# Patient Record
Sex: Female | Born: 1995 | Race: Black or African American | Hispanic: No | Marital: Married | Smoking: Former smoker
Health system: Southern US, Community
[De-identification: ages and names within clinical notes are randomized; demographics above are authoritative.]

## PROBLEM LIST (undated history)

## (undated) DIAGNOSIS — F419 Anxiety disorder, unspecified: Secondary | ICD-10-CM

## (undated) DIAGNOSIS — Z789 Other specified health status: Secondary | ICD-10-CM

## (undated) DIAGNOSIS — F4310 Post-traumatic stress disorder, unspecified: Secondary | ICD-10-CM

## (undated) DIAGNOSIS — N390 Urinary tract infection, site not specified: Secondary | ICD-10-CM

## (undated) DIAGNOSIS — F32A Depression, unspecified: Secondary | ICD-10-CM

## (undated) DIAGNOSIS — B009 Herpesviral infection, unspecified: Secondary | ICD-10-CM

## (undated) HISTORY — DX: Depression, unspecified: F32.A

## (undated) HISTORY — DX: Post-traumatic stress disorder, unspecified: F43.10

## (undated) HISTORY — DX: Herpesviral infection, unspecified: B00.9

## (undated) HISTORY — PX: HERNIA REPAIR: SHX51

## (undated) HISTORY — DX: Anxiety disorder, unspecified: F41.9

---
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
## 2017-07-01 ENCOUNTER — Emergency Department (HOSPITAL_COMMUNITY)
Admission: EM | Admit: 2017-07-01 | Discharge: 2017-07-01 | Disposition: A | Discharge status: Home / Self Care | Payer: Self-pay | Attending: Emergency Medicine | Admitting: Emergency Medicine

## 2017-07-01 ENCOUNTER — Other Ambulatory Visit: Payer: Self-pay

## 2017-07-01 ENCOUNTER — Encounter (HOSPITAL_COMMUNITY): Payer: Self-pay | Admitting: Emergency Medicine

## 2017-07-01 DIAGNOSIS — N3091 Cystitis, unspecified with hematuria: Secondary | ICD-10-CM | POA: Insufficient documentation

## 2017-07-01 LAB — URINALYSIS, ROUTINE W REFLEX MICROSCOPIC
BILIRUBIN URINE: NEGATIVE
GLUCOSE, UA: NEGATIVE mg/dL
KETONES UR: 5 mg/dL — AB
Nitrite: POSITIVE — AB
PROTEIN: 100 mg/dL — AB
Specific Gravity, Urine: 1.025 (ref 1.005–1.030)
pH: 6 (ref 5.0–8.0)

## 2017-07-01 LAB — POC URINE PREG, ED: Preg Test, Ur: NEGATIVE

## 2017-07-01 MED ORDER — PHENAZOPYRIDINE HCL 200 MG PO TABS
200.0000 mg | ORAL_TABLET | Freq: Three times a day (TID) | ORAL | Status: DC
  Administered 2017-07-01: 200 mg via ORAL
  Filled 2017-07-01: qty 1

## 2017-07-01 MED ORDER — SULFAMETHOXAZOLE-TRIMETHOPRIM 800-160 MG PO TABS: 1.0000 | ORAL_TABLET | Freq: Two times a day (BID) | ORAL | 0 refills | Status: AC

## 2017-07-01 MED ORDER — SULFAMETHOXAZOLE-TRIMETHOPRIM 800-160 MG PO TABS
1.0000 | ORAL_TABLET | Freq: Once | ORAL | Status: AC
  Administered 2017-07-01: 1 via ORAL
  Filled 2017-07-01: qty 1

## 2017-07-01 MED ORDER — PHENAZOPYRIDINE HCL 200 MG PO TABS: 200.0000 mg | ORAL_TABLET | Freq: Three times a day (TID) | ORAL | 0 refills | Status: DC

## 2017-07-01 NOTE — ED Provider Notes (Signed)
[facility]-EMERGENCY DEPT Provider Note   CSN: [id number] Arrival date & time: 07/01/17  2024     History   Chief Complaint Chief Complaint  Patient presents with  . Dysuria    HPI Brandi [name] is a 21 y.o. female who presents to the ED with dysuria and hematuria. Patient also c/o back pain. LMP 06/16/2017. UTI symptoms started 06/23/17 with frequency and then progressed to dysuria and hematuria. Vaginal d/c. No hx of STI's, never been pregnant. Patient states she is not concerned about STI's as [facility] [place] test [place] and they were negative.   HPI  History reviewed. No pertinent past medical history.  There are no active problems to display for this patient.   History reviewed. No pertinent surgical history.  OB History    No data available       Home Medications    Prior to Admission medications   Medication Sig Start Date End Date Taking? Authorizing Provider  phenazopyridine (PYRIDIUM) 200 MG tablet Take 1 tablet (200 mg total) by mouth 3 (three) times daily. 07/01/17   Janne NapoleonNeese, Cassadie Pankonin M, NP  sulfamethoxazole-trimethoprim (BACTRIM DS,SEPTRA DS) 800-160 MG tablet Take 1 tablet by mouth 2 (two) times daily for 7 days. 07/01/17 07/08/17  Janne NapoleonNeese, Barby Colvard M, NP    Family History No family history on file.  Social History Social History   Tobacco Use  . Smoking status: Not on file  Substance Use Topics  . Alcohol use: Not on file  . Drug use: Not on file     Allergies   Patient has no known allergies.   Review of Systems Review of Systems  Constitutional: Negative for chills and fever.  HENT: Negative.   Eyes: Negative for pain, redness and itching.  Respiratory: Negative for cough and wheezing.   Cardiovascular: Negative for chest pain.  Gastrointestinal: Positive for abdominal pain. Negative for nausea and vomiting.  Genitourinary: Positive for dysuria, frequency, hematuria, urgency and vaginal  discharge. Negative for difficulty urinating and dyspareunia.  Musculoskeletal: Negative for myalgias and neck stiffness.  Skin: Negative for rash.  Neurological: Negative for syncope and headaches.  Psychiatric/Behavioral: Negative for confusion. The patient is not nervous/anxious.      Physical Exam Updated Vital Signs BP (!) 146/67 (BP Location: Right Arm)   Pulse 80   Temp 98.7 F (37.1 C) (Oral)   Resp 18   Ht 5\' 4"  (1.626 m)   Wt 66.7 kg (147 lb)   LMP 06/22/2017   SpO2 100%   BMI 25.23 kg/m   Physical Exam  Constitutional: She is oriented to person, place, and time. She appears well-developed and well-nourished. No distress.  HENT:  Head: Normocephalic and atraumatic.  Eyes: Conjunctivae and EOM are normal. Pupils are equal, round, and reactive to light.  Neck: Normal range of motion. Neck supple.  Cardiovascular: Normal rate and regular rhythm.  Pulmonary/Chest: Effort normal and breath sounds normal.  Abdominal: Soft. There is tenderness in the suprapubic area. There is no rebound, no guarding and no CVA tenderness.  Genitourinary:  Genitourinary Comments: Patient declined pelvic exam stating she recently had exam and all STI testing was negative.   Musculoskeletal: Normal range of motion.  Neurological: She is alert and oriented to person, place, and time. No cranial nerve deficit.  Skin: Skin is warm and dry.  Psychiatric: She has a normal mood and affect. Her behavior is normal.  Nursing note and vitals reviewed.  ED Treatments / Results  Labs (all labs ordered are listed, but only abnormal results are displayed) Labs Reviewed  URINALYSIS, ROUTINE W REFLEX MICROSCOPIC - Abnormal; Notable for the following components:      Result Value   APPearance CLOUDY (*)    Hgb urine dipstick LARGE (*)    Ketones, ur 5 (*)    Protein, ur 100 (*)    Nitrite POSITIVE (*)    Leukocytes, UA LARGE (*)    Bacteria, UA RARE (*)    Squamous Epithelial / LPF 6-30 (*)     All other components within normal limits  URINE CULTURE  POC URINE PREG, ED    Radiology No results found.  Procedures Procedures (including critical care time)  Medications Ordered in ED Medications  sulfamethoxazole-trimethoprim (BACTRIM DS,SEPTRA DS) 800-160 MG per tablet 1 tablet (not administered)  phenazopyridine (PYRIDIUM) tablet 200 mg (not administered)     Initial Impression / Assessment and Plan / ED Course  I have reviewed the triage vital signs and the nursing notes. Pt has been diagnosed with a UTI. Pt is afebrile, no CVA tenderness, normotensive, and denies N/V. Pt to be dc home with antibiotics and instructions to follow up with PCP if symptoms persist. Urine sent for culture.   Final Clinical Impressions(s) / ED Diagnoses   Final diagnoses:  Cystitis with hematuria    ED Discharge Orders        Ordered    sulfamethoxazole-trimethoprim (BACTRIM DS,SEPTRA DS) 800-160 MG tablet  2 times daily     07/01/17 2200    phenazopyridine (PYRIDIUM) 200 MG tablet  3 times daily     07/01/17 2200       Kerrie Buffaloeese, Elex Mainwaring ChalmetteM, TexasNP 07/01/17 2203    Rolan BuccoBelfi, Melanie, MD 07/01/17 2244

## 2017-07-01 NOTE — Discharge Instructions (Signed)
Take the medication as directed. The medication for bladder spasm will cause your urine to be an orange color. We have sent the urine for culture. If we need to change your medication someone will call you. Follow up with your doctor. Return here as needed.

## 2017-07-01 NOTE — ED Notes (Signed)
Pt is alert and oriented x 4 and is verbally responsive, Pt presents with significant other and reports having Abdominal discomfort, and pain and burning with urination. Pt has hx of UTI.

## 2017-07-01 NOTE — ED Triage Notes (Signed)
Patient c/o urinary frequency with blood tinged urine x1 day. Patient also c/o lower back pain and pain with urination. Denies N/V/D. Hx UTI.

## 2017-07-04 LAB — URINE CULTURE: Culture: >=100000 — AB

## 2017-07-05 ENCOUNTER — Telehealth: Payer: Self-pay | Admitting: *Deleted

## 2017-07-05 NOTE — Telephone Encounter (Signed)
Post ED Visit - Positive Culture Follow-up  Culture report reviewed by antimicrobial stewardship pharmacist:  []  Enzo BiNathan Batchelder, Pharm.D. []  Celedonio MiyamotoJeremy Frens, Pharm.D., BCPS AQ-ID []  Garvin FilaMike Maccia, Pharm.D., BCPS []  Georgina PillionElizabeth Martin, [address] BoulevardPharm.D., BCPS []  Homewood CanyonMinh Pham, [place]arm.D., BCPS, AAHIVP []  Estella HuskMichelle Turner, Pharm.D., BCPS, AAHIVP []  Lysle Pearlachel Rumbarger, PharmD, BCPS []  Blake DivineShannon Parkey, PharmD []  Pollyann SamplesAndy Johnston, PharmD, BCPS Dimple NanasShannon Parker, PharmD   Positive urine culture Treated with Sulfamethoxazole-Trimethoprim, organism sensitive to the same and no further patient follow-up is required at this time.  Virl AxeRobertson, Tamarah Bhullar [facility] 07/05/2017, 11:07 AM

## 2018-02-23 ENCOUNTER — Encounter (HOSPITAL_COMMUNITY): Payer: Self-pay | Admitting: Emergency Medicine

## 2018-02-23 ENCOUNTER — Emergency Department (HOSPITAL_COMMUNITY)
Admission: EM | Admit: 2018-02-23 | Discharge: 2018-02-23 | Disposition: A | Discharge status: Home / Self Care | Payer: Medicaid Other | Attending: Emergency Medicine | Admitting: Emergency Medicine

## 2018-02-23 DIAGNOSIS — R21 Rash and other nonspecific skin eruption: Secondary | ICD-10-CM

## 2018-02-23 DIAGNOSIS — Z79899 Other long term (current) drug therapy: Secondary | ICD-10-CM | POA: Insufficient documentation

## 2018-02-23 NOTE — Discharge Instructions (Addendum)
Apply cream as prescribed, apply dressing over area and see if this helps clear 1 area at a time. Follow up with dermatology. Take a Zinc supplement.

## 2018-02-23 NOTE — ED Provider Notes (Signed)
[facility]-EMERGENCY DEPT Provider Note   CSN: [id number] Arrival date & time: 02/23/18  [id number]     History   Chief Complaint Chief Complaint  Patient presents with  . Rash    HPI Brandi [name] is a 22 y.o. female.  22 year old female presents with complaint of rash x1 to 2 months.  Patient first noticed rash to her right medial thigh area, sent pictures to an over the phone physician consult service and was diagnosed with ringworm, prescribed miconazole cream.  Patient has been applying the miconazole cream to her thigh lesions and states they seem to be improving however she now has spots on her neck, back, left thigh.  Patient consulted with service again yesterday and was given prescription for ketoconazole cream to use in place of the miconazole cream.  Patient thought the rash initially started when she was splashed on the leg with bleach, reports redness and itching on her thigh where the bleach had touched her skin.  Redness has resolved however patient still has the rash. No other complaints or concerns.      History reviewed. No pertinent past medical history.  There are no active problems to display for this patient.   History reviewed. No pertinent surgical history.   OB History   None      Home Medications    Prior to Admission medications   Medication Sig Start Date End Date Taking? Authorizing Provider  ketoconazole (NIZORAL) 2 % cream Apply 1 application topically daily.   Yes [provider]  miconazole (MICOTIN) 2 % cream Apply 1 application topically 2 (two) times daily.   Yes [provider]  Prenatal Vit-Fe Fumarate-FA (PRENATAL MULTIVITAMIN) TABS tablet Take 1 tablet by mouth daily at 12 noon.   Yes [provider]    Family History No family history on file.  Social History Social History   Tobacco Use  . Smoking status: Never Smoker  . Smokeless tobacco: Never Used  Substance Use Topics  .  Alcohol use: Never    Frequency: Never  . Drug use: Never     Allergies   Patient has no known allergies.   Review of Systems Review of Systems  Constitutional: Negative for fever.  Musculoskeletal: Negative for arthralgias, joint swelling and myalgias.  Skin: Positive for rash. Negative for wound.  Allergic/Immunologic: Negative for immunocompromised state.  Neurological: Negative for weakness.  Hematological: Negative for adenopathy. Does not bruise/bleed easily.  All other systems reviewed and are negative.    Physical Exam Updated Vital Signs BP 140/75 (BP Location: Right Arm)   Pulse 72   Resp 18   SpO2 99%   Physical Exam  Constitutional: She is oriented to person, place, and time. She appears well-developed and well-nourished. No distress.  HENT:  Head: Normocephalic and atraumatic.  Cardiovascular: Intact distal pulses.  Pulmonary/Chest: Effort normal.  Musculoskeletal: She exhibits no edema, tenderness or deformity.  Neurological: She is alert and oriented to person, place, and time.  Skin: Skin is warm and dry. Rash noted. She is not diaphoretic.     Psychiatric: She has a normal mood and affect. Her behavior is normal.  Nursing note and vitals reviewed.    ED Treatments / Results  Labs (all labs ordered are listed, but only abnormal results are displayed) Labs Reviewed - No data to display  EKG None  Radiology No results found.  Procedures Procedures (including critical care time)  Medications Ordered in ED Medications - No data to  display   Initial Impression / Assessment and Plan / ED Course  I have reviewed the triage vital signs and the nursing notes.  Pertinent labs & imaging results that were available during my care of the patient were reviewed by me and considered in my medical decision making (see chart for details).  Clinical Course as of Feb 24 1107  Sat Feb 23, 2018  1106 21yo female with rash to right leg, initially  diagnosed as ring worm and applying antifungal cream. Question satellite lesions to left thigh, neck/back. Lesions do appear fungal, scaling noted with scalloped border, no erythema. Recommend she continue with the Ketoconazole area, apply an occlusive dressing on 1 lesion to see if this helps clear the area. Referral to dermatology.    [LM]    Clinical Course User Index [LM] Jeannie Fend, PA-C    Final Clinical Impressions(s) / ED Diagnoses   Final diagnoses:  Rash    ED Discharge Orders    None       Alden Hipp 02/23/18 1108    Donnetta Hutching, MD 02/24/18 [phone]

## 2018-02-23 NOTE — ED Triage Notes (Signed)
Patient here from home with complaints of body rash. Ointment with no relief. Reports that is started off as ringworm on right inner thigh.

## 2019-02-24 ENCOUNTER — Ambulatory Visit: Payer: Medicaid Other | Admitting: Internal Medicine

## 2019-08-24 ENCOUNTER — Other Ambulatory Visit: Payer: Self-pay

## 2019-08-24 ENCOUNTER — Encounter (HOSPITAL_COMMUNITY): Payer: Self-pay | Admitting: Emergency Medicine

## 2019-08-24 ENCOUNTER — Emergency Department (HOSPITAL_COMMUNITY): Payer: PRIVATE HEALTH INSURANCE

## 2019-08-24 ENCOUNTER — Emergency Department (HOSPITAL_COMMUNITY)
Admission: AD | Admit: 2019-08-24 | Discharge: 2019-08-24 | Disposition: A | Discharge status: Other Acute Inpatient Hospital | Payer: PRIVATE HEALTH INSURANCE | Attending: Emergency Medicine | Admitting: Emergency Medicine

## 2019-08-24 DIAGNOSIS — O209 Hemorrhage in early pregnancy, unspecified: Secondary | ICD-10-CM | POA: Insufficient documentation

## 2019-08-24 DIAGNOSIS — O99891 Other specified diseases and conditions complicating pregnancy: Secondary | ICD-10-CM | POA: Diagnosis not present

## 2019-08-24 DIAGNOSIS — Z79899 Other long term (current) drug therapy: Secondary | ICD-10-CM | POA: Insufficient documentation

## 2019-08-24 DIAGNOSIS — R102 Pelvic and perineal pain: Secondary | ICD-10-CM | POA: Diagnosis not present

## 2019-08-24 DIAGNOSIS — O3680X0 Pregnancy with inconclusive fetal viability, not applicable or unspecified: Secondary | ICD-10-CM | POA: Diagnosis not present

## 2019-08-24 DIAGNOSIS — Z113 Encounter for screening for infections with a predominantly sexual mode of transmission: Secondary | ICD-10-CM | POA: Insufficient documentation

## 2019-08-24 DIAGNOSIS — R109 Unspecified abdominal pain: Secondary | ICD-10-CM

## 2019-08-24 DIAGNOSIS — N939 Abnormal uterine and vaginal bleeding, unspecified: Secondary | ICD-10-CM

## 2019-08-24 DIAGNOSIS — O26891 Other specified pregnancy related conditions, first trimester: Secondary | ICD-10-CM

## 2019-08-24 DIAGNOSIS — Z3A01 Less than 8 weeks gestation of pregnancy: Secondary | ICD-10-CM | POA: Diagnosis not present

## 2019-08-24 HISTORY — DX: Other specified health status: Z78.9

## 2019-08-24 LAB — CBC
HCT: 41.1 % (ref 36.0–46.0)
Hemoglobin: 14.2 g/dL (ref 12.0–15.0)
MCH: 30.2 pg (ref 26.0–34.0)
MCHC: 34.5 g/dL (ref 30.0–36.0)
MCV: 87.4 fL (ref 80.0–100.0)
Platelets: 272 10*3/uL (ref 150–400)
RBC: 4.7 MIL/uL (ref 3.87–5.11)
RDW: 12.2 % (ref 11.5–15.5)
WBC: 6.5 10*3/uL (ref 4.0–10.5)
nRBC: 0 % (ref 0.0–0.2)

## 2019-08-24 LAB — HCG, QUANTITATIVE, PREGNANCY: hCG, Beta Chain, Quant, S: 65 m[IU]/mL — ABNORMAL HIGH (ref <5)

## 2019-08-24 LAB — URINALYSIS, ROUTINE W REFLEX MICROSCOPIC
Bilirubin Urine: NEGATIVE
Glucose, UA: NEGATIVE mg/dL
Ketones, ur: NEGATIVE mg/dL
Leukocytes,Ua: NEGATIVE
Nitrite: NEGATIVE
Protein, ur: NEGATIVE mg/dL
Specific Gravity, Urine: 1.011 (ref 1.005–1.030)
pH: 7 (ref 5.0–8.0)

## 2019-08-24 LAB — WET PREP, GENITAL
Clue Cells Wet Prep HPF POC: NONE SEEN
Sperm: NONE SEEN
Trich, Wet Prep: NONE SEEN
Yeast Wet Prep HPF POC: NONE SEEN

## 2019-08-24 LAB — ABO/RH: ABO/RH(D): O POS

## 2019-08-24 LAB — POC URINE PREG, ED: Preg Test, Ur: POSITIVE — AB

## 2019-08-24 MED ORDER — PROMETHAZINE HCL 25 MG/ML IJ SOLN
12.5000 mg | Freq: Once | INTRAMUSCULAR | Status: AC
  Administered 2019-08-24: 09:00:00 12.5 mg via INTRAMUSCULAR
  Filled 2019-08-24: qty 1

## 2019-08-24 MED ORDER — HYDROMORPHONE HCL 1 MG/ML IJ SOLN
1.0000 mg | Freq: Once | INTRAMUSCULAR | Status: AC
  Administered 2019-08-24: 1 mg via INTRAMUSCULAR
  Filled 2019-08-24: qty 1

## 2019-08-24 NOTE — MAU Note (Signed)
Pt reports to mau from [facility] with c/o lower abd cramping and vag bleeding since waking up this morning.  Pt denies recent intercourse.

## 2019-08-24 NOTE — ED Triage Notes (Signed)
Pt reports positive pregnancy test on 2/15. Reports light spotting since the 15th but noticed increase in bleeding this morning. Endorses abd cramping.

## 2019-08-24 NOTE — Discharge Instructions (Signed)
  Return to care   If you have heavier bleeding that soaks through more that 2 pads per hour for an hour or more  If you bleed so much that you feel like you might pass out or you do pass out  If you have significant abdominal pain that is not improved with Tylenol   If you develop a fever > 100.5     Vaginal Bleeding During Pregnancy, First Trimester  A small amount of bleeding from the vagina (spotting) is relatively common during early pregnancy. It usually stops on its own. Various things may cause bleeding or spotting during early pregnancy. Some bleeding may be related to the pregnancy, and some may not. In many cases, the bleeding is normal and is not a problem. However, bleeding can also be a sign of something serious. Be sure to tell your health care provider about any vaginal bleeding right away. Some possible causes of vaginal bleeding during the first trimester include:  Infection or inflammation of the cervix.  Growths (polyps) on the cervix.  Miscarriage or threatened miscarriage.  Pregnancy tissue developing outside of the uterus (ectopic pregnancy).  A mass of tissue developing in the uterus due to an egg being fertilized incorrectly (molar pregnancy). Follow these instructions at home: Activity  Follow instructions from your health care provider about limiting your activity. Ask what activities are safe for you.  If needed, make plans for someone to help with your regular activities.  Do not have sex or orgasms until your health care provider says that this is safe. General instructions  Take over-the-counter and prescription medicines only as told by your health care provider.  Pay attention to any changes in your symptoms.  Do not use tampons or douche.  Write down how many pads you use each day, how often you change pads, and how soaked (saturated) they are.  If you pass any tissue from your vagina, save the tissue so you can show it to your health  care provider.  Keep all follow-up visits as told by your health care provider. This is important. Contact a health care provider if:  You have vaginal bleeding during any part of your pregnancy.  You have cramps or labor pains.  You have a fever. Get help right away if:  You have severe cramps in your back or abdomen.  You pass large clots or a large amount of tissue from your vagina.  Your bleeding increases.  You feel light-headed or weak, or you faint.  You have chills.  You are leaking fluid or have a gush of fluid from your vagina. Summary  A small amount of bleeding (spotting) from the vagina is relatively common during early pregnancy.  Various things may cause bleeding or spotting in early pregnancy.  Be sure to tell your health care provider about any vaginal bleeding right away. This information is not intended to replace advice given to you by your health care provider. Make sure you discuss any questions you have with your health care provider. Document Revised: 10/08/2018 Document Reviewed: 09/21/2016 Elsevier Patient Education  [place].

## 2019-08-24 NOTE — ED Provider Notes (Signed)
[facility] EMERGENCY DEPARTMENT Provider Note   CSN: [id number] Arrival date & time: 08/24/19  7846     History Chief Complaint  Patient presents with  . Vaginal Bleeding    Brandi [name] is a 24 y.o. female.  Presents with vaginal bleeding.  G 1P0 at 6 weeks by LMP.  Yesterday had some lower pelvic cramping, similar to.  Cramping intermittently in the evening.  This morning, she had one episode of vaginal bleeding.  Did not notice any clots.  Has not had recurrent bleeding since that episode early this morning.  Still having some intermittent cramping but this more mild.  Denies medical problems, denies any allergies to medications.   HPI     History reviewed. No pertinent past medical history.  There are no problems to display for this patient.   History reviewed. No pertinent surgical history.   OB History   No obstetric history on file.     No family history on file.  Social History   Tobacco Use  . Smoking status: Never Smoker  . Smokeless tobacco: Never Used  Substance Use Topics  . Alcohol use: Never  . Drug use: Never    Home Medications Prior to Admission medications   Medication Sig Start Date End Date Taking? Authorizing Provider  Prenatal Vit-Fe Fumarate-FA (PRENATAL MULTIVITAMIN) TABS tablet Take 1 tablet by mouth daily at 12 noon.   Yes [provider]  ketoconazole (NIZORAL) 2 % cream Apply 1 application topically daily.    [provider]  miconazole (MICOTIN) 2 % cream Apply 1 application topically 2 (two) times daily.    [provider]    Allergies    Patient has no known allergies.  Review of Systems   Review of Systems  Constitutional: Negative for chills and fever.  HENT: Negative for ear pain and sore throat.   Eyes: Negative for pain and visual disturbance.  Respiratory: Negative for cough and shortness of breath.   Cardiovascular: Negative for chest pain and palpitations.   Gastrointestinal: Positive for abdominal pain. Negative for vomiting.  Genitourinary: Positive for vaginal bleeding. Negative for dysuria and hematuria.  Musculoskeletal: Negative for arthralgias and back pain.  Skin: Negative for color change and rash.  Neurological: Negative for seizures and syncope.  All other systems reviewed and are negative.   Physical Exam Updated Vital Signs BP (!) 134/96   Pulse 84   Temp 98.4 F (36.9 C) (Oral)   Resp 16   SpO2 100%   Physical Exam Vitals and nursing note reviewed.  Constitutional:      General: She is not in acute distress.    Appearance: She is well-developed.  HENT:     Head: Normocephalic and atraumatic.  Eyes:     Conjunctiva/sclera: Conjunctivae normal.  Cardiovascular:     Rate and Rhythm: Normal rate and regular rhythm.     Heart sounds: No murmur.  Pulmonary:     Effort: Pulmonary effort is normal. No respiratory distress.     Breath sounds: Normal breath sounds.  Abdominal:     General: Abdomen is flat.     Palpations: Abdomen is soft. There is no mass.     Tenderness: There is no abdominal tenderness.  Musculoskeletal:     Cervical back: Neck supple.  Skin:    General: Skin is warm and dry.     Capillary Refill: Capillary refill takes less than 2 seconds.  Neurological:     General: No focal deficit  present.     Mental Status: She is alert and oriented to person, place, and time.     ED Results / Procedures / Treatments   Labs (all labs ordered are listed, but only abnormal results are displayed) Labs Reviewed  POC URINE PREG, ED    EKG None  Radiology No results found.  Procedures Procedures (including critical care time)  Medications Ordered in ED Medications - No data to display  ED Course  I have reviewed the triage vital signs and the nursing notes.  Pertinent labs & imaging results that were available during my care of the patient were reviewed by me and considered in my medical  decision making (see chart for details).  Clinical Course as of Aug 23 744  Sun Aug 24, 2019  0738 First trimester vaginal bleeding, will discuss with MAU   [RD]    Clinical Course User Index [RD] Lucrezia Starch, MD   MDM Rules/Calculators/A&P                      24 year old female G1, P0 at 6 weeks by LMP presented to ER with intermittent pelvic cramping, episode of vaginal bleeding.  Has not had any prenatal work-up, no prior ultrasound.  Will need additional work-up including ultrasound, Rh testing.  Discussed with MAU provider.  Janett Billow CNM will assume care.  Final Clinical Impression(s) / ED Diagnoses Final diagnoses:  Vaginal bleeding    Rx / DC Orders ED Discharge Orders    None       Lucrezia Starch, MD 08/24/19 [phone]

## 2019-08-24 NOTE — MAU Provider Note (Signed)
Chief Complaint: Vaginal Bleeding and Abdominal Pain   First Provider Initiated Contact with Patient 08/24/19 [phone]     SUBJECTIVE HPI: Brandi [name] is a 24 y.o. G1P0 at [redacted]w[redacted]d who presents to Maternity Admissions reporting abdominal cramping & vaginal bleeding. Lower abdominal cramping started late last night. This morning noticed brown/pink spotting on toilet paper. Not saturating pads or passing blood clots. Denies n/v/d, constipation, dysuria, vaginal discharge, or recent intercourse. Has appointment with [facility] tomorrow but has not been seen anywhere yet with this pregnancy.   Location: abdomen Quality: cramping Severity: 5/10 on pain scale Duration: 1 day Timing: intermittent Modifying factors: none Associated signs and symptoms: vaginal bleeding  Past Medical History:  Diagnosis Date  . Medical history non-contributory    OB History  Gravida Para Term Preterm AB Living  1            SAB TAB Ectopic Multiple Live Births               # Outcome Date GA Lbr Len/2nd Weight Sex Delivery Anes PTL Lv  1 Current            Past Surgical History:  Procedure Laterality Date  . HERNIA REPAIR     Social History   Socioeconomic History  . Marital status: Married    Spouse name: Not on file  . Number of children: Not on file  . Years of education: Not on file  . Highest education level: Not on file  Occupational History  . Not on file  Tobacco Use  . Smoking status: Never Smoker  . Smokeless tobacco: Never Used  Substance and Sexual Activity  . Alcohol use: Never  . Drug use: Never  . Sexual activity: Yes    Birth control/protection: None  Other Topics Concern  . Not on file  Social History Narrative  . Not on file   Social Determinants of Health   Financial Resource Strain:   . Difficulty of Paying Living Expenses: Not on file  Food Insecurity:   . Worried About Charity fundraiser in the Last Year: Not on file  . Ran Out of Food in the Last Year: Not on file   Transportation Needs:   . Lack of Transportation (Medical): Not on file  . Lack of Transportation (Non-Medical): Not on file  Physical Activity:   . Days of Exercise per Week: Not on file  . Minutes of Exercise per Session: Not on file  Stress:   . Feeling of Stress : Not on file  Social Connections:   . Frequency of Communication with Friends and Family: Not on file  . Frequency of Social Gatherings with Friends and Family: Not on file  . Attends Religious Services: Not on file  . Active Member of Clubs or Organizations: Not on file  . Attends Archivist Meetings: Not on file  . Marital Status: Not on file  Intimate Partner Violence:   . Fear of Current or Ex-Partner: Not on file  . Emotionally Abused: Not on file  . Physically Abused: Not on file  . Sexually Abused: Not on file   History reviewed. No pertinent family history. No current facility-administered medications on file prior to encounter.   Current Outpatient Medications on File Prior to Encounter  Medication Sig Dispense Refill  . Prenatal Vit-Fe Fumarate-FA (PRENATAL MULTIVITAMIN) TABS tablet Take 1 tablet by mouth daily at 12 noon.    Marland Kitchen ketoconazole (NIZORAL) 2 % cream Apply 1 application topically daily.    Marland Kitchen  miconazole (MICOTIN) 2 % cream Apply 1 application topically 2 (two) times daily.     No Known Allergies  I have reviewed patient's Past Medical Hx, Surgical Hx, Family Hx, Social Hx, medications and allergies.   Review of Systems  Constitutional: Negative.   Gastrointestinal: Positive for abdominal pain.  Genitourinary: Positive for vaginal bleeding.    OBJECTIVE Patient Vitals for the past 24 hrs:  BP Temp Temp src Pulse Resp SpO2  08/24/19 0819 (!) 142/92 98.4 F (36.9 C) Oral 66 -- --  08/24/19 0816 -- -- -- -- -- 100 %  08/24/19 0721 (!) 134/96 98.4 F (36.9 C) Oral 84 16 100 %   Constitutional: Well-developed, well-nourished female in no acute distress.  Cardiovascular: normal  rate & rhythm, no murmur Respiratory: normal rate and effort. Lung sounds clear throughout GI: Abd soft, non-tender, Pos BS x 4. No guarding or rebound tenderness MS: Extremities nontender, no edema, normal ROM Neurologic: Alert and oriented x 4.  GU:     SPECULUM EXAM: NEFG, small to moderate amount of dark red blood. Cervix pink/smooth/not friable  BIMANUAL: No CMT. cervix closed; uterus normal size, no adnexal tenderness or masses.    LAB RESULTS Results for orders placed or performed during the hospital encounter of 08/24/19 (from the past 24 hour(s))  POC Urine Pregnancy, ED (not at [facility])     Status: Abnormal   Collection Time: 08/24/19  7:46 AM  Result Value Ref Range   Preg Test, Ur POSITIVE (A) NEGATIVE  Wet prep, genital     Status: Abnormal   Collection Time: 08/24/19  8:40 AM   Specimen: Cervix  Result Value Ref Range   Yeast Wet Prep HPF POC NONE SEEN NONE SEEN   Trich, Wet Prep NONE SEEN NONE SEEN   Clue Cells Wet Prep HPF POC NONE SEEN NONE SEEN   WBC, Wet Prep HPF POC MODERATE (A) NONE SEEN   Sperm NONE SEEN   CBC     Status: None   Collection Time: 08/24/19  8:48 AM  Result Value Ref Range   WBC 6.5 4.0 - 10.5 K/uL   RBC 4.70 3.87 - 5.11 MIL/uL   Hemoglobin 14.2 12.0 - 15.0 g/dL   HCT 84.6 96.2 - 95.2 %   MCV 87.4 80.0 - 100.0 fL   MCH 30.2 26.0 - 34.0 pg   MCHC 34.5 30.0 - 36.0 g/dL   RDW 84.1 32.4 - 40.1 %   Platelets 272 150 - 400 K/uL   nRBC 0.0 0.0 - 0.2 %  ABO/Rh     Status: None (Preliminary result)   Collection Time: 08/24/19  8:48 AM  Result Value Ref Range   ABO/RH(D) O POS    No rh immune globuloin      NOT A RH IMMUNE GLOBULIN CANDIDATE, PT RH POSITIVE Performed at [facility], [address], [place], [place] [address]   hCG, quantitative, pregnancy     Status: Abnormal   Collection Time: 08/24/19  8:48 AM  Result Value Ref Range   hCG, Beta Chain, Quant, S 65 (H) <5 mIU/mL  Urinalysis, Routine w reflex microscopic     Status:  Abnormal   Collection Time: 08/24/19 10:18 AM  Result Value Ref Range   Color, Urine YELLOW YELLOW   APPearance HAZY (A) CLEAR   Specific Gravity, Urine 1.011 1.005 - 1.030   pH 7.0 5.0 - 8.0   Glucose, UA NEGATIVE NEGATIVE mg/dL   Hgb urine dipstick LARGE (A)  NEGATIVE   Bilirubin Urine NEGATIVE NEGATIVE   Ketones, ur NEGATIVE NEGATIVE mg/dL   Protein, ur NEGATIVE NEGATIVE mg/dL   Nitrite NEGATIVE NEGATIVE   Leukocytes,Ua NEGATIVE NEGATIVE   RBC / HPF 11-20 0 - 5 RBC/hpf   WBC, UA 6-10 0 - 5 WBC/hpf   Bacteria, UA FEW (A) NONE SEEN   Squamous Epithelial / LPF 0-5 0 - 5   Mucus PRESENT     IMAGING US OB LESS THAN 14 WEEKS WITH OB TRANSVAGINAL  Result Date: 08/24/2019 CLINICAL DATA:  Pregnant, vaginal bleeding, beta HCG 65 EXAM: OBSTETRIC <14 WK [place] AND TRANSVAGINAL OB US TECHNIQUE: Both transabdominal and transvaginal ultrasound examinations were performed for complete evaluation of the gestation as well as the maternal uterus, adnexal regions, and pelvic cul-de-sac. Transvaginal technique was performed to assess early pregnancy. COMPARISON:  None. FINDINGS: Intrauterine gestational sac: None Maternal uterus/adnexae: 13 x 8 x 14 mm subserosal fibroid in the right posterior uterine body. 13 x 6 x 9 mm subserosal fibroid in the left posterior uterine body. Endometrial complex measures 11 mm. Left ovary is within normal limits. On or adjacent to the right ovary is a 14 x 11 x 11 mm echogenic ring like lesion with peripheral vascularity (image 48). This is unable to be separated from the ovary on the provided images. If on the ovary, this would likely reflect a corpus luteum, which is statistically favored. If adjacent/distinct from the ovary, this would be worrisome for an ectopic pregnancy. Small volume pelvic ascites. IMPRESSION: No IUP is visualized. This is not unexpected given the low beta HCG. By definition, in the setting of a positive pregnancy test, this reflects a pregnancy of unknown  location. Differential considerations include early normal IUP, abnormal IUP/missed abortion, or nonvisualized ectopic pregnancy. Serial beta HCG is suggested. Consider routine follow-up pelvic ultrasound in 14 days, or earlier as clinically warranted. Specifically, in this patient, there is a right adnexal lesion which statistically favors a corpus luteum. However, if adjacent/distinct from the right ovary (which could not be proven on this study), this would be worrisome for ectopic pregnancy. As such, short-term follow-up pelvic ultrasound in 3-5 days could be considered if there is inappropriate elevation of beta HCG or continued/worsening pain. Electronically Signed   By: Charline Bills M.D.   On: 08/24/2019 10:42    MAU COURSE Orders Placed This Encounter  Procedures  . Wet prep, genital  . US OB LESS THAN 14 WEEKS WITH OB TRANSVAGINAL  . CBC  . hCG, quantitative, pregnancy  . Urinalysis, Routine w reflex microscopic  . POC Urine Pregnancy, ED (not at [facility])  . ABO/Rh  . Discharge patient   Meds ordered this encounter  Medications  . HYDROmorphone (DILAUDID) injection 1 mg  . promethazine (PHENERGAN) injection 12.5 mg    MDM +UPT UA, wet prep, GC/chlamydia, CBC, ABO/Rh, quant hCG, and [place] today to rule out ectopic pregnancy which can be life threatening.   RH positive  Patient given dilaudid prior to ultrasound - states pain feels like bad period cramps. Given option to wait for tylenol after ultrasound but prefers being medicated prior to. Pain down to 2/10 after medication.   Ultrasound shows no IUP. Lesion on or near right ovary - corpus luteus cyst versus ectopic pregnancy. HCG today is 65.   Discussed with Dr. Shawnie Pons. Patient is stable & this is a desired pregnancy. Will repeat HCG on Tuesday.   VSS. Patient reports minimal pain at time of discharge. Abdomen soft &  non tender. Reviewed results with patient. Concerned this is not a normal pregnancy due to low HCG 1 week  after a positive UPT. Can't exclude ectopic pregnancy at this time. Reviewed strict return precautions.   ASSESSMENT 1. Pregnancy of unknown anatomic location   2. Vaginal bleeding   3. Vaginal bleeding in pregnancy, first trimester   4. Abdominal pain during pregnancy in first trimester     PLAN Discharge home in stable condition. SAB vs ectopic precautions Scheduled for stat HCG at [facility] on Tuesday (patient has new ob at [facility], but has never been seen there and doesn't want to go there for follow up) GC/CT pending OTC tylenol prn pain  Follow-up Information    [facility] Follow up.   Specialty: Obstetrics and Gynecology Why: return for worsening symptoms Contact information: [address] [id number] [place] [place] [address] [phone]         Allergies as of 08/24/2019   No Known Allergies     Medication List    STOP taking these medications   ketoconazole 2 % cream Commonly known as: NIZORAL   miconazole 2 % cream Commonly known as: MICOTIN     TAKE these medications   prenatal multivitamin Tabs tablet Take 1 tablet by mouth daily at 12 noon.        Judeth Horn, NP 08/24/2019  11:18 AM

## 2019-08-25 LAB — GC/CHLAMYDIA PROBE AMP (~~LOC~~) NOT AT ARMC
Chlamydia: NEGATIVE
Comment: NEGATIVE
Comment: NORMAL
Neisseria Gonorrhea: NEGATIVE

## 2019-08-26 ENCOUNTER — Other Ambulatory Visit: Payer: PRIVATE HEALTH INSURANCE

## 2019-08-26 ENCOUNTER — Other Ambulatory Visit (INDEPENDENT_AMBULATORY_CARE_PROVIDER_SITE_OTHER): Payer: PRIVATE HEALTH INSURANCE | Admitting: *Deleted

## 2019-08-26 ENCOUNTER — Other Ambulatory Visit: Payer: Self-pay

## 2019-08-26 DIAGNOSIS — O039 Complete or unspecified spontaneous abortion without complication: Secondary | ICD-10-CM

## 2019-08-26 LAB — BETA HCG QUANT (REF LAB): hCG Quant: 26 m[IU]/mL

## 2019-08-26 NOTE — Progress Notes (Addendum)
   Brandi [name] presents to [facility] for follow-up quant hCG blood draw today. She was seen in MAU for abdominal pain and vaginal bleeding on 08/24/2019. Patient denies/endorses pain or bleeding today. Discussed with patient, we are following hCG levels today.  Valid contact number for patient confirmed. I will call the patient with results.   Telephone call to patient regarding beta Hcg level. Informed patient that level has dropped to 26 from 65 on 08/24/2019. Patient made aware that she will need to have a 2 week follow up for miscarriage in 2 weeks. Patient is still having some bleeding and cramping. Advised patient to go back to MAU if she is bleeding heavy (soaking more than 1 pad/tampoon a hour) and/or having severe pelvic/abdominal pain.   Clovis Pu 08/26/2019 9:34 AM

## 2019-08-27 ENCOUNTER — Encounter: Payer: Self-pay | Admitting: Certified Nurse Midwife

## 2019-09-10 ENCOUNTER — Encounter: Payer: Self-pay | Admitting: Certified Nurse Midwife

## 2019-09-10 ENCOUNTER — Other Ambulatory Visit: Payer: Self-pay

## 2019-09-10 ENCOUNTER — Ambulatory Visit (INDEPENDENT_AMBULATORY_CARE_PROVIDER_SITE_OTHER): Payer: PRIVATE HEALTH INSURANCE | Admitting: Certified Nurse Midwife

## 2019-09-10 VITALS — BP 121/78 | HR 67 | Temp 98.5°F | Wt 155.4 lb

## 2019-09-10 DIAGNOSIS — R030 Elevated blood-pressure reading, without diagnosis of hypertension: Secondary | ICD-10-CM

## 2019-09-10 DIAGNOSIS — O039 Complete or unspecified spontaneous abortion without complication: Secondary | ICD-10-CM

## 2019-09-10 NOTE — Patient Instructions (Signed)
Preventing Hypertension Hypertension, commonly called high blood pressure, is when the force of blood pumping through the arteries is too strong. Arteries are blood vessels that carry blood from the heart throughout the body. Over time, hypertension can damage the arteries and decrease blood flow to important parts of the body, including the brain, heart, and kidneys. Often, hypertension does not cause symptoms until blood pressure is very high. For this reason, it is important to have your blood pressure checked on a regular basis. Hypertension can often be prevented with diet and lifestyle changes. If you already have hypertension, you can control it with diet and lifestyle changes, as well as medicine. What nutrition changes can be made? Maintain a healthy diet. This includes:  Eating less salt (sodium). Ask your health care provider how much sodium is safe for you to have. The general recommendation is to consume less than 1 tsp (2,300 mg) of sodium a day. ? Do not add salt to your food. ? Choose low-sodium options when grocery shopping and eating out.  Limiting fats in your diet. You can do this by eating low-fat or fat-free dairy products and by eating less red meat.  Eating more fruits, vegetables, and whole grains. Make a goal to eat: ? 1-2 cups of fresh fruits and vegetables each day. ? 3-4 servings of whole grains each day.  Avoiding foods and beverages that have added sugars.  Eating fish that contain healthy fats (omega-3 fatty acids), such as mackerel or salmon. If you need help putting together a healthy eating plan, try the DASH diet. This diet is high in fruits, vegetables, and whole grains. It is low in sodium, red meat, and added sugars. DASH stands for Dietary Approaches to Stop Hypertension. What lifestyle changes can be made?   Lose weight if you are overweight. Losing just 3?5% of your body weight can help prevent or control hypertension. ? For example, if your present  weight is 200 lb (91 kg), a loss of 3-5% of your weight means losing 6-10 lb (2.7-4.5 kg). ? Ask your health care provider to help you with a diet and exercise plan to safely lose weight.  Get enough exercise. Do at least 150 minutes of moderate-intensity exercise each week. ? You could do this in short exercise sessions several times a day, or you could do longer exercise sessions a few times a week. For example, you could take a brisk 10-minute walk or bike ride, 3 times a day, for 5 days a week.  Find ways to reduce stress, such as exercising, meditating, listening to music, or taking a yoga class. If you need help reducing stress, ask your health care provider.  Do not smoke. This includes e-cigarettes. Chemicals in tobacco and nicotine products raise your blood pressure each time you smoke. If you need help quitting, ask your health care provider.  Avoid alcohol. If you drink alcohol, limit alcohol intake to no more than 1 drink a day for nonpregnant women and 2 drinks a day for men. One drink equals 12 oz of beer, 5 oz of wine, or 1 oz of hard liquor. Why are these changes important? Diet and lifestyle changes can help you prevent hypertension, and they may make you feel better overall and improve your quality of life. If you have hypertension, making these changes will help you control it and help prevent major complications, such as:  Hardening and narrowing of arteries that supply blood to: ? Your heart. This can cause a heart   attack. ? Your brain. This can cause a stroke. ? Your kidneys. This can cause kidney failure.  Stress on your heart muscle, which can cause heart failure. What can I do to lower my risk?  Work with your health care provider to make a hypertension prevention plan that works for you. Follow your plan and keep all follow-up visits as told by your health care provider.  Learn how to check your blood pressure at home. Make sure that you know your personal target  blood pressure, as told by your health care provider. How is this treated? In addition to diet and lifestyle changes, your health care provider may recommend medicines to help lower your blood pressure. You may need to try a few different medicines to find what works best for you. You also may need to take more than one medicine. Take over-the-counter and prescription medicines only as told by your health care provider. Where to find support Your health care provider can help you prevent hypertension and help you keep your blood pressure at a healthy level. Your local hospital or your community may also provide support services and prevention programs. The American Heart Association offers an online support network at: http://supportnetwork.heart.org/high-blood-pressure Where to find more information Learn more about hypertension from:  National Heart, Lung, and Blood Institute: www.nhlbi.nih.gov/health/health-topics/topics/hbp  Centers for Disease Control and Prevention: www.cdc.gov/bloodpressure  American Academy of Family Physicians: http://familydoctor.org/familydoctor/en/diseases-conditions/high-blood-pressure.printerview.all.html Learn more about the DASH diet from:  National Heart, Lung, and Blood Institute: www.nhlbi.nih.gov/health/health-topics/topics/dash Contact a health care provider if:  You think you are having a reaction to medicines you have taken.  You have recurrent headaches or feel dizzy.  You have swelling in your ankles.  You have trouble with your vision. Summary  Hypertension often does not cause any symptoms until blood pressure is very high. It is important to get your blood pressure checked regularly.  Diet and lifestyle changes are the most important steps in preventing hypertension.  By keeping your blood pressure in a healthy range, you can prevent complications like heart attack, heart failure, stroke, and kidney failure.  Work with your health care  provider to make a hypertension prevention plan that works for you. This information is not intended to replace advice given to you by your health care provider. Make sure you discuss any questions you have with your health care provider. Document Revised: 10/11/2018 Document Reviewed: 02/28/2016 Elsevier Patient Education  2020 Elsevier Inc.  

## 2019-09-10 NOTE — Progress Notes (Signed)
History:  [name] is a 24 y.o. G1P0 who presents to clinic today for follow up from SAB.    The following portions of the patient's history were reviewed and updated as appropriate: allergies, current medications, family history, past medical history, social history, past surgical history and problem list.  Review of Systems:  Review of Systems  Constitutional: Negative.   Respiratory: Negative.   Cardiovascular: Negative.   Gastrointestinal: Negative.   Genitourinary: Negative.   Musculoskeletal: Negative.   Neurological: Negative.   Psychiatric/Behavioral: Negative.       Objective:  Physical Exam BP 121/78 (BP Location: Right Arm, Patient Position: Sitting, Cuff Size: Normal)   Pulse 67   Temp 98.5 F (36.9 C) (Oral)   Wt 155 lb 6.4 oz (70.5 kg)   LMP 07/11/2019   Breastfeeding Unknown   BMI 26.67 kg/m  Physical Exam HENT:     Head: Normocephalic.  Cardiovascular:     Rate and Rhythm: Normal rate and regular rhythm.  Pulmonary:     Effort: Pulmonary effort is normal. No respiratory distress.     Breath sounds: Normal breath sounds. No wheezing.  Abdominal:     General: There is no distension.     Palpations: Abdomen is soft.     Tenderness: There is no abdominal tenderness. There is no guarding.  Skin:    General: Skin is warm and dry.  Neurological:     Mental Status: She is alert and oriented to person, place, and time.    Assessment & Plan:  1. SAB (spontaneous abortion) - Patient denies vaginal bleeding, patient reports abdominal pain d/t currently ovulating  - Educated and discussed with patient to abstain from trying to conceive for 2-3 months to let body fully heal, patient verbalizes understanding  - Patient does not want to be on birth control d/t wanting to conceive this year  - Beta hCG quant (ref lab)  2. Transient hypertension - Elevated BP today in office, repeat 121/78  - elevated BP on previous visits in MAU  - Educated and  discussed with patient diet changes and exercise to manage hypertension prior to patient trying to conceive again.    Brandi [name], CNM 09/10/2019 8:24 PM

## 2019-09-11 LAB — BETA HCG QUANT (REF LAB): hCG Quant: <1 m[IU]/mL

## 2019-09-23 ENCOUNTER — Encounter: Payer: Medicaid Other | Admitting: Obstetrics & Gynecology

## 2020-02-13 ENCOUNTER — Ambulatory Visit (HOSPITAL_COMMUNITY)
Admission: EM | Admit: 2020-02-13 | Discharge: 2020-02-13 | Disposition: A | Discharge status: Home / Self Care | Payer: PRIVATE HEALTH INSURANCE | Attending: Family Medicine | Admitting: Family Medicine

## 2020-02-13 ENCOUNTER — Encounter (HOSPITAL_COMMUNITY): Payer: Self-pay

## 2020-02-13 ENCOUNTER — Other Ambulatory Visit: Payer: Self-pay

## 2020-02-13 DIAGNOSIS — M545 Low back pain, unspecified: Secondary | ICD-10-CM

## 2020-02-13 LAB — POCT URINALYSIS DIPSTICK, ED / UC
Bilirubin Urine: NEGATIVE
Glucose, UA: NEGATIVE mg/dL
Hgb urine dipstick: NEGATIVE
Ketones, ur: NEGATIVE mg/dL
Leukocytes,Ua: NEGATIVE
Nitrite: NEGATIVE
Protein, ur: NEGATIVE mg/dL
Specific Gravity, Urine: 1.02 (ref 1.005–1.030)
Urobilinogen, UA: 0.2 mg/dL (ref 0.0–1.0)
pH: 7.5 (ref 5.0–8.0)

## 2020-02-13 LAB — POC URINE PREG, ED: Preg Test, Ur: NEGATIVE

## 2020-02-13 MED ORDER — DICLOFENAC SODIUM 75 MG PO TBEC: 75.0000 mg | DELAYED_RELEASE_TABLET | Freq: Two times a day (BID) | ORAL | 0 refills | Status: DC

## 2020-02-13 NOTE — ED Triage Notes (Signed)
Pt presents with complaints of pain in her lower back that started last week. Reports she is concerned for a uti. Started last night the pain started getting worse.

## 2020-02-13 NOTE — Discharge Instructions (Addendum)

## 2020-02-14 NOTE — ED Provider Notes (Signed)
[facility]   [id number] 02/13/20 Arrival Time: 1541  ASSESSMENT & PLAN:  1. Acute right-sided low back pain without sciatica     No indication for back imaging. Suspect muscular in nature. U/A and UPT negative. Encouraged ROM as tolerated.  Begin: Meds ordered this encounter  Medications  . diclofenac (VOLTAREN) 75 MG EC tablet    Sig: Take 1 tablet (75 mg total) by mouth 2 (two) times daily.    Dispense:  14 tablet    Refill:  0    Recommend:  Follow-up Information    [facility].   Why: If worsening or failing to improve as anticipated. Contact information: [address] [place] [address] [phone]              Reviewed expectations re: course of current medical issues. Questions answered. Outlined signs and symptoms indicating need for more acute intervention. Patient verbalized understanding. After Visit Summary given.  SUBJECTIVE: History from: patient. Brandi [name] is a 24 y.o. female who reports lower right back pain. No injury/trauma. First noted over this past week. Would like to r/o UTI. No specific urinary symptoms or vaginal discharge. Pain worse last night, esp with certain movements. No abd pain. Afebrile. Normal PO intake without n/v. No OTC tx reported.  Past Surgical History:  Procedure Laterality Date  . HERNIA REPAIR        OBJECTIVE:  Vitals:   02/13/20 1658  BP: 129/90  Pulse: 74  Resp: 18  Temp: 98.3 F (36.8 C)  SpO2: 100%    General appearance: alert; no distress HEENT: [place]; AT Neck: supple with FROM Resp: unlabored respirations Back: TTP over lower R paraspinal musculature; FROM at hips Skin: warm and dry; no visible rashes Neurologic: gait normal Psychological: alert and cooperative; normal mood and affect   No Known Allergies  Past Medical History:  Diagnosis Date  . Medical history non-contributory    Social History   Socioeconomic  History  . Marital status: Married    Spouse name: Not on file  . Number of children: Not on file  . Years of education: Not on file  . Highest education level: Not on file  Occupational History  . Not on file  Tobacco Use  . Smoking status: Never Smoker  . Smokeless tobacco: Never Used  Substance and Sexual Activity  . Alcohol use: Never  . Drug use: Never  . Sexual activity: Yes    Birth control/protection: None  Other Topics Concern  . Not on file  Social History Narrative  . Not on file   Social Determinants of Health   Financial Resource Strain:   . Difficulty of Paying Living Expenses:   Food Insecurity:   . Worried About Programme researcher, broadcasting/film/video in the Last Year:   . Barista in the Last Year:   Transportation Needs:   . Freight forwarder (Medical):   Marland Kitchen Lack of Transportation (Non-Medical):   Physical Activity:   . Days of Exercise per Week:   . Minutes of Exercise per Session:   Stress:   . Feeling of Stress :   Social Connections:   . Frequency of Communication with Friends and Family:   . Frequency of Social Gatherings with Friends and Family:   . Attends Religious Services:   . Active Member of Clubs or Organizations:   . Attends Banker Meetings:   Marland Kitchen Marital Status:    Family  History  Problem Relation Age of Onset  . Hypertension Mother   . Healthy Father    Past Surgical History:  Procedure Laterality Date  . HERNIA REPAIR        Mardella Layman, MD 02/14/20 0930

## 2020-04-05 ENCOUNTER — Emergency Department (HOSPITAL_COMMUNITY)
Admission: EM | Admit: 2020-04-05 | Discharge: 2020-04-05 | Disposition: A | Discharge status: Home / Self Care | Payer: Self-pay | Attending: Emergency Medicine | Admitting: Emergency Medicine

## 2020-04-05 ENCOUNTER — Emergency Department (HOSPITAL_COMMUNITY): Payer: Self-pay

## 2020-04-05 ENCOUNTER — Other Ambulatory Visit: Payer: Self-pay

## 2020-04-05 ENCOUNTER — Encounter (HOSPITAL_COMMUNITY): Payer: Self-pay

## 2020-04-05 DIAGNOSIS — R1032 Left lower quadrant pain: Secondary | ICD-10-CM | POA: Insufficient documentation

## 2020-04-05 DIAGNOSIS — M545 Low back pain, unspecified: Secondary | ICD-10-CM | POA: Insufficient documentation

## 2020-04-05 DIAGNOSIS — R110 Nausea: Secondary | ICD-10-CM | POA: Insufficient documentation

## 2020-04-05 LAB — COMPREHENSIVE METABOLIC PANEL
ALT: 12 U/L (ref 0–44)
AST: 19 U/L (ref 15–41)
Albumin: 4 g/dL (ref 3.5–5.0)
Alkaline Phosphatase: 24 U/L — ABNORMAL LOW (ref 38–126)
Anion gap: 12 (ref 5–15)
BUN: 8 mg/dL (ref 6–20)
CO2: 20 mmol/L — ABNORMAL LOW (ref 22–32)
Calcium: 9.2 mg/dL (ref 8.9–10.3)
Chloride: 105 mmol/L (ref 98–111)
Creatinine, Ser: 0.95 mg/dL (ref 0.44–1.00)
GFR calc Af Amer: >60 mL/min (ref >60)
GFR calc non Af Amer: >60 mL/min (ref >60)
Glucose, Bld: 117 mg/dL — ABNORMAL HIGH (ref 70–99)
Potassium: 3.8 mmol/L (ref 3.5–5.1)
Sodium: 137 mmol/L (ref 135–145)
Total Bilirubin: 1.1 mg/dL (ref 0.3–1.2)
Total Protein: 7.1 g/dL (ref 6.5–8.1)

## 2020-04-05 LAB — URINALYSIS, ROUTINE W REFLEX MICROSCOPIC
RBC / HPF: >50 RBC/hpf — ABNORMAL HIGH (ref 0–5)
Specific Gravity, Urine: 1.014 (ref 1.005–1.030)
pH: 5 (ref 5.0–8.0)

## 2020-04-05 LAB — LIPASE, BLOOD: Lipase: 23 U/L (ref 11–51)

## 2020-04-05 LAB — CBC
HCT: 41.1 % (ref 36.0–46.0)
Hemoglobin: 14 g/dL (ref 12.0–15.0)
MCH: 29.7 pg (ref 26.0–34.0)
MCHC: 34.1 g/dL (ref 30.0–36.0)
MCV: 87.3 fL (ref 80.0–100.0)
Platelets: 296 10*3/uL (ref 150–400)
RBC: 4.71 MIL/uL (ref 3.87–5.11)
RDW: 12.2 % (ref 11.5–15.5)
WBC: 5.8 10*3/uL (ref 4.0–10.5)
nRBC: 0 % (ref 0.0–0.2)

## 2020-04-05 LAB — I-STAT BETA HCG BLOOD, ED (MC, WL, AP ONLY): I-stat hCG, quantitative: <5 m[IU]/mL (ref <5)

## 2020-04-05 MED ORDER — HYDROCODONE-ACETAMINOPHEN 5-325 MG PO TABS
1.0000 | ORAL_TABLET | Freq: Once | ORAL | Status: AC
  Administered 2020-04-05: 1 via ORAL
  Filled 2020-04-05: qty 1

## 2020-04-05 MED ORDER — ONDANSETRON 4 MG PO TBDP: 4.0000 mg | ORAL_TABLET | Freq: Three times a day (TID) | ORAL | 0 refills | Status: DC | PRN

## 2020-04-05 MED ORDER — ONDANSETRON 4 MG PO TBDP
4.0000 mg | ORAL_TABLET | Freq: Once | ORAL | Status: AC
  Administered 2020-04-05: 4 mg via ORAL
  Filled 2020-04-05: qty 1

## 2020-04-05 NOTE — ED Provider Notes (Signed)
I saw and evaluated the patient, reviewed the resident's note and I agree with the findings and plan.  Pertinent History: 2 days of l sided lumbar p[ain - radiates anteriorly - had norco in lobby - 5/10 pain at this time - no hx of KS, had nausea but no vomiting  Pertinent Exam findings: not reproducible pain - mild LLQ pai - soft, looks well otherwise.  Neg CT and labs, UA pending.  I was personally present and directly supervised the following procedures:  Home with nsaid / zofran  I personally interpreted the EKG as well as the resident and agree with the interpretation on the resident's chart.  Final diagnoses:  Acute left-sided low back pain without sciatica  Left lower quadrant abdominal pain      Eber Hong, MD 04/06/20 1702

## 2020-04-05 NOTE — ED Provider Notes (Signed)
[facility] EMERGENCY DEPARTMENT Provider Note   CSN: [id number] Arrival date & time: 04/05/20  0900     History Chief Complaint  Patient presents with  . Back Pain  . Abdominal Pain    Brandi [name] is a 24 y.o. female with history of painful menstrual cycles who presents to the emergency department with left lower back pain for the last 2 days.  This was initially intermittent, but last night became constant.  It does radiate into her left flank and lower left abdomen.  She is nauseous, but not vomiting.  Started her period 3 days ago, so she is unsure if she has seen blood in her urine.  In triage, she received norco and Zofran, which resolved her nausea and took her pain down from a 10 out of 10 to a 5 out of 10.  Patient states she was having some cloudy discharge, but saw her OB/GYN the other day and had negative testing at that time. Denies vaginal or pelvic pain.  No history of trauma to the area.  She is here with her husband.    Back Pain Location:  Lumbar spine Quality:  Aching Pain severity:  Severe Pain is:  Same all the time Duration:  2 days Timing:  Constant Progression:  Improving Chronicity:  New Relieved by:  Narcotics Worsened by:  Nothing Associated symptoms: abdominal pain   Associated symptoms: no bladder incontinence, no bowel incontinence, no chest pain, no dysuria, no fever, no headaches and no pelvic pain        Past Medical History:  Diagnosis Date  . Medical history non-contributory     There are no problems to display for this patient.   Past Surgical History:  Procedure Laterality Date  . HERNIA REPAIR       OB History    Gravida  1   Para      Term      Preterm      AB      Living        SAB      TAB      Ectopic      Multiple      Live Births              Family History  Problem Relation Age of Onset  . Hypertension Mother   . Healthy Father     Social History   Tobacco Use   . Smoking status: Never Smoker  . Smokeless tobacco: Never Used  Substance Use Topics  . Alcohol use: Never  . Drug use: Never    Home Medications Prior to Admission medications   Medication Sig Start Date End Date Taking? Authorizing Provider  diclofenac (VOLTAREN) 75 MG EC tablet Take 1 tablet (75 mg total) by mouth 2 (two) times daily. 02/13/20   Mardella Layman, MD  ondansetron (ZOFRAN ODT) 4 MG disintegrating tablet Take 1 tablet (4 mg total) by mouth every 8 (eight) hours as needed for up to 4 doses for nausea or vomiting. 04/05/20   Allayne Butcher, MD  Prenatal Vit-Fe Fumarate-FA (PRENATAL MULTIVITAMIN) TABS tablet Take 1 tablet by mouth daily at 12 noon.    [provider]    Allergies    Patient has no known allergies.  Review of Systems   Review of Systems  Constitutional: Negative for chills and fever.  HENT: Negative for ear pain and sore throat.   Eyes: Negative for pain and visual disturbance.  Respiratory: Negative  for cough and shortness of breath.   Cardiovascular: Negative for chest pain and palpitations.  Gastrointestinal: Positive for abdominal pain and nausea. Negative for bowel incontinence and vomiting.  Genitourinary: Negative for bladder incontinence, dysuria, hematuria and pelvic pain.  Musculoskeletal: Positive for back pain. Negative for arthralgias.  Skin: Negative for color change and rash.  Neurological: Negative for seizures, syncope and headaches.  All other systems reviewed and are negative.   Physical Exam Updated Vital Signs BP 128/85 (BP Location: Left Arm)   Pulse (!) 56   Temp 98.4 F (36.9 C) (Oral)   Resp 15   Ht 5\' 3"  (1.6 m)   Wt 70.8 kg   LMP 07/09/2019 (Exact Date)   SpO2 100%   BMI 27.63 kg/m   Physical Exam Vitals and nursing note reviewed.  Constitutional:      General: She is not in acute distress.    Appearance: She is well-developed.  HENT:     Head: Normocephalic and atraumatic.  Eyes:      Conjunctiva/sclera: Conjunctivae normal.  Cardiovascular:     Rate and Rhythm: Normal rate and regular rhythm.     Heart sounds: No murmur heard.   Pulmonary:     Effort: Pulmonary effort is normal. No respiratory distress.     Breath sounds: Normal breath sounds.  Abdominal:     Palpations: Abdomen is soft.     Tenderness: There is abdominal tenderness in the left lower quadrant. There is no right CVA tenderness, left CVA tenderness, guarding or rebound.  Musculoskeletal:     Cervical back: Neck supple.     Comments: Tenderness to the left lumbar back, not worse with palpation.  No overlying skin rash.  Skin:    General: Skin is warm and dry.  Neurological:     General: No focal deficit present.     Mental Status: She is alert and oriented to person, place, and time.     ED Results / Procedures / Treatments   Labs (all labs ordered are listed, but only abnormal results are displayed) Labs Reviewed  COMPREHENSIVE METABOLIC PANEL - Abnormal; Notable for the following components:      Result Value   CO2 20 (*)    Glucose, Bld 117 (*)    Alkaline Phosphatase 24 (*)    All other components within normal limits  URINALYSIS, ROUTINE W REFLEX MICROSCOPIC - Abnormal; Notable for the following components:   Color, Urine RED (*)    APPearance HAZY (*)    Glucose, UA   (*)    Value: TEST NOT REPORTED DUE TO COLOR INTERFERENCE OF URINE PIGMENT   Hgb urine dipstick   (*)    Value: TEST NOT REPORTED DUE TO COLOR INTERFERENCE OF URINE PIGMENT   Bilirubin Urine   (*)    Value: TEST NOT REPORTED DUE TO COLOR INTERFERENCE OF URINE PIGMENT   Ketones, ur   (*)    Value: TEST NOT REPORTED DUE TO COLOR INTERFERENCE OF URINE PIGMENT   Protein, ur   (*)    Value: TEST NOT REPORTED DUE TO COLOR INTERFERENCE OF URINE PIGMENT   Nitrite   (*)    Value: TEST NOT REPORTED DUE TO COLOR INTERFERENCE OF URINE PIGMENT   Leukocytes,Ua   (*)    Value: TEST NOT REPORTED DUE TO COLOR INTERFERENCE OF URINE  PIGMENT   RBC / HPF >50 (*)    Bacteria, UA RARE (*)    All other components within normal limits  URINE CULTURE  LIPASE, BLOOD  CBC  I-STAT BETA HCG BLOOD, ED ([facility], [facility], [facility] ONLY)    EKG None  Radiology CT Renal Stone Study  Result Date: 04/05/2020 CLINICAL DATA:  Left flank/low back pain radiating to the left lower quadrant for 1 day. EXAM: CT ABDOMEN AND PELVIS WITHOUT CONTRAST TECHNIQUE: Multidetector CT imaging of the abdomen and pelvis was performed following the standard protocol without IV contrast. COMPARISON:  None. FINDINGS: Lower chest: No significant pulmonary nodules or acute consolidative airspace disease. Hepatobiliary: Normal liver size. No liver mass. Normal gallbladder with no radiopaque cholelithiasis. No biliary ductal dilatation. Pancreas: Normal, with no mass or duct dilation. Spleen: Normal size. No mass. Adrenals/Urinary Tract: Normal adrenals. No renal stones. No hydronephrosis. No contour deforming renal lesions. Normal caliber ureters. No ureteral stones. Normal nondistended bladder. Stomach/Bowel: Normal non-distended stomach. Normal caliber small bowel with no small bowel wall thickening. Normal appendix. Normal large bowel with no diverticulosis, large bowel wall thickening or pericolonic fat stranding. Vascular/Lymphatic: Normal caliber abdominal aorta. No pathologically enlarged lymph nodes in the abdomen or pelvis. Reproductive: Grossly normal uterus.  No adnexal mass. Other: No pneumoperitoneum, ascites or focal fluid collection. Musculoskeletal: No aggressive appearing focal osseous lesions. Bilateral L5 pars defects. IMPRESSION: 1. No acute abnormality. No urolithiasis. No hydronephrosis. 2. Bilateral L5 pars defects. Electronically Signed   By: Delbert Phenix M.D.   On: 04/05/2020 13:02    Procedures Procedures (including critical care time)  Medications Ordered in ED Medications  HYDROcodone-acetaminophen (NORCO/VICODIN) 5-325 MG per tablet 1 tablet (1 tablet  Oral Given 04/05/20 1045)  ondansetron (ZOFRAN-ODT) disintegrating tablet 4 mg (4 mg Oral Given 04/05/20 1037)    ED Course  I have reviewed the triage vital signs and the nursing notes.  Pertinent labs & imaging results that were available during my care of the patient were reviewed by me and considered in my medical decision making (see chart for details).    MDM Rules/Calculators/A&P                         CBC, CMP, lipase unremarkable.  Negative pregnancy test.  No nephrolithiasis or urolithiasis on CT scan.  This is a noncontrasted study, but the appendix appeared normal.  UA with marked blood, presumably from her menstrual cycle, which is interfering with interpretation, but only rare bacteria seen and patient is not having any urinary symptoms.  Will send for culture but not treat at this time.  Pain could be from a passed kidney stone, musculoskeletal pain, or her menstrual cycle.  Regardless, no emergent cause found.  Patient encouraged to take Tylenol and ibuprofen for pain at home and was given several doses of Zofran if nausea were to continue.  Return precautions given.  Patient verbalized understanding and agreement and is appropriate for discharge at this time.  This patient was seen with Dr. Hyacinth Meeker. Final Clinical Impression(s) / ED Diagnoses Final diagnoses:  Acute left-sided low back pain without sciatica  Left lower quadrant abdominal pain    Rx / DC Orders ED Discharge Orders         Ordered    ondansetron (ZOFRAN ODT) 4 MG disintegrating tablet  Every 8 hours PRN        04/05/20 1748           Allayne Butcher, MD 04/05/20 2110    Eber Hong, MD 04/06/20 1702

## 2020-04-05 NOTE — ED Triage Notes (Signed)
Pt presents with Left lower back pain radiating to her LLQ region x1 days. symptoms started day 2 of her menstrual cycle. Pt using essential oils, heating pad, Pamprin and self massaging with no relief. Pt denies abnormal vaginal bleeding. This is day 3 of her menstrual cycle

## 2020-04-05 NOTE — Discharge Instructions (Signed)
We did not find any emergency cause for your abdominal pain today.  It could be be from a passing kidney stone or muscular pain. Please take two extra strength Tylenol and ibuprofen every 6-8 hours for pain at home.  You may use Zofran up to every 8 hours for nausea.  Please come back to the emergency department if you have worsening pain, fever, vomiting, or other concerning symptoms.

## 2020-04-05 NOTE — ED Triage Notes (Signed)
Emergency Medicine Provider Triage Evaluation Note  Brandi [name] , a 24 y.o. female  was evaluated in triage.  Pt complains of left lower back pain intermittent for last 3 days, radiating to the left flank. Last night became constant, but waxing and waning.   Nausea, but no vomiting. Menstrual cycle started 3 days ago, on time.   Review of Systems  Positive: Left lower back pain, left flank pain, nausea Negative: Vomiting, diarrhea, abnormal discharge, urinary sx  Physical Exam  BP 129/86 (BP Location: Left Arm)   Pulse (!) 56   Temp 98.2 F (36.8 C) (Oral)   Resp 14   Ht 5\' 3"  (1.6 m)   Wt 70.8 kg   LMP 07/09/2019 (Exact Date)   SpO2 100%   BMI 27.63 kg/m  Gen:   Awake, no distress   HEENT:  Atraumatic  Resp:  Normal effort  Cardiac:  Normal rate  Abd:   Nondistended, seemingly mild left flank and left lower quadrant tenderness, No CVA tenderness. MSK:   Moves extremities without difficulty  Neuro:  Speech clear   Medical Decision Making  Medically screening exam initiated at 9:35 AM.  Appropriate orders placed.  Brandi [name] was informed that the remainder of the evaluation will be completed by another provider, this initial triage assessment does not replace that evaluation, and the importance of remaining in the ED until their evaluation is complete.  Clinical Impression   Pending pregnancy test to decide on MAU vs stay here in our ED.    Pregnancy test negative. Imaging order placed.   Celine Mans, PA-C 04/05/20 [phone]

## 2020-04-08 LAB — URINE CULTURE: Culture: <10000 — AB

## 2020-04-10 LAB — HEMOGLOBIN A1C: Hemoglobin A1C: 5.4

## 2020-04-10 LAB — TSH RFX ON ABNORMAL TO FREE T4: TSH W/REFLEX TO FT4: 2.16

## 2020-04-10 LAB — FSH/LH
Estradiol: 44.7
FSH: 6.3
LH: 6.5

## 2020-04-10 LAB — INSULIN, RANDOM: INSULIN: 14.4

## 2020-04-10 LAB — ANTI-MULLERIAN HORMONE (AMH), FEMALE: ANTI-MULLERIAN HORMONE (AMH): 15.9

## 2020-04-10 LAB — TESTOSTERONE: Testosterone: 39

## 2020-12-16 DIAGNOSIS — B351 Tinea unguium: Secondary | ICD-10-CM

## 2020-12-16 HISTORY — DX: Tinea unguium: B35.1

## 2020-12-16 LAB — OB RESULTS CONSOLE GC/CHLAMYDIA
Chlamydia: NEGATIVE
Gonorrhea: NEGATIVE

## 2020-12-17 LAB — HM PAP SMEAR: HM Pap smear: NORMAL

## 2020-12-23 LAB — OB RESULTS CONSOLE RPR: RPR: NONREACTIVE

## 2020-12-23 LAB — OB RESULTS CONSOLE HGB/HCT, BLOOD
HCT: 42 — AB (ref 29–41)
Hemoglobin: 14.2

## 2020-12-23 LAB — HEPATITIS C ANTIBODY: HCV Ab: NEGATIVE

## 2020-12-23 LAB — OB RESULTS CONSOLE ANTIBODY SCREEN: Antibody Screen: NEGATIVE

## 2020-12-23 LAB — OB RESULTS CONSOLE VARICELLA ZOSTER ANTIBODY, IGG: Varicella: IMMUNE

## 2020-12-23 LAB — OB RESULTS CONSOLE HEPATITIS B SURFACE ANTIGEN: Hepatitis B Surface Ag: NEGATIVE

## 2020-12-23 LAB — OB RESULTS CONSOLE PLATELET COUNT: Platelets: 319

## 2020-12-23 LAB — OB RESULTS CONSOLE RUBELLA ANTIBODY, IGM: Rubella: IMMUNE

## 2020-12-23 LAB — OB RESULTS CONSOLE ABO/RH: RH Type: POSITIVE

## 2020-12-23 LAB — SICKLE CELL SCREEN: Sickle Cell Screen: NEGATIVE

## 2020-12-23 LAB — OB RESULTS CONSOLE HIV ANTIBODY (ROUTINE TESTING): HIV: NONREACTIVE

## 2020-12-25 LAB — CYSTIC FIBROSIS DIAGNOSTIC STUDY: Interpretation-CFDNA:: NEGATIVE

## 2021-01-10 ENCOUNTER — Inpatient Hospital Stay (HOSPITAL_COMMUNITY)
Admission: AD | Admit: 2021-01-10 | Discharge: 2021-01-11 | Disposition: A | Discharge status: Home / Self Care | Payer: Medicaid Other | Attending: Obstetrics and Gynecology | Admitting: Obstetrics and Gynecology

## 2021-01-10 ENCOUNTER — Encounter (HOSPITAL_COMMUNITY): Payer: Self-pay | Admitting: *Deleted

## 2021-01-10 DIAGNOSIS — R519 Headache, unspecified: Secondary | ICD-10-CM | POA: Insufficient documentation

## 2021-01-10 DIAGNOSIS — Z3A13 13 weeks gestation of pregnancy: Secondary | ICD-10-CM | POA: Insufficient documentation

## 2021-01-10 DIAGNOSIS — R1030 Lower abdominal pain, unspecified: Secondary | ICD-10-CM | POA: Insufficient documentation

## 2021-01-10 DIAGNOSIS — O219 Vomiting of pregnancy, unspecified: Secondary | ICD-10-CM | POA: Diagnosis not present

## 2021-01-10 DIAGNOSIS — O26891 Other specified pregnancy related conditions, first trimester: Secondary | ICD-10-CM | POA: Insufficient documentation

## 2021-01-10 DIAGNOSIS — O210 Mild hyperemesis gravidarum: Secondary | ICD-10-CM

## 2021-01-10 LAB — URINALYSIS, ROUTINE W REFLEX MICROSCOPIC
Bilirubin Urine: NEGATIVE
Glucose, UA: NEGATIVE mg/dL
Hgb urine dipstick: NEGATIVE
Ketones, ur: NEGATIVE mg/dL
Leukocytes,Ua: NEGATIVE
Nitrite: NEGATIVE
Protein, ur: NEGATIVE mg/dL
Specific Gravity, Urine: 1.026 (ref 1.005–1.030)
pH: 5 (ref 5.0–8.0)

## 2021-01-10 LAB — WET PREP, GENITAL
Clue Cells Wet Prep HPF POC: NONE SEEN
Sperm: NONE SEEN
Trich, Wet Prep: NONE SEEN
Yeast Wet Prep HPF POC: NONE SEEN

## 2021-01-10 MED ORDER — PROMETHAZINE HCL 25 MG PO TABS
12.5000 mg | ORAL_TABLET | Freq: Once | ORAL | Status: AC
  Administered 2021-01-10: 12.5 mg via ORAL
  Filled 2021-01-10: qty 1

## 2021-01-10 NOTE — MAU Provider Note (Addendum)
Chief Complaint:  Nausea and Abdominal Pain   Event Date/Time   First Provider Initiated Contact with Patient 01/10/21 2207     HPI: Brandi [name] is a 25 y.o. G2P0010 at [redacted]w[redacted]d who presents to maternity admissions reporting nausea, vomiting, headache, and lower abdominal cramping. Patient reports that she has had ongoing nausea today. She reports that she vomited 2x tonight after eating a plum and a dill pickle. She reports she usually only vomits about 1x/week, but was concerned that she vomited more tonight. She is not on any nausea medications. She also reports having a headache all day but has not taken anything for pain. Has had some lower abdominal cramping that is not painful, more "annoying" along with a "jelly-like" discharge. She denies itching,odor, vaginal bleeding, urinary s/s, fever, or chills. No constipation or diarrhea.   Pregnancy Course:   Past Medical History:  Diagnosis Date   Medical history non-contributory    OB History  Gravida Para Term Preterm AB Living  2       1    SAB IAB Ectopic Multiple Live Births  1            # Outcome Date GA Lbr Len/2nd Weight Sex Delivery Anes PTL Lv  2 Current           1 SAB            Past Surgical History:  Procedure Laterality Date   HERNIA REPAIR     Family History  Problem Relation Age of Onset   Hypertension Mother    Healthy Father    Social History   Tobacco Use   Smoking status: Never   Smokeless tobacco: Never  Substance Use Topics   Alcohol use: Never   Drug use: Never   No Known Allergies No medications prior to admission.    I have reviewed patient's Past Medical Hx, Surgical Hx, Family Hx, Social Hx, medications and allergies.   ROS:  Review of Systems  Constitutional: Negative.   Respiratory: Negative.    Cardiovascular: Negative.   Gastrointestinal:  Positive for abdominal pain, nausea and vomiting. Negative for constipation and diarrhea.  Genitourinary:  Positive for vaginal  discharge. Negative for dysuria, frequency and vaginal bleeding.       Clear, jelly-like  Musculoskeletal: Negative.   Neurological:  Positive for headaches.  Psychiatric/Behavioral: Negative.     Physical Exam  Patient Vitals for the past 24 hrs:  BP Temp Temp src Pulse Resp SpO2 Height Weight  01/10/21 2201 124/75 98.4 F (36.9 C) Oral 69 18 99 % -- --  01/10/21 2129 119/76 98.2 F (36.8 C) Oral 75 20 -- 5\' 3"  (1.6 m) 72.4 kg   Constitutional: well-developed, well-nourished female in no acute distress.  Cardiovascular: normal rate Respiratory: normal effort GI: abd soft, non-tender, no guarding MS: extremities nontender, no edema, normal ROM Neurologic: alert and oriented x 4.  GU: neg CVAT. Pelvic: deferred, blind swabs obtained Dilation: Closed Effacement (%): Thick Exam by:: Brandi [name]  FHT: 160 bpm via doppler   Labs: Results for orders placed or performed during the hospital encounter of 01/10/21 (from the past 24 hour(s))  Urinalysis, Routine w reflex microscopic Urine, Clean Catch     Status: Abnormal   Collection Time: 01/10/21  9:45 PM  Result Value Ref Range   Color, Urine YELLOW YELLOW   APPearance HAZY (A) CLEAR   Specific Gravity, Urine 1.026 1.005 - 1.030   pH 5.0 5.0 - 8.0   Glucose,  UA NEGATIVE NEGATIVE mg/dL   Hgb urine dipstick NEGATIVE NEGATIVE   Bilirubin Urine NEGATIVE NEGATIVE   Ketones, ur NEGATIVE NEGATIVE mg/dL   Protein, ur NEGATIVE NEGATIVE mg/dL   Nitrite NEGATIVE NEGATIVE   Leukocytes,Ua NEGATIVE NEGATIVE  Wet prep, genital     Status: Abnormal   Collection Time: 01/10/21 10:32 PM  Result Value Ref Range   Yeast Wet Prep HPF POC NONE SEEN NONE SEEN   Trich, Wet Prep NONE SEEN NONE SEEN   Clue Cells Wet Prep HPF POC NONE SEEN NONE SEEN   WBC, Wet Prep HPF POC MANY (A) NONE SEEN   Sperm NONE SEEN     Imaging:  No results found.  MAU Course: Orders Placed This Encounter  Procedures   Wet prep, genital   Urinalysis, Routine w  reflex microscopic Urine, Clean Catch   Discharge patient Discharge disposition: 01-Home or Self Care; Discharge patient date: 01/11/2021   Meds ordered this encounter  Medications   promethazine (PHENERGAN) tablet 12.5 mg   ondansetron (ZOFRAN-ODT) disintegrating tablet 4 mg   ondansetron (ZOFRAN ODT) 4 MG disintegrating tablet    Sig: Take 1 tablet (4 mg total) by mouth every 8 (eight) hours as needed for up to 4 doses for nausea or vomiting.    Dispense:  20 tablet    Refill:  0    MDM: UA unremarkable, specific gravity wnl, negative ketones Wet prep, GC/CT collected. Unremarkable wet prep and UA. Phenergan PO ordered FHTs 160bpm  Care handed over to Brandi [name] at 2235.  Brandi Eng, MSN, [name] 01/10/21 2235  Pt reports complete resolution of symptoms s/p administration of po phenergan, ODT zofran.   Assessment/Plan: Brandi [name] is a 25 y.o. G2P0010 at [redacted]w[redacted]d who presents to maternity admissions reporting nausea, vomiting, headache, and lower abdominal cramping. Reassuringly, improvement in symptoms s/p phenergan and zofran with successful po challenge. Headache resolved with po intake. +FHTs on Doppler. - discharged home with script for zofran 4mg  every 8 hours prn - instructed pt to call clinic to schedule first prenatal visit - strict return precautions to return if concern of inability to tolerate po intake, severe abdominal pain, vaginal bleeding or other concerns  Brandi [name], , MD OB Fellow, [facility] 01/11/2021 5:40 AM

## 2021-01-10 NOTE — MAU Note (Signed)
HAS VOMITED AT  7 P AND  9PM- ATE LAST AT 2 PM.  HAS NOT TOLD OFFICE OF VOMITING

## 2021-01-10 NOTE — Discharge Instructions (Signed)
  Offices with midwives:  [facility]             [address] [place], [place] [address] [phone]  [facility] for [employer] at [facility]                                                             [address], [address], [place], [place], [address] [phone]  Center for [facility] [address], [place], [place], [address] [phone]  Center for [facility] [address], [place], [place], [address] [phone]  Center for [facility]                                 [address] [place], [place], [place], [address] [phone]  Center for [facility] at [facility]                                    [address] [place], [place], [address] [phone]

## 2021-01-10 NOTE — MAU Note (Signed)
PT SAYS SHE HAS LOWER ABD CRAMPS - STARTED TODAY - AT 4 PM. HAS HAD H/A ALL DAY -  TOOK NO MEDS  PNC WITH - DR Mindi Slicker

## 2021-01-11 DIAGNOSIS — B354 Tinea corporis: Secondary | ICD-10-CM

## 2021-01-11 HISTORY — DX: Tinea corporis: B35.4

## 2021-01-11 LAB — GC/CHLAMYDIA PROBE AMP (~~LOC~~) NOT AT ARMC
Chlamydia: NEGATIVE
Comment: NEGATIVE
Comment: NORMAL
Neisseria Gonorrhea: NEGATIVE

## 2021-01-11 MED ORDER — ONDANSETRON 4 MG PO TBDP
4.0000 mg | ORAL_TABLET | Freq: Once | ORAL | Status: AC
  Administered 2021-01-11: 4 mg via ORAL
  Filled 2021-01-11: qty 1

## 2021-01-11 MED ORDER — ONDANSETRON 4 MG PO TBDP: 4.0000 mg | ORAL_TABLET | Freq: Three times a day (TID) | ORAL | 0 refills | Status: DC | PRN

## 2021-02-07 ENCOUNTER — Other Ambulatory Visit: Payer: Self-pay

## 2021-02-07 ENCOUNTER — Inpatient Hospital Stay (HOSPITAL_COMMUNITY)
Admission: AD | Admit: 2021-02-07 | Discharge: 2021-02-07 | Disposition: A | Discharge status: Home / Self Care | Payer: Medicaid Other | Attending: Obstetrics and Gynecology | Admitting: Obstetrics and Gynecology

## 2021-02-07 ENCOUNTER — Encounter (HOSPITAL_COMMUNITY): Payer: Self-pay | Admitting: Obstetrics and Gynecology

## 2021-02-07 DIAGNOSIS — O219 Vomiting of pregnancy, unspecified: Secondary | ICD-10-CM | POA: Diagnosis not present

## 2021-02-07 DIAGNOSIS — R519 Headache, unspecified: Secondary | ICD-10-CM

## 2021-02-07 DIAGNOSIS — O26892 Other specified pregnancy related conditions, second trimester: Secondary | ICD-10-CM | POA: Diagnosis present

## 2021-02-07 DIAGNOSIS — Z3A17 17 weeks gestation of pregnancy: Secondary | ICD-10-CM | POA: Insufficient documentation

## 2021-02-07 LAB — URINALYSIS, ROUTINE W REFLEX MICROSCOPIC
Bilirubin Urine: NEGATIVE
Glucose, UA: NEGATIVE mg/dL
Hgb urine dipstick: NEGATIVE
Ketones, ur: NEGATIVE mg/dL
Leukocytes,Ua: NEGATIVE
Nitrite: NEGATIVE
Protein, ur: NEGATIVE mg/dL
Specific Gravity, Urine: 1.01 (ref 1.005–1.030)
pH: 6 (ref 5.0–8.0)

## 2021-02-07 MED ORDER — ONDANSETRON 4 MG PO TBDP
8.0000 mg | ORAL_TABLET | Freq: Once | ORAL | Status: AC
  Administered 2021-02-07: 8 mg via ORAL
  Filled 2021-02-07: qty 2

## 2021-02-07 MED ORDER — ACETAMINOPHEN 500 MG PO TABS
1000.0000 mg | ORAL_TABLET | Freq: Once | ORAL | Status: AC
  Administered 2021-02-07: 1000 mg via ORAL
  Filled 2021-02-07: qty 2

## 2021-02-07 NOTE — Discharge Instructions (Signed)
Safe Medications in Pregnancy - PLEASE take any of these medications for symptom treatment  Acne: Benzoyl Peroxide Salicylic Acid  Backache/Headache: Tylenol: 2 regular strength every 4 hours OR              2 Extra strength every 6 hours  Colds/Coughs/Allergies: Benadryl (alcohol free) 25 mg every 6 hours as needed Breath right strips Claritin Cepacol throat lozenges Chloraseptic throat spray Cold-Eeze- up to three times per day Cough drops, alcohol free Flonase (by prescription only) Guaifenesin Mucinex Robitussin DM (plain only, alcohol free) Saline nasal spray/drops Sudafed (pseudoephedrine) & Actifed ** use only after [redacted] weeks gestation and if you do not have high blood pressure Tylenol Vicks Vaporub Zinc lozenges Zyrtec   Constipation: Colace Ducolax suppositories Fleet enema Glycerin suppositories Metamucil Milk of magnesia Miralax Senokot Smooth move tea  Diarrhea: Kaopectate Imodium A-D  *NO pepto Bismol  Hemorrhoids: Anusol Anusol HC Preparation H Tucks  Indigestion: Tums Maalox Mylanta Zantac  Pepcid  Insomnia: Benadryl (alcohol free) 25mg  every 6 hours as needed Tylenol PM Unisom, no Gelcaps  Leg Cramps: Tums MagGel  Nausea/Vomiting:  Bonine Dramamine Emetrol Ginger extract Sea bands Meclizine  Nausea medication to take during pregnancy:  Unisom (doxylamine succinate 25 mg tablets) Take one tablet daily at bedtime. If symptoms are not adequately controlled, the dose can be increased to a maximum recommended dose of two tablets daily (1/2 tablet in the morning, 1/2 tablet mid-afternoon and one at bedtime). Vitamin B6 100mg  tablets. Take one tablet twice a day (up to 200 mg per day).  Skin Rashes: Aveeno products Benadryl cream or 25mg  every 6 hours as needed Calamine Lotion 1% cortisone cream  Yeast infection: Gyne-lotrimin 7 Monistat 7   **If taking multiple medications, please check labels to avoid duplicating the  same active ingredients **take medication as directed on the label ** Do not exceed 4000 mg of tylenol in 24 hours **Do not take medications that contain aspirin or ibuprofen

## 2021-02-07 NOTE — MAU Note (Signed)
PT SAYS H/A - STARTED ON Thursday- TOOK REG TYLENOL 2 TABS - ON YESTERDAY. NONE TODAY -  LAST H/A WAS LAST MTH . PNC WITH DR Mindi Slicker -TOLD YO TAKE TYLENOL. VOMITED AT 730PM- , LAST TIME VOMITED WAS 7-27. HAS MEDS FOR VOMITING  DID NOT TAKE ANY MEDS - DOES NOT LIKE TO TAKE ANY MEDS.

## 2021-02-07 NOTE — MAU Provider Note (Signed)
History     CSN: [id number]  Arrival date and time: 02/07/21 1925   Event Date/Time   First Provider Initiated Contact with Patient 02/07/21 2120      Chief Complaint  Patient presents with   Headache   [name] is a 25 y.o. year old G71P0010 female at [redacted]w[redacted]d weeks gestation who presents to MAU reporting she has had a H/A since 02/03/21. She took Tylenol 650 mg yesterday (02/06/21), but none today. She last had a H/A last month. She also reports that she vomited once at 1930 this evening. She states the last time she vomited was 01/26/21. She has meds for vomiting, but did not take any. She "just came right over." She reports she doesn't like taking medications. She receives [employer] with Dr. Mindi Slicker at [facility]; next appt is 02/25/21.   OB History     Gravida  2   Para      Term      Preterm      AB  1   Living         SAB  1   IAB      Ectopic      Multiple      Live Births              Past Medical History:  Diagnosis Date   Medical history non-contributory     Past Surgical History:  Procedure Laterality Date   HERNIA REPAIR      Family History  Problem Relation Age of Onset   Hypertension Mother    Healthy Father     Social History   Tobacco Use   Smoking status: Never   Smokeless tobacco: Never  Substance Use Topics   Alcohol use: Never   Drug use: Never    Allergies: No Known Allergies  Medications Prior to Admission  Medication Sig Dispense Refill Last Dose   ondansetron (ZOFRAN ODT) 4 MG disintegrating tablet Take 1 tablet (4 mg total) by mouth every 8 (eight) hours as needed for up to 4 doses for nausea or vomiting. 20 tablet 0    Prenatal Vit-Fe Fumarate-FA (PRENATAL MULTIVITAMIN) TABS tablet Take 1 tablet by mouth daily at 12 noon.       Review of Systems  Constitutional: Negative.   HENT: Negative.    Eyes: Negative.  Negative for visual disturbance.  Respiratory: Negative.    Cardiovascular: Negative.    Gastrointestinal:  Positive for nausea and vomiting (x 1 @ 1930).  Endocrine: Negative.   Genitourinary: Negative.   Musculoskeletal: Negative.   Skin: Negative.   Allergic/Immunologic: Negative.   Neurological:  Positive for headaches (since 02/03/21).  Hematological: Negative.   Psychiatric/Behavioral: Negative.    Physical Exam   Blood pressure 119/72, pulse 78, temperature 97.9 F (36.6 C), temperature source Oral, resp. rate 18, height 5\' 3"  (1.6 m), weight 73.6 kg, last menstrual period 10/10/2020.  Physical Exam Vitals and nursing note reviewed.  Constitutional:      Appearance: Normal appearance. She is normal weight.  Cardiovascular:     Rate and Rhythm: Normal rate.  Pulmonary:     Effort: Pulmonary effort is normal.  Genitourinary:    Comments: Not indicated Musculoskeletal:        General: Normal range of motion.     Cervical back: Normal range of motion.  Neurological:     Mental Status: She is alert and oriented to person, place, and time.  Psychiatric:  Mood and Affect: Mood normal.        Behavior: Behavior normal.        Thought Content: Thought content normal.        Judgment: Judgment normal.   FHTs by doppler: 142 bpm  Reassessment @ 2230: H/A is improving, "but I feel like I might need to eat." MAU Course  Procedures  MDM CCUA Zofran 8 mg ODT Tylenol 1000 mg --- H/A improving  Results for orders placed or performed during the hospital encounter of 02/07/21 (from the past 24 hour(s))  Urinalysis, Routine w reflex microscopic Urine, Clean Catch     Status: None   Collection Time: 02/07/21  8:03 PM  Result Value Ref Range   Color, Urine YELLOW YELLOW   APPearance CLEAR CLEAR   Specific Gravity, Urine 1.010 1.005 - 1.030   pH 6.0 5.0 - 8.0   Glucose, UA NEGATIVE NEGATIVE mg/dL   Hgb urine dipstick NEGATIVE NEGATIVE   Bilirubin Urine NEGATIVE NEGATIVE   Ketones, ur NEGATIVE NEGATIVE mg/dL   Protein, ur NEGATIVE NEGATIVE mg/dL   Nitrite  NEGATIVE NEGATIVE   Leukocytes,Ua NEGATIVE NEGATIVE    Assessment and Plan  Pregnancy headache in second trimester  - Information provided on general H/A without cause and H/A form - Advised to take medications on safe medication list provided for symptoms   Nausea and vomiting during pregnancy prior to [redacted] weeks gestation - Safe Meds in Pg List given - Advised to take medications on safe medication list provided for symptoms  [redacted] weeks gestation of pregnancy   - Discharge patient - Keep scheduled appt with [facility] on 02/25/21 - Patient verbalized an understanding of the plan of care and agrees.   Raelyn Mora, CNM 02/07/2021, 9:25 PM

## 2021-02-14 ENCOUNTER — Other Ambulatory Visit: Payer: Self-pay

## 2021-02-14 ENCOUNTER — Encounter (HOSPITAL_COMMUNITY): Payer: Self-pay | Admitting: Obstetrics and Gynecology

## 2021-02-14 ENCOUNTER — Inpatient Hospital Stay (HOSPITAL_COMMUNITY)
Admission: AD | Admit: 2021-02-14 | Discharge: 2021-02-14 | Disposition: A | Discharge status: Home / Self Care | Payer: Medicaid Other | Attending: Obstetrics and Gynecology | Admitting: Obstetrics and Gynecology

## 2021-02-14 ENCOUNTER — Encounter: Payer: Self-pay | Admitting: Certified Nurse Midwife

## 2021-02-14 DIAGNOSIS — R109 Unspecified abdominal pain: Secondary | ICD-10-CM | POA: Insufficient documentation

## 2021-02-14 DIAGNOSIS — O26892 Other specified pregnancy related conditions, second trimester: Secondary | ICD-10-CM | POA: Diagnosis not present

## 2021-02-14 DIAGNOSIS — Z3480 Encounter for supervision of other normal pregnancy, unspecified trimester: Secondary | ICD-10-CM

## 2021-02-14 DIAGNOSIS — Z3A18 18 weeks gestation of pregnancy: Secondary | ICD-10-CM | POA: Diagnosis not present

## 2021-02-14 DIAGNOSIS — Z87891 Personal history of nicotine dependence: Secondary | ICD-10-CM | POA: Insufficient documentation

## 2021-02-14 DIAGNOSIS — R102 Pelvic and perineal pain: Secondary | ICD-10-CM | POA: Diagnosis not present

## 2021-02-14 DIAGNOSIS — O99612 Diseases of the digestive system complicating pregnancy, second trimester: Secondary | ICD-10-CM

## 2021-02-14 DIAGNOSIS — K5900 Constipation, unspecified: Secondary | ICD-10-CM

## 2021-02-14 DIAGNOSIS — N949 Unspecified condition associated with female genital organs and menstrual cycle: Secondary | ICD-10-CM

## 2021-02-14 LAB — URINALYSIS, ROUTINE W REFLEX MICROSCOPIC
Bilirubin Urine: NEGATIVE
Glucose, UA: NEGATIVE mg/dL
Hgb urine dipstick: NEGATIVE
Ketones, ur: NEGATIVE mg/dL
Leukocytes,Ua: NEGATIVE
Nitrite: NEGATIVE
Protein, ur: NEGATIVE mg/dL
Specific Gravity, Urine: 1.019 (ref 1.005–1.030)
pH: 5 (ref 5.0–8.0)

## 2021-02-14 MED ORDER — POLYETHYLENE GLYCOL 3350 17 G PO PACK: 17.0000 g | PACK | Freq: Every day | ORAL | 0 refills | Status: DC

## 2021-02-14 MED ORDER — DOCUSATE SODIUM 100 MG PO CAPS: 100.0000 mg | ORAL_CAPSULE | Freq: Two times a day (BID) | ORAL | 2 refills | Status: DC

## 2021-02-14 MED ORDER — ACETAMINOPHEN 500 MG PO TABS
1000.0000 mg | ORAL_TABLET | Freq: Once | ORAL | Status: AC
  Administered 2021-02-14: 1000 mg via ORAL
  Filled 2021-02-14: qty 2

## 2021-02-14 NOTE — Discharge Instructions (Signed)
You have constipation which is hard stools that are difficult to pass. It is important to have regular bowel movements every 1-3 days that are soft and easy to pass. Hard stools increase your risk of hemorrhoids and are very uncomfortable.   To prevent constipation you can increase the amount of fiber in your diet. Examples of foods with fiber are leafy greens, whole grain breads, oatmeal and other grains.  It is also important to drink at least eight 8oz glass of water everyday.   If you have not has a bowel movement in 4-5 days you made need to clean out your bowel.  This will have establish normal movement through your bowel.    Miralax Clean out Take 8 capfuls of miralax in 64 oz of gatorade. You can use any fluid that appeals to you (gatorade, water, juice) Continue to drink at least eight 8 oz glasses of water throughout the day You can repeat with another 8 capfuls of miralax in 64 oz of gatorade if you are not having a large amount of stools You will need to be at home and close to a bathroom for about 8 hours when you do the above as you may need to go to the bathroom frequently.   After you are cleaned out: - Start Colace100mg  twice daily - Start Miralax once daily - You can safely use enemas in pregnancy  - if you are having diarrhea you can reduce to Colace once a day or miralax every other day or a 1/2 capful daily.

## 2021-02-14 NOTE — MAU Note (Signed)
Brandi [name] is a 25 y.o. at [redacted]w[redacted]d here in MAU reporting: left sided abdominal pain since Saturday. Pain is constant. No abnormal discharge or bleeding.   Onset of complaint: ongoing  Pain score: 10/10  Vitals:   02/14/21 1853  BP: 110/65  Pulse: 79  Resp: 16  Temp: 97.9 F (36.6 C)  SpO2: 100%     FHT:146  Lab orders placed from triage: UA

## 2021-02-14 NOTE — MAU Provider Note (Signed)
History     CSN: [id number]  Arrival date and time: 02/14/21 1829   Event Date/Time   First Provider Initiated Contact with Patient 02/14/21 1939      Chief Complaint  Patient presents with   Abdominal Pain   Brandi [name] is a 25 y.o. G2P0010 at [redacted]w[redacted]d who receives care at [facility]  She presents today for Abdominal Pain.  She states she started having lower left side abdominal pain Saturday night around 7pm.  She states the pain is constant and she describes it as a "pulling."  She states it feels like a "spring."  She reports the pain is worsened with forceful movements like sneezing or moving, but also sitting.  She reports it is improved with usage of a belly support band.  She denies vaginal bleeding or discharge that is abnormal. She rates the pain is 10/10 and reports she has not tried any OTC medications because she didn't know what to take. She endorses perception of flutters.    OB History     Gravida  2   Para      Term      Preterm      AB  1   Living         SAB  1   IAB      Ectopic      Multiple      Live Births              Past Medical History:  Diagnosis Date   Anxiety    Depression    HSV-1 infection    Onychomycosis 12/16/2020   PTSD (post-traumatic stress disorder)    Tinea corporis 01/11/2021    Past Surgical History:  Procedure Laterality Date   HERNIA REPAIR     ovarian cyst Left    simple 4 cm    Family History  Problem Relation Age of Onset   Heart disease Mother    Stroke Mother    Hypertension Mother    Bipolar disorder Mother    Healthy Father    Anxiety disorder Sister    Depression Sister    Hypertension Sister    Cancer Maternal Grandmother    Hypertension Maternal Grandmother    Cancer Maternal Grandfather    Stroke Maternal Grandfather    Depression Paternal Grandmother    Hypertension Paternal Grandmother     Social History   Tobacco Use   Smoking status: Former    Packs/day: 0.50     Types: Cigarettes    Passive exposure: Never   Smokeless tobacco: Never  Vaping Use   Vaping Use: Never used  Substance Use Topics   Alcohol use: Not Currently    Comment: occasional   Drug use: Never    Allergies: No Known Allergies  Medications Prior to Admission  Medication Sig Dispense Refill Last Dose   ondansetron (ZOFRAN ODT) 4 MG disintegrating tablet Take 1 tablet (4 mg total) by mouth every 8 (eight) hours as needed for up to 4 doses for nausea or vomiting. 20 tablet 0 Past Month   Prenatal Vit-Fe Fumarate-FA (PRENATAL MULTIVITAMIN) TABS tablet Take 1 tablet by mouth daily at 12 noon.   02/14/2021    Review of Systems  Constitutional:  Negative for chills and fever.  Gastrointestinal:  Positive for abdominal pain (Left Lower Side) and constipation (2 days ago, hard to pass). Negative for diarrhea, nausea and vomiting.  Genitourinary:  Negative for difficulty urinating, dysuria, vaginal bleeding and vaginal discharge.  Musculoskeletal:  Negative for back pain.  Neurological:  Negative for dizziness, light-headedness and headaches.  Physical Exam   Blood pressure 110/65, pulse 79, temperature 97.9 F (36.6 C), temperature source Oral, resp. rate 16, height 5\' 3"  (1.6 m), weight 73.9 kg, last menstrual period 10/10/2020, SpO2 100 %.  Physical Exam Vitals reviewed.  Constitutional:      Appearance: She is well-developed.  HENT:     Head: Normocephalic and atraumatic.  Eyes:     Conjunctiva/sclera: Conjunctivae normal.  Pulmonary:     Effort: Pulmonary effort is normal. No respiratory distress.     Breath sounds: Normal breath sounds.  Abdominal:     General: Bowel sounds are normal.     Palpations: Abdomen is soft.     Tenderness: There is abdominal tenderness.  Skin:    General: Skin is warm and dry.  Neurological:     Mental Status: She is alert and oriented to person, place, and time.  Psychiatric:        Mood and Affect: Mood normal.        Behavior:  Behavior normal.        Thought Content: Thought content normal.    MAU Course  Procedures No results found for this or any previous visit (from the past 24 hour(s)).  MDM Labs:UA Analgesic  Assessment and Plan  25 year old G2P0010 SIUP at 18 weeks Round Ligament Pain  -POC Reviewed. -Exam performed and findings discussed. -Extensive discussion regarding round ligament pain and how it occurs. -Informed that constant nature is likely from early sciatica and constipation causing irritation to area. -Patient agreeable to tylenol and heating pad. -Will monitor and reassess.   22 02/14/2021, 7:39 PM   Reassessment (9:03 PM)  -Patient reports pain has improved, but she has not gotten out of bed.  -Patient instructed to get out of bed and ambulate to bathroom. -Upon returning, patient reports pain 5/10. -Patient states pain is manageable. -Discussed starting bowel regimen for constipation. -Rx for Miralax and Colace sent to pharmacy on file.  -Patient and SO without questions or concerns. -Encouraged to call or return to MAU if symptoms worsen or with the onset of new symptoms. -Discharged to home in improved condition.  7/10 MSN, CNM Advanced Practice Provider, [facility]

## 2021-02-21 IMAGING — US US OB < 14 WEEKS - US OB TV
1 series · 15 of 28 positions shown · non-contrast
Comparison: None.

CLINICAL DATA: Pregnant, vaginal bleeding, beta HCG 65

EXAM:
OBSTETRIC <14 WK US AND TRANSVAGINAL OB US
TECHNIQUE: Both transabdominal and transvaginal ultrasound examinations were
performed for complete evaluation of the gestation as well as the
maternal uterus, adnexal regions, and pelvic cul-de-sac.
Transvaginal technique was performed to assess early pregnancy.

[Series 1: us ob < 14 weeks - us ob tv · 61 acquisitions, 15 frames shown]
[im 1/61]
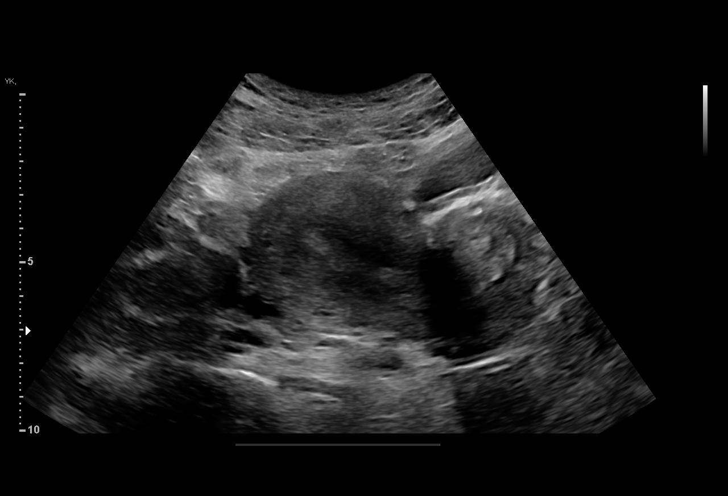
[im 5/61]
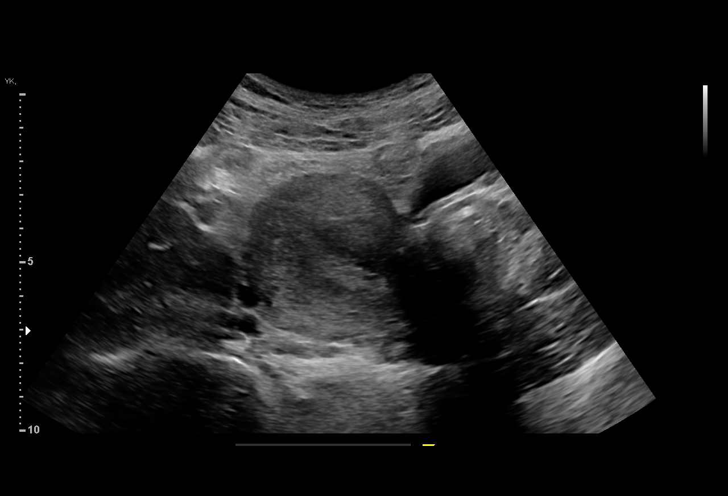
[im 9/61]
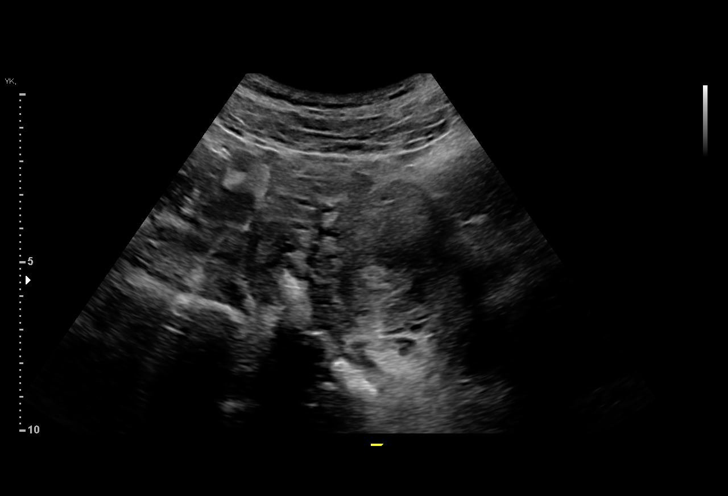
[im 14/61]
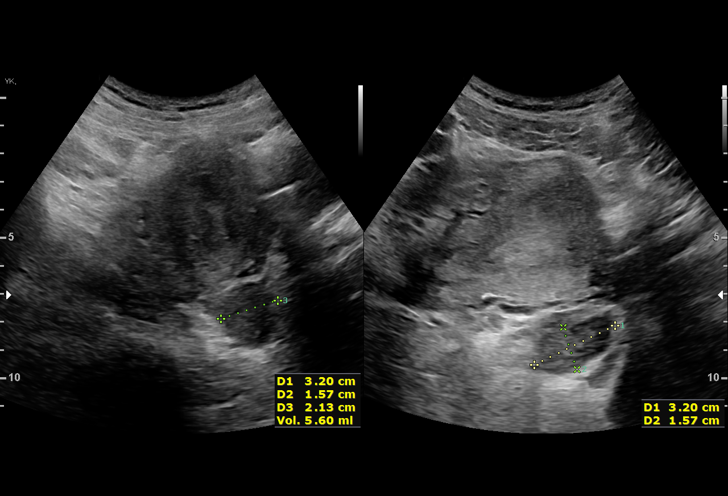
[im 18/61]
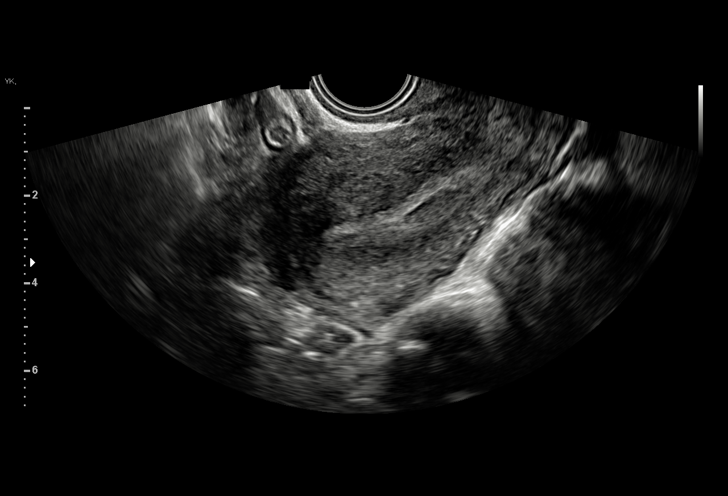
[im 23/61]
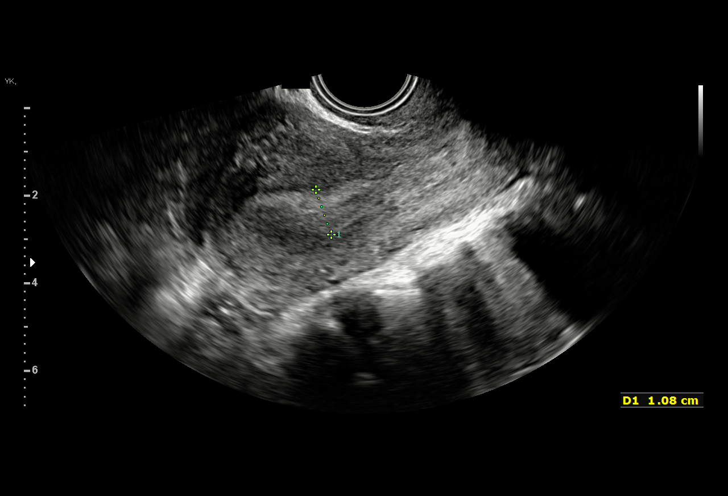
[im 27/61]
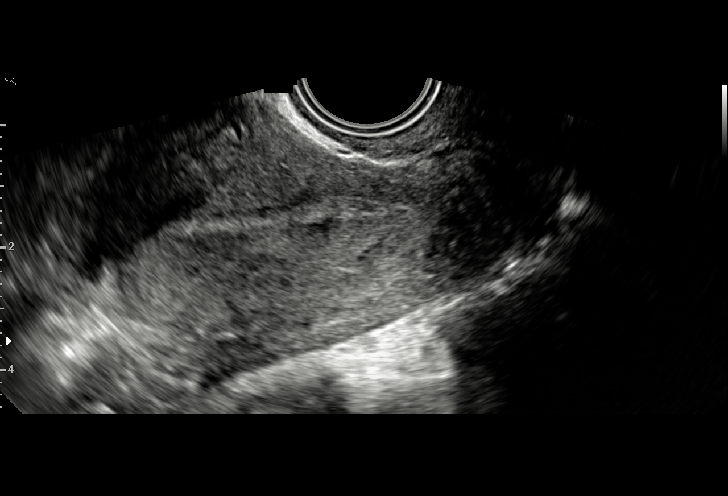
[im 32/61]
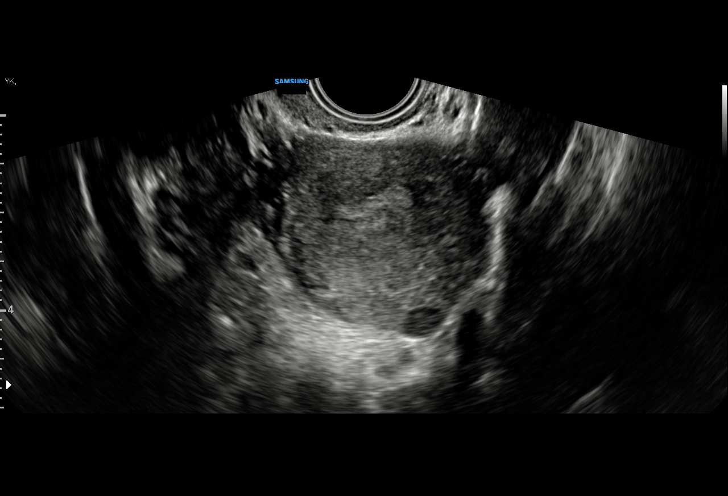
[im 34/61]
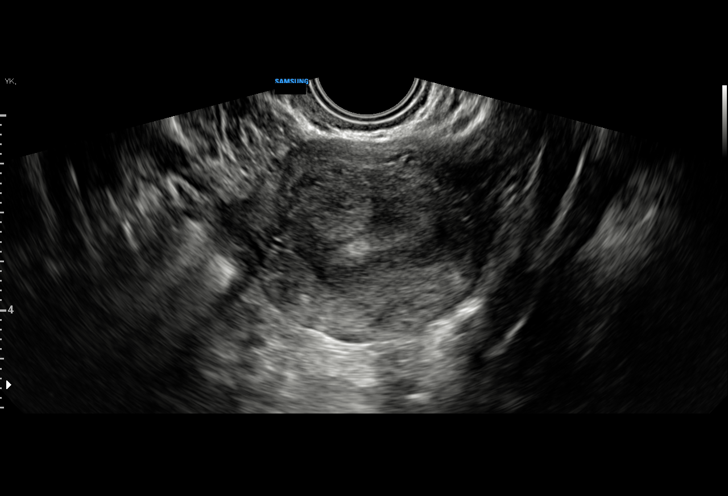
[im 38/61]
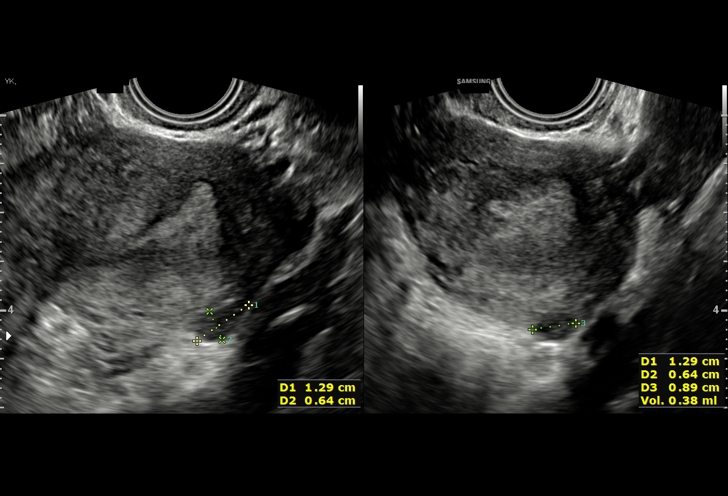
[im 43/61]
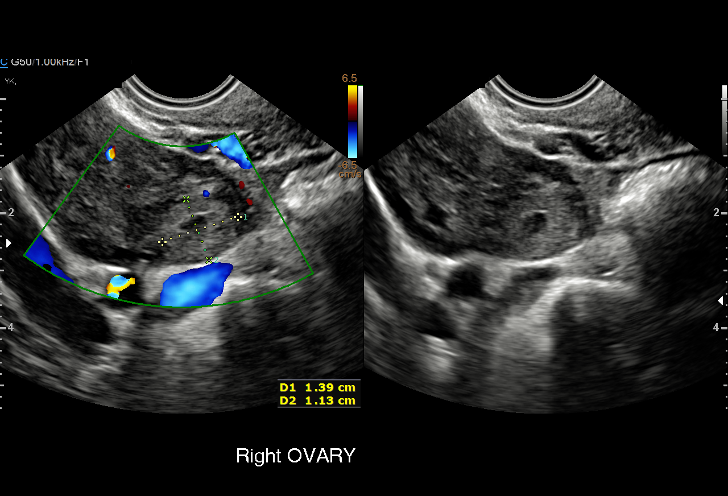
[im 47/61]
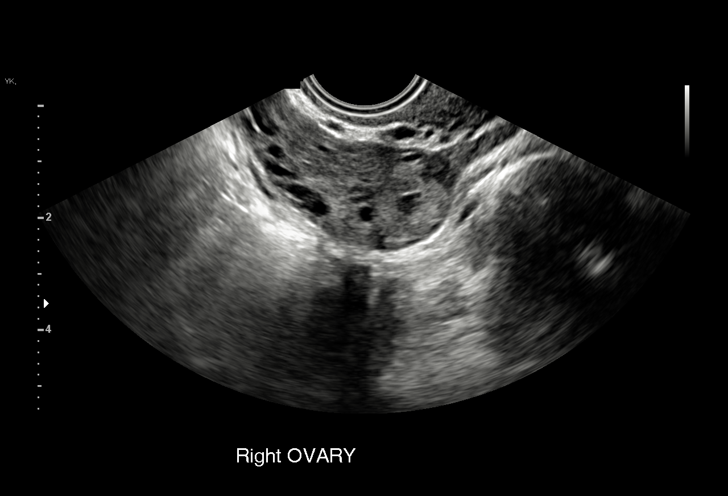
[im 52/61]
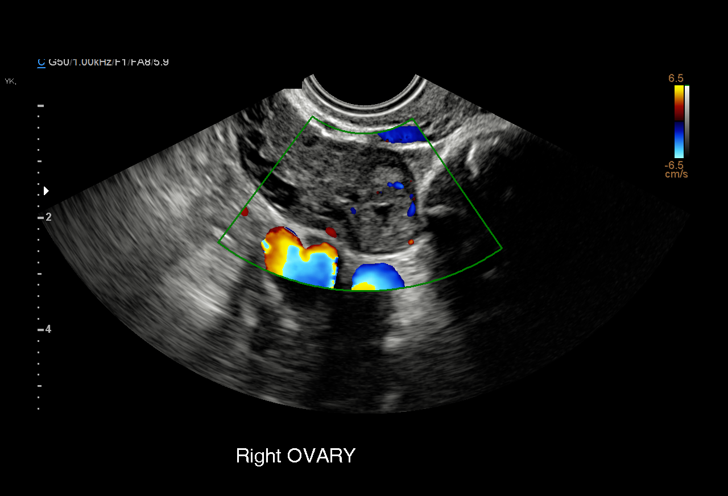
[im 56/61]
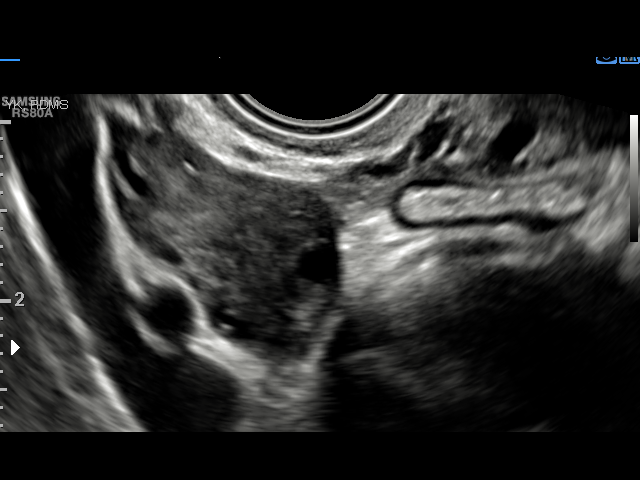
[im 61/61]
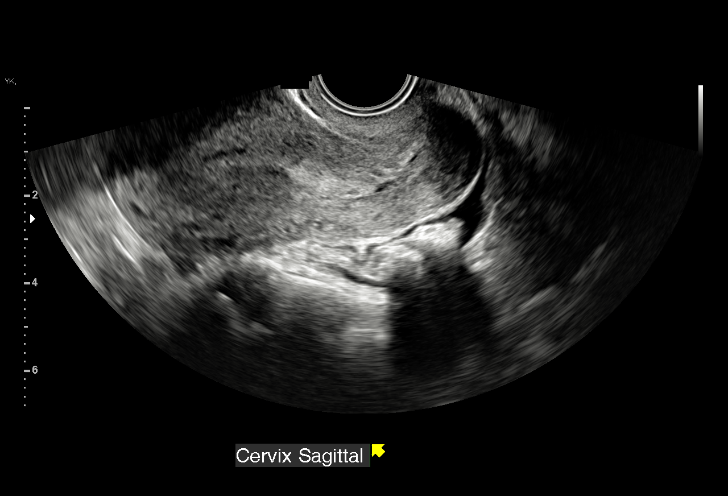

[15 of 28 positions shown; findings below may reference images not displayed]

FINDINGS: Intrauterine gestational sac: None

Maternal uterus/adnexae: 13 x 8 x 14 mm subserosal fibroid in the
right posterior uterine body. 13 x 6 x 9 mm subserosal fibroid in
the left posterior uterine body.

Endometrial complex measures 11 mm.

Left ovary is within normal limits.

On or adjacent to the right ovary is a 14 x 11 x 11 mm echogenic
ring like lesion with peripheral vascularity (image 48). This is
unable to be separated from the ovary on the provided images. If on
the ovary, this would likely reflect a corpus luteum, which is
statistically favored. If adjacent/distinct from the ovary, this
would be worrisome for an ectopic pregnancy.

Small volume pelvic ascites.
IMPRESSION: No IUP is visualized. This is not unexpected given the low beta HCG.

By definition, in the setting of a positive pregnancy test, this
reflects a pregnancy of unknown location. Differential
considerations include early normal IUP, abnormal IUP/missed
abortion, or nonvisualized ectopic pregnancy.

Serial beta HCG is suggested. Consider routine follow-up pelvic
ultrasound in 14 days, or earlier as clinically warranted.

Specifically, in this patient, there is a right adnexal lesion which
statistically favors a corpus luteum. However, if adjacent/distinct
from the right ovary (which could not be proven on this study), this
would be worrisome for ectopic pregnancy. As such, short-term
follow-up pelvic ultrasound in 3-5 days could be considered if there
is inappropriate elevation of beta HCG or continued/worsening pain.

## 2021-02-23 ENCOUNTER — Encounter: Payer: Self-pay | Admitting: Certified Nurse Midwife

## 2021-02-23 ENCOUNTER — Other Ambulatory Visit: Payer: Self-pay

## 2021-02-23 ENCOUNTER — Ambulatory Visit (INDEPENDENT_AMBULATORY_CARE_PROVIDER_SITE_OTHER): Payer: Medicaid Other | Admitting: Certified Nurse Midwife

## 2021-02-23 VITALS — BP 127/74 | HR 91 | Temp 98.9°F | Wt 166.2 lb

## 2021-02-23 DIAGNOSIS — N949 Unspecified condition associated with female genital organs and menstrual cycle: Secondary | ICD-10-CM

## 2021-02-23 DIAGNOSIS — Z3A19 19 weeks gestation of pregnancy: Secondary | ICD-10-CM

## 2021-02-23 DIAGNOSIS — Z3492 Encounter for supervision of normal pregnancy, unspecified, second trimester: Secondary | ICD-10-CM

## 2021-02-23 DIAGNOSIS — Z3480 Encounter for supervision of other normal pregnancy, unspecified trimester: Secondary | ICD-10-CM | POA: Diagnosis not present

## 2021-02-23 DIAGNOSIS — O26899 Other specified pregnancy related conditions, unspecified trimester: Secondary | ICD-10-CM | POA: Diagnosis not present

## 2021-02-23 DIAGNOSIS — O0932 Supervision of pregnancy with insufficient antenatal care, second trimester: Secondary | ICD-10-CM

## 2021-02-23 DIAGNOSIS — R110 Nausea: Secondary | ICD-10-CM

## 2021-02-23 MED ORDER — GOJJI WEIGHT SCALE MISC: 1.0000 | Freq: Every day | 0 refills | Status: DC | PRN

## 2021-02-23 MED ORDER — BLOOD PRESSURE MONITOR AUTOMAT DEVI: 1.0000 | Freq: Every day | 0 refills | Status: DC

## 2021-02-23 NOTE — Progress Notes (Signed)
History:   Brandi [name] is a 25 y.o. G2P0010 at [redacted]w[redacted]d by LMP/early ultrasound being seen today for her first obstetrical visit.  Her obstetrical history is not significant  except for  one miscarriage at 6wks . Patient does intend to breast feed. She desires a waterbirth if possible. Pregnancy history fully reviewed. She started care at [facility] but switched to this office specifically because she likes the midwifery model of care. Would like to be cared for by midwives during labor, is ok with residents being in the room but does not want a student doing her delivery. Understands that if a midwife is unavailable, she will be attended by an OB Fellow. Would always like to try holistic methods before using medications for common complaints of pregnancy.  Patient reports  nausea with occasional vomiting and continued round ligament pain . Has medication prescribed for her nausea but does not take it. Has SeaBands but is not sure she has been using them correctly.   HISTORY: OB History  Gravida Para Term Preterm AB Living  2 0 0 0 1 0  SAB IAB Ectopic Multiple Live Births  1 0 0 0 0    # Outcome Date GA Lbr Len/2nd Weight Sex Delivery Anes PTL Lv  2 Current           1 SAB 08/04/19            Last pap smear was done 12/17/20 and was normal  Past Medical History:  Diagnosis Date   Anxiety    Depression    HSV-1 infection    Onychomycosis 12/16/2020   PTSD (post-traumatic stress disorder)    Tinea corporis 01/11/2021   Past Surgical History:  Procedure Laterality Date   HERNIA REPAIR     ovarian cyst Left    simple 4 cm   Family History  Problem Relation Age of Onset   Heart disease Mother    Stroke Mother    Hypertension Mother    Bipolar disorder Mother    Healthy Father    Anxiety disorder Sister    Depression Sister    Hypertension Sister    Cancer Maternal Grandmother    Hypertension Maternal Grandmother    Cancer Maternal Grandfather    Stroke  Maternal Grandfather    Depression Paternal Grandmother    Hypertension Paternal Grandmother    Social History   Tobacco Use   Smoking status: Former    Packs/day: 0.50    Types: Cigarettes    Passive exposure: Never   Smokeless tobacco: Never  Vaping Use   Vaping Use: Never used  Substance Use Topics   Alcohol use: Not Currently    Comment: occasional   Drug use: Never   No Known Allergies Current Outpatient Medications on File Prior to Visit  Medication Sig Dispense Refill   docusate sodium (COLACE) 100 MG capsule Take 1 capsule (100 mg total) by mouth every 12 (twelve) hours. 60 capsule 2   ondansetron (ZOFRAN ODT) 4 MG disintegrating tablet Take 1 tablet (4 mg total) by mouth every 8 (eight) hours as needed for up to 4 doses for nausea or vomiting. 20 tablet 0   polyethylene glycol (MIRALAX) 17 g packet Take 17 g by mouth daily. 30 each 0   Prenatal Vit-Fe Fumarate-FA (PRENATAL MULTIVITAMIN) TABS tablet Take 1 tablet by mouth daily at 12 noon.     No current facility-administered medications on file prior to visit.   Review of Systems Pertinent items noted  in HPI and remainder of comprehensive ROS otherwise negative. Physical Exam:   Vitals:   02/23/21 1414  BP: 127/74  Pulse: 91  Temp: 98.9 F (37.2 C)  Weight: 166 lb 3.2 oz (75.4 kg)   Fetal Heart Rate (bpm): 145  Constitutional: Well-developed, well-nourished pregnant female in no acute distress.  HEENT: PERRLA Skin: normal color and turgor, no rash Cardiovascular: normal rate & rhythm, no murmur Respiratory: normal effort, lung sounds clear throughout GI: Abd soft, non-tender, pos BS x 4, gravid appropriate for gestational age MS: Extremities nontender, no edema, normal ROM Neurologic: Alert and oriented x 4.  GU: no CVA tenderness Pelvic: Exam deferred  Assessment & Plan:  1. Supervision of low-risk pregnancy, second trimester - Doing well overall, starting to fetal movement - Blood Pressure  Monitoring (BLOOD PRESSURE MONITOR AUTOMAT) DEVI; 1 Device by Does not apply route daily. Automatic blood pressure cuff regular size. To monitor blood pressure regularly at home. ICD-10 code:Z34.90  Dispense: 1 each; Refill: 0 - Misc. Devices (GOJJI WEIGHT SCALE) MISC; 1 Device by Does not apply route daily as needed. To weight self daily as needed at home. ICD-10 code: Z34.90  Dispense: 1 each; Refill: 0  2. [redacted] weeks gestation of pregnancy - Routine OB care  3. Pregnancy related nausea, antepartum - Has zofran, suggested ginger pills/candy, peppermint, and SeaBands. Showed proper way to wear SeaBands as well as how to activate the acupressure point without the bracelet.  4. Round ligament pain - round ligament massage demonstrated, instructions for stretches given in AVS  5. Initial OB visit - Initial labs reviewed - Continue prenatal vitamins. - Problem list reviewed and updated. - Genetic Screening discussed, First trimester screen, Quad screen, and NIPS: results reviewed. - Ultrasound discussed; fetal anatomic survey:  is scheduled at [place] on 8/29, planning to keep appt and have results faxed over. Will follow up with our MFM if needed . - Anticipatory guidance about prenatal visits given including labs, ultrasounds, and testing. - Discussed usage of Babyscripts and virtual visits as additional source of managing and completing prenatal visits in midst of coronavirus and pandemic.   - Encouraged to complete MyChart Registration for her ability to review results, send requests, and have questions addressed.  - The nature of [place] [facility] Practice with multiple MDs and Advanced Practice Providers was explained to patient; also emphasized that residents, students are part of our team. - Routine obstetric precautions reviewed. Encouraged to seek out care at office or emergency room [facility] MAU preferred) for urgent and/or emergent concerns.  Return  in about 4 weeks (around 03/23/2021) for IN-PERSON, LOB.     Edd Arbour, MSN, CNM, IBCLC Certified Nurse Midwife, [facility]

## 2021-02-23 NOTE — Patient Instructions (Addendum)
Round Ligament Massage & Stretches  Massage: Starting at the middle of your pubic bone, trace little circles in a wide U from your pubic bone to your hip bones on both sides.  Then starting just above your pubic bone, press in and down, alternating sides to create a gentle rocking of your uterus back and forth.  Move your hands up the sides of your belly and back down. Do this 3-5 times upon waking and before bed.  Stretches: Get on hands and knees and alternate arching your back deeply while inhaling, and then rounding your back while exhaling. Modified runners lunge:  - Sit on a chair with half of your bottom on the chair and half off.  - Sit up tall, plant your front foot, and stretch your other foot out behind you.  - Breathe deeply for 5 breaths and then do the other side.  AREA PEDIATRIC/FAMILY PRACTICE PHYSICIANS  [facility] [address] [place], [place] [address] Phone - [phone]   Fax - [phone]  JACK AMOS [address] [place], [place]  [address] Phone - [phone]   Fax - [phone]  [facility] [address] [place], [place]  [address] Phone - [phone]   Fax - [phone]  [facility] [address] [place], [place]  [address] Phone - [phone]   Fax - [phone]  [facility] [address] [place], [place]  [address] Phone - [phone]   Fax - [phone]  [facility] [address] [place], [place]  [address] Phone - [phone]   Fax - [phone]  [facility] [place] [address] [place], [place]  [address] Phone - [phone]   Fax - [phone]  [facility] AT [facility] [address] [place], [address] [place], [place]  [address] Phone - [phone]   Fax - [phone]  [facility] AT [facility] [address] [place], [place]  [address] Phone  - [phone]   Fax - [phone] [facility] [place] [address] [place], [place]  [address] Phone - [phone]   Fax - [phone]  [facility] [address] [place], [place]  [address] Phone - [phone]   Fax - [phone]  [facility] [address] [place], [place]  [address] Phone - [phone]   Fax - [phone]  [facility] [place] [address] [place], [place]  [address] Phone - [phone]   Fax - [phone]  [facility] [address] [place], [place]  [address] Phone - [phone]  [facility] [address] [place], [place]  [address] Phone - [phone]   Fax - [phone]  [facility] [address] [place], [place]  [address] Phone - [phone]   Fax - [phone]  [facility] [address] [place], [place]  [address] Phone - [phone]   Fax - [phone]   [facility] [address] [place], [place]  [address] Phone - [phone]   Fax - [phone]   [facility] [address] [place] [address] [place], [place]  [address] Phone - [phone]   Fax - [phone]  [facility] [address] [place], [place]  [address] Phone - [phone]   Fax - [phone]  DAVID RUBIN [address] [place], [place]  [address] Phone - [phone]   Fax - [phone]  [facility] [address] [place], [place]  [address] Phone - [phone]   Fax - [phone]  [place] Alita Chyle [address] [place], [place]  [address] Phone - [phone]   Fax - [phone] Gerarda Fraction [address] [place], [place]  [address] Phone - [phone]   Fax - [phone]  [facility] [address] [place], [place]  [address] Phone - [phone]   Fax -  [phone]  [facility] - [place] [address] [place], [place]  [address] Phone - [phone]   Fax - [phone]

## 2021-03-23 ENCOUNTER — Other Ambulatory Visit: Payer: Self-pay

## 2021-03-23 ENCOUNTER — Ambulatory Visit (INDEPENDENT_AMBULATORY_CARE_PROVIDER_SITE_OTHER): Payer: Medicaid Other | Admitting: Certified Nurse Midwife

## 2021-03-23 VITALS — BP 119/76 | HR 84 | Temp 97.9°F | Wt 171.8 lb

## 2021-03-23 DIAGNOSIS — Z3A23 23 weeks gestation of pregnancy: Secondary | ICD-10-CM

## 2021-03-23 DIAGNOSIS — M5432 Sciatica, left side: Secondary | ICD-10-CM

## 2021-03-23 DIAGNOSIS — Z3480 Encounter for supervision of other normal pregnancy, unspecified trimester: Secondary | ICD-10-CM

## 2021-03-23 MED ORDER — CYCLOBENZAPRINE HCL 10 MG PO TABS
10.0000 mg | ORAL_TABLET | Freq: Three times a day (TID) | ORAL | 1 refills | Status: DC | PRN
Start: 2021-03-23 — End: 2021-07-15

## 2021-03-23 MED ORDER — MAGNESIUM OXIDE -MG SUPPLEMENT 200 MG PO TABS
400.0000 mg | ORAL_TABLET | Freq: Every day | ORAL | 3 refills | Status: DC
Start: 2021-03-23 — End: 2021-07-15

## 2021-03-23 NOTE — Patient Instructions (Signed)
Brandi [name] w/ [name] [address] [place], [place] [address] [phone] [email or web address]  

## 2021-03-25 NOTE — Progress Notes (Signed)
   PRENATAL VISIT NOTE  Subjective:  Brandi [name] is a 25 y.o. G2P0010 at [redacted]w[redacted]d being seen today for ongoing prenatal care.  She is currently monitored for the following issues for this low-risk pregnancy and has Supervision of other normal pregnancy, antepartum on their problem list.  Patient reports  left sided lower abdominal pain that corresponds to lifting her left leg and sciatic pain. It is now causing some decreased mobility, having to shuffle the left foot when walking because of the pain she has lifting her leg .  Contractions: Irregular. Vag. Bleeding: None.  Movement: Present. Denies leaking of fluid.   The following portions of the patient's history were reviewed and updated as appropriate: allergies, current medications, past family history, past medical history, past social history, past surgical history and problem list.   Objective:   Vitals:   03/23/21 1343  BP: 119/76  Pulse: 84  Temp: 97.9 F (36.6 C)  Weight: 171 lb 12.8 oz (77.9 kg)   Fetal Status: Fetal Heart Rate (bpm): 148 Fundal Height: 23 cm Movement: Present     General:  Alert, oriented and cooperative. Patient is in no acute distress.  Skin: Skin is warm and dry. No rash noted.   Cardiovascular: Normal heart rate noted  Respiratory: Normal respiratory effort, no problems with respiration noted  Abdomen: Soft, gravid, appropriate for gestational age.  Pain/Pressure: Present     Pelvic: Cervical exam deferred        Extremities: Normal range of motion.  Edema: None  Mental Status: Normal mood and affect. Normal behavior. Normal judgment and thought content.   Assessment and Plan:  Pregnancy: G2P0010 at [redacted]w[redacted]d 1. Supervision of other normal pregnancy, antepartum - Feeling regular and vigorous fetal movement, very annoyed by the sciatic pain. Noted that when she does the rocking part of the round ligament massage, it stops the abdominal pain. By Leopold's the left side is where the baby's head  sits.  2. [redacted] weeks gestation of pregnancy - Routine OB care including anticipatory guidance re GTT at next visit  3. Sciatic pain, left - Magnesium Oxide (MAG-OXIDE) 200 MG TABS; Take 2 tablets (400 mg total) by mouth at bedtime. If that amount causes loose stools in the am, switch to 200mg  daily at bedtime.  Dispense: 60 tablet; Refill: 3 - cyclobenzaprine (FLEXERIL) 10 MG tablet; Take 1 tablet (10 mg total) by mouth every 8 (eight) hours as needed for muscle spasms.  Dispense: 30 tablet; Refill: 1 - Ambulatory referral to Physical Therapy  Preterm labor symptoms and general obstetric precautions including but not limited to vaginal bleeding, contractions, leaking of fluid and fetal movement were reviewed in detail with the patient. Please refer to After Visit Summary for other counseling recommendations.   Return in about 4 weeks (around 04/20/2021).  Future Appointments  Date Time Provider Department Center  04/22/2021 10:15 AM 04/24/2021, CNM [facility] None  05/11/2021  1:35 PM 13/03/2021, CNM [facility] None  05/25/2021 10:35 AM [facility], 05/27/2021, CNM [facility] None    Wilmer Floor, CNM

## 2021-04-18 ENCOUNTER — Other Ambulatory Visit: Payer: Self-pay

## 2021-04-18 ENCOUNTER — Ambulatory Visit: Payer: Medicaid Other | Attending: Certified Nurse Midwife | Admitting: Physical Therapy

## 2021-04-18 DIAGNOSIS — R269 Unspecified abnormalities of gait and mobility: Secondary | ICD-10-CM | POA: Insufficient documentation

## 2021-04-18 DIAGNOSIS — R293 Abnormal posture: Secondary | ICD-10-CM | POA: Insufficient documentation

## 2021-04-18 DIAGNOSIS — R252 Cramp and spasm: Secondary | ICD-10-CM | POA: Diagnosis not present

## 2021-04-18 NOTE — Therapy (Signed)
[facility] [address] [place], [place], [address] Phone: [phone]   Fax:  [phone]  Physical Therapy Evaluation  Patient Details  Name: Brandi [name] MRN: [id number] Date of Birth: December 22, 1995 Referring Provider (PT): Bernerd Limbo, [place]   Encounter Date: 04/18/2021   PT End of Session - 04/18/21 1457     Visit Number 1    Date for PT Re-Evaluation 07/19/21    Authorization Type [employer]    PT Start Time 1400    PT Stop Time 1450    PT Time Calculation (min) 50 min    Activity Tolerance Patient tolerated treatment well;Patient limited by pain    Behavior During Therapy [facility] for tasks assessed/performed             Past Medical History:  Diagnosis Date   Anxiety    Depression    HSV-1 infection    Onychomycosis 12/16/2020   PTSD (post-traumatic stress disorder)    Tinea corporis 01/11/2021    Past Surgical History:  Procedure Laterality Date   HERNIA REPAIR     ovarian cyst Left    simple 4 cm    There were no vitals filed for this visit.    Subjective Assessment - 04/18/21 1409     Subjective Pt reports Lt hip pain with mobility worse with prolonged sitting, bed mobility and prolonged walking. Pt will have limping with these activities or with initial steps from coming to standing. Pt reports pain is posterior proximial thigh and gluteal. Also worse with single leg activities.    Pertinent History pregnant with first child and h/o miscarriage    How long can you sit comfortably? 15-30 mins    How long can you stand comfortably? 30 mins    How long can you walk comfortably? 30-45 mins    Diagnostic tests no    Patient Stated Goals to have less pain    Currently in Pain? No/denies   at worse pain is a 10/10 if in one position too long               [facility] PT Assessment - 04/18/21 0001       Assessment   Medical Diagnosis M54.32 (ICD-10-CM) - Sciatic pain, left     Referring Provider (PT) Bernerd Limbo, CNM    Onset Date/Surgical Date --   the past few months   Prior Therapy no      Precautions   Precautions Other (comment)    Precaution Comments Pregnant      Restrictions   Weight Bearing Restrictions No      Balance Screen   Has the patient fallen in the past 6 months No    Has the patient had a decrease in activity level because of a fear of falling?  No    Is the patient reluctant to leave their home because of a fear of falling?  No      Home Tourist information centre manager residence    Living Arrangements Spouse/significant other      Prior Function   Level of Independence Independent    Vocation Student    Vocation Requirements early childhood education      Cognition   Overall Cognitive Status Within Functional Limits for tasks assessed      Sensation   Light Touch Appears Intact      Coordination   Gross Motor Movements are Fluid and Coordinated Yes  Fine Motor Movements are Fluid and Coordinated Yes      Posture/Postural Control   Posture/Postural Control Postural limitations    Postural Limitations Rounded Shoulders;Decreased lumbar lordosis;Increased thoracic kyphosis;Posterior pelvic tilt      ROM / Strength   AROM / PROM / Strength AROM;Strength      AROM   Overall AROM Comments thoracic and lumbar spine limited by 25% in side bending, flexion, and rotation      Strength   Overall Strength Comments Lt hip grossly 3/5 due to pain at hip' Rt hip grossly 4/5      Flexibility   Soft Tissue Assessment /Muscle Length yes   bil adductors limited by 25%     Palpation   Spinal mobility rib flare bilaterally with pregancy    Palpation comment TTP at Lt groin and pubic bone. Pt reports pain felt with palpation at site but also "it seems like it's deep, like going into me". Pt shown picture of round ligament pain regions and agreed this was where she felt pain.                         Objective measurements completed on examination: See above findings.     Pelvic Floor Special Questions - 04/18/21 0001     Prior Pelvic/Prostate Exam Yes   no abnormal findings   Are you Pregnant or attempting pregnancy? Yes    Prior Pregnancies Yes   miscarriage   Currently Sexually Active Yes    Is this Painful Yes   pain from Lt hip   History of sexually transmitted disease Yes   HSV1   Marinoff Scale pain interrupts completion    Urinary Leakage Yes    How often infrequent    Activities that cause leaking Sneezing    Urinary urgency Yes    Urinary frequency around 2 hours    Fecal incontinence No    Fluid intake at least a gallon per day    Caffeine beverages no    Falling out feeling (prolapse) No    Skin Integrity Other   pale skin areas at labia noted   Prolapse None    Pelvic Floor Internal Exam patient identified and patient confirms consent for PT to perform internal soft tissue work and muscle strength and integrity assessment    Exam Type Vaginal    Sensation WFL    Palpation no TTP internal exam per pt. very mild trigger points noted at obturator internus and iliococcygeus but pt denied pain    Strength strong squeeze, against strong resistance    Strength # of reps 9    Strength # of seconds 10    [facility]                       PT Education - 04/18/21 1451     Education Details Access Code: [email or web address]. Pt educated on exam findings, female pelvic floor with model used, HEP and POC    Person(s) Educated Patient    Methods Explanation;Tactile cues;Demonstration;Verbal cues;Handout    Comprehension Verbalized understanding;Returned demonstration              PT Short Term Goals - 04/18/21 1504       PT SHORT TERM GOAL #1   Title Pt to be I with HEP    Time 5    Period Weeks    Status New    Target Date 05/23/21  PT SHORT TERM GOAL #2   Title pt to report no more than 5/10 pain at Lt hip/pelvis with  mobility to improve ability to stand longer than 30 mins    Time 5    Period Weeks    Target Date 05/23/21               PT Long Term Goals - 04/18/21 1505       PT LONG TERM GOAL #1   Title Pt to be I with advanced HEP    Time 3    Period Months    Status New    Target Date 07/19/21      PT LONG TERM GOAL #2   Title pt to report no more than 2/10 pain at Lt hip/pelvis for improved tolerance to walking longer than 30 mins    Time 3    Period Months    Status New    Target Date 07/19/21      PT LONG TERM GOAL #3   Title pt to demostrate good lifiting mechanics of 15# to decrease risk of injury with carrying for new baby upon delivery.    Time 3    Period Months    Status New    Target Date 07/19/21      PT LONG TERM GOAL #4   Title pt to demonstate bil hip strength to 5/5 for birth prep.    Time 3    Period Months    Status New    Target Date 07/19/21                    Plan - 04/18/21 1459     Clinical Impression Statement Pt is 24yo female currently [redacted]weeks pregnant with first child presenting with Lt hip pain felt at groin, gluteal and pt reports "deep into me" starting a few months ago per pt. Pt reports pain limits her bed mobility, ambulation, transfers and how long she can sit or walk. Pt found to have weakness in bil hips Lt>Rt and pain with Lt and Rt hip resistance to MMT, Lt leg pain with Rt SLR more so than Lt SLR. Pt also have TTP at Lt groin and pubic bone, decreased spinal, hip and rib mobility. Pt consented to internal exam vaginally and found to have 5/5 pelvic strength, good coordination and endurance as well and mild trigger points in Lt side of pelvis however pt denied all pain with this. PT taped pt at belly with Ktape for improved pelvic support and decrease strain of belly on pelvis to decrease pain, pt reported greatly improved ability to complete bed mobility from mat table and come to standing with less pain. Pt educated on tape removal  and to remove with any skin irritation or reddness/itching. pt agreed. Pt would benefit from continued PT to address deficits found at eval.    Personal Factors and Comorbidities Comorbidity 1;Fitness    Comorbidities pregnant    Examination-Activity Limitations Locomotion Level;Transfers;Bed Mobility;Lift;Stand;Stairs;Squat    Examination-Participation Restrictions Community Activity;Shop;Yard Work    Stability/Clinical Decision Making Stable/Uncomplicated    Clinical Decision Making Low    Rehab Potential Good    PT Frequency 1x / week    PT Duration Other (comment)   10 visits   PT Treatment/Interventions ADLs/Self Care Home Management;Aquatic Therapy;Neuromuscular re-education;Therapeutic exercise;Therapeutic activities;Functional mobility training;Patient/family education;Taping;Scar mobilization;Passive range of motion;Energy conservation    PT Next Visit Plan go over HEP, hip stretching, manual at hip    PT  Home Exercise Plan [facility]    Consulted and Agree with Plan of Care Patient             Patient will benefit from skilled therapeutic intervention in order to improve the following deficits and impairments:  Difficulty walking, Pain, Improper body mechanics, Impaired flexibility, Decreased mobility, Decreased strength, Postural dysfunction  Visit Diagnosis: Cramp and spasm - Plan: PT plan of care cert/re-cert  Abnormality of gait and mobility - Plan: PT plan of care cert/re-cert  Abnormal posture - Plan: PT plan of care cert/re-cert     Problem List Patient Active Problem List   Diagnosis Date Noted   Supervision of other normal pregnancy, antepartum 02/14/2021   No emotional/communication barriers or cognitive limitation. Patient is motivated to learn. Patient understands and agrees with treatment goals and plan. PT explains patient will be examined in standing, sitting, and lying down to see how their muscles and joints work. When they are ready, they will be asked  to remove their underwear so PT can examine their perineum. The patient is also given the option of providing their own chaperone as one is not provided in our facility. The patient also has the right and is explained the right to defer or refuse any part of the evaluation or treatment including the internal exam. With the patient's consent, PT will use one gloved finger to gently assess the muscles of the pelvic floor, seeing how well it contracts and relaxes and if there is muscle symmetry. After, the patient will get dressed and PT and patient will discuss exam findings and plan of care. PT and patient discuss plan of care, schedule, attendance policy and HEP activities.   Otelia Sergeant, PT, DPT 10/17/223:11 PM   [facility] [address] [place], [place], [address] Phone: [phone]   Fax:  [phone]  Name: Brandi [name] MRN: [id number] Date of Birth: 1996/03/20

## 2021-04-20 ENCOUNTER — Other Ambulatory Visit: Payer: Self-pay

## 2021-04-20 ENCOUNTER — Ambulatory Visit (INDEPENDENT_AMBULATORY_CARE_PROVIDER_SITE_OTHER): Payer: Medicaid Other

## 2021-04-20 VITALS — BP 128/84 | HR 80 | Temp 97.8°F | Wt 178.0 lb

## 2021-04-20 DIAGNOSIS — Z3480 Encounter for supervision of other normal pregnancy, unspecified trimester: Secondary | ICD-10-CM

## 2021-04-20 DIAGNOSIS — Z3A27 27 weeks gestation of pregnancy: Secondary | ICD-10-CM

## 2021-04-20 NOTE — Patient Instructions (Signed)

## 2021-04-20 NOTE — Progress Notes (Signed)
   PRENATAL VISIT NOTE  Subjective:  Brandi [name] is a 25 y.o. G2P0010 at [redacted]w[redacted]d being seen today for ongoing prenatal care.  She is currently monitored for the following issues for this low-risk pregnancy and has Supervision of other normal pregnancy, antepartum on their problem list.  Patient reports no complaints.  Contractions: Irritability. Vag. Bleeding: None.  Movement: Present. Denies leaking of fluid.   The following portions of the patient's history were reviewed and updated as appropriate: allergies, current medications, past family history, past medical history, past social history, past surgical history and problem list.   Objective:   Vitals:   04/20/21 0812  BP: 128/84  Pulse: 80  Temp: 97.8 F (36.6 C)  Weight: 178 lb (80.7 kg)    Fetal Status: Fetal Heart Rate (bpm): 136 Fundal Height: 28 cm Movement: Present     General:  Alert, oriented and cooperative. Patient is in no acute distress.  Skin: Skin is warm and dry. No rash noted.   Cardiovascular: Normal heart rate noted  Respiratory: Normal respiratory effort, no problems with respiration noted  Abdomen: Soft, gravid, appropriate for gestational age.  Pain/Pressure: Present     Pelvic: Cervical exam deferred        Extremities: Normal range of motion.  Edema: None  Mental Status: Normal mood and affect. Normal behavior. Normal judgment and thought content.   Assessment and Plan:  Pregnancy: G2P0010 at [redacted]w[redacted]d 1. Supervision of other normal pregnancy, antepartum  -Patient reports PT has helped and taping has almost eliminated pain. Encouraged patient to have PT teach her to put on tape herself -Completed waterbirth class yesterday and will send completion through MyChart. Will consent closer to 36 weeks. Reviewed contraindications at length -Discussed CNMs in practice and possibly not knowing who will deliver her. Discussed that CNMs practice similarly and can respect her desires for natural labor. Patient  working with doula to establish birth plan and preferences while in labor. -Desires Tdap today, considering Flu -Patient has anterior placenta and sometimes is concerned about movement. Discussed strategies to encourage movement and when to come to MAU  - Glucose Tolerance, 2 Hours w/1 Hour - CBC - RPR - HIV antibody (with reflex)  2. [redacted] weeks gestation of pregnancy  - Glucose Tolerance, 2 Hours w/1 Hour - CBC - RPR - HIV antibody (with reflex)  Preterm labor symptoms and general obstetric precautions including but not limited to vaginal bleeding, contractions, leaking of fluid and fetal movement were reviewed in detail with the patient. Please refer to After Visit Summary for other counseling recommendations.   Return in about 2 weeks (around 05/04/2021) for Return OB visit.  Future Appointments  Date Time Provider Department Center  04/27/2021 10:15 AM Barbaraann Faster, PT [facility] None  05/06/2021 11:00 AM Barbaraann Faster, PT [facility] None  05/11/2021  1:35 PM Bernerd Limbo, CNM [facility] None  05/13/2021 11:00 AM Barbaraann Faster, PT [facility] None  05/20/2021 10:15 AM Barbaraann Faster, PT [facility] None  05/23/2021 11:00 AM Barbaraann Faster, PT [facility] None  05/25/2021 10:35 AM Leftwich-Kirby, Wilmer Floor, CNM [facility] None  05/30/2021 12:30 PM Barbaraann Faster, PT [facility] None  06/06/2021 12:30 PM Barbaraann Faster, PT [facility] None  06/13/2021 11:45 AM Barbaraann Faster, PT [facility] None  06/20/2021 11:45 AM Barbaraann Faster, PT [facility] None  07/08/2021 11:00 AM Barbaraann Faster, PT [facility] None    Rolm Bookbinder, [place] 04/20/21 8:54 AM

## 2021-04-21 LAB — RPR: RPR Ser Ql: NONREACTIVE

## 2021-04-21 LAB — CBC
Hematocrit: 37.2 % (ref 34.0–46.6)
Hemoglobin: 13.1 g/dL (ref 11.1–15.9)
MCH: 30.8 pg (ref 26.6–33.0)
MCHC: 35.2 g/dL (ref 31.5–35.7)
MCV: 88 fL (ref 79–97)
Platelets: 237 10*3/uL (ref 150–450)
RBC: 4.25 x10E6/uL (ref 3.77–5.28)
RDW: 13 % (ref 11.7–15.4)
WBC: 10.6 10*3/uL (ref 3.4–10.8)

## 2021-04-21 LAB — HIV ANTIBODY (ROUTINE TESTING W REFLEX): HIV Screen 4th Generation wRfx: NONREACTIVE

## 2021-04-21 LAB — GLUCOSE TOLERANCE, 2 HOURS W/ 1HR
Glucose, 1 hour: 110 mg/dL (ref 70–179)
Glucose, 2 hour: 97 mg/dL (ref 70–152)
Glucose, Fasting: 64 mg/dL — ABNORMAL LOW (ref 70–91)

## 2021-04-22 ENCOUNTER — Encounter: Payer: Medicaid Other | Admitting: Certified Nurse Midwife

## 2021-04-27 ENCOUNTER — Ambulatory Visit: Payer: Medicaid Other | Admitting: Physical Therapy

## 2021-04-27 ENCOUNTER — Encounter: Payer: Medicaid Other | Admitting: Certified Nurse Midwife

## 2021-04-27 ENCOUNTER — Other Ambulatory Visit: Payer: Self-pay

## 2021-04-27 DIAGNOSIS — R252 Cramp and spasm: Secondary | ICD-10-CM | POA: Diagnosis not present

## 2021-04-27 DIAGNOSIS — R293 Abnormal posture: Secondary | ICD-10-CM

## 2021-04-27 DIAGNOSIS — R269 Unspecified abnormalities of gait and mobility: Secondary | ICD-10-CM

## 2021-04-27 NOTE — Therapy (Signed)
[facility] @ [facility] [address] [place], [place], [address] Phone: [phone]   Fax:  [phone]  Physical Therapy Treatment  Patient Details  Name: Brandi [name] MRN: [id number] Date of Birth: 07/21/1995 Referring Provider (PT): Bernerd Limbo, [place]   Encounter Date: 04/27/2021   PT End of Session - 04/27/21 1208     Visit Number 2    Date for PT Re-Evaluation 07/19/21    Authorization Type [employer]    PT Start Time 1015    PT Stop Time 1058    PT Time Calculation (min) 43 min    Activity Tolerance Patient tolerated treatment well;Patient limited by pain    Behavior During Therapy [facility] for tasks assessed/performed             Past Medical History:  Diagnosis Date   Anxiety    Depression    HSV-1 infection    Onychomycosis 12/16/2020   PTSD (post-traumatic stress disorder)    Tinea corporis 01/11/2021    Past Surgical History:  Procedure Laterality Date   HERNIA REPAIR     ovarian cyst Left    simple 4 cm    There were no vitals filed for this visit.   Subjective Assessment - 04/27/21 1015     Subjective The tape really helped, able to get out of bed without pain with it. Pt saw chiropractor and had pain after a leg manipulation and wasn't sure if this would help or not.    Pertinent History pregnant with first child and h/o miscarriage    How long can you sit comfortably? 30 mins    How long can you stand comfortably? 30 mins    How long can you walk comfortably? 30-45 mins    Diagnostic tests no    Patient Stated Goals to have less pain                               [facility] Adult PT Treatment/Exercise - 04/27/21 0001       Self-Care   Self-Care Other Self-Care Comments    Other Self-Care Comments  Pt educated on k-taping and when/how to remove as well as applying at home as needed. Pt also educated on birthing positions with handout given and breathing mechanics. HEP  also educated on and updated.      Neuro Re-ed    Neuro Re-ed Details  pt directed in NMRE with diaphragmatic breathing 2x10 with tactile and VC given for improved activation of core with active breathing for birth prep.      Manual Therapy   Manual Therapy Taping    Manual therapy comments K-tape applied at belly for support and decreased strain at pelvis for decreased pain. Pt reports quickly feeling better and able to get on/off of mat table much easier with this.                     PT Education - 04/27/21 1208     Education Details Pt educated on taping technique, birthing positions, breathing technique. and updated HEP    Person(s) Educated Patient    Methods Explanation;Demonstration;Tactile cues;Verbal cues;Handout    Comprehension Returned demonstration;Verbalized understanding              PT Short Term Goals - 04/18/21 1504       PT SHORT TERM GOAL #1   Title Pt to be I with  HEP    Time 5    Period Weeks    Status New    Target Date 05/23/21      PT SHORT TERM GOAL #2   Title pt to report no more than 5/10 pain at Lt hip/pelvis with mobility to improve ability to stand longer than 30 mins    Time 5    Period Weeks    Target Date 05/23/21               PT Long Term Goals - 04/18/21 1505       PT LONG TERM GOAL #1   Title Pt to be I with advanced HEP    Time 3    Period Months    Status New    Target Date 07/19/21      PT LONG TERM GOAL #2   Title pt to report no more than 2/10 pain at Lt hip/pelvis for improved tolerance to walking longer than 30 mins    Time 3    Period Months    Status New    Target Date 07/19/21      PT LONG TERM GOAL #3   Title pt to demostrate good lifiting mechanics of 15# to decrease risk of injury with carrying for new baby upon delivery.    Time 3    Period Months    Status New    Target Date 07/19/21      PT LONG TERM GOAL #4   Title pt to demonstate bil hip strength to 5/5 for birth prep.    Time  3    Period Months    Status New    Target Date 07/19/21                   Plan - 04/27/21 1209     Clinical Impression Statement Pt presents to clinic reporting taping last session really helped pain levels and able to get/out of bed without as much pain. Pt session focused on taping abdomen for support and decreased pelvic pain, educated pt on how to do this at home, educating on birth positions and phases of labor with handout given. Pt directed in diaphragmatic breathing techniquies for birth prep as well. Pt would benefit from continued PT to address deficits found at eval    Personal Factors and Comorbidities Comorbidity 1;Fitness    Comorbidities pregnant    Examination-Activity Limitations Locomotion Level;Transfers;Bed Mobility;Lift;Stand;Stairs;Squat    Examination-Participation Restrictions Community Activity;Shop;Yard Work    Stability/Clinical Decision Making Stable/Uncomplicated    Rehab Potential Good    PT Frequency 1x / week    PT Duration Other (comment)   10 visits   PT Treatment/Interventions ADLs/Self Care Home Management;Aquatic Therapy;Neuromuscular re-education;Therapeutic exercise;Therapeutic activities;Functional mobility training;Patient/family education;Taping;Scar mobilization;Passive range of motion;Energy conservation    PT Next Visit Plan go over HEP, hip stretching, manual at hip    PT Home Exercise Plan [facility]    Consulted and Agree with Plan of Care Patient             Patient will benefit from skilled therapeutic intervention in order to improve the following deficits and impairments:  Difficulty walking, Pain, Improper body mechanics, Impaired flexibility, Decreased mobility, Decreased strength, Postural dysfunction  Visit Diagnosis: Cramp and spasm  Abnormality of gait and mobility  Abnormal posture     Problem List Patient Active Problem List   Diagnosis Date Noted   Supervision of other normal pregnancy, antepartum  02/14/2021    Thamas Jaegers  [place], PT 04/27/2021, 12:11 PM  [facility] @ [facility] [address] [place], [place], [address] Phone: [phone]   Fax:  [phone]  Name: Lyndall Bellot MRN: [id number] Date of Birth: Jun 16, 1996

## 2021-05-04 ENCOUNTER — Inpatient Hospital Stay (HOSPITAL_BASED_OUTPATIENT_CLINIC_OR_DEPARTMENT_OTHER): Payer: Medicaid Other

## 2021-05-04 ENCOUNTER — Other Ambulatory Visit: Payer: Self-pay

## 2021-05-04 ENCOUNTER — Encounter (HOSPITAL_COMMUNITY): Payer: Self-pay | Admitting: Obstetrics & Gynecology

## 2021-05-04 ENCOUNTER — Inpatient Hospital Stay (HOSPITAL_COMMUNITY)
Admission: AD | Admit: 2021-05-04 | Discharge: 2021-05-04 | Disposition: A | Discharge status: Home / Self Care | Payer: Medicaid Other | Attending: Obstetrics & Gynecology | Admitting: Obstetrics & Gynecology

## 2021-05-04 DIAGNOSIS — Z87891 Personal history of nicotine dependence: Secondary | ICD-10-CM | POA: Insufficient documentation

## 2021-05-04 DIAGNOSIS — O26893 Other specified pregnancy related conditions, third trimester: Secondary | ICD-10-CM | POA: Diagnosis present

## 2021-05-04 DIAGNOSIS — O4290 Premature rupture of membranes, unspecified as to length of time between rupture and onset of labor, unspecified weeks of gestation: Secondary | ICD-10-CM | POA: Diagnosis not present

## 2021-05-04 DIAGNOSIS — O4703 False labor before 37 completed weeks of gestation, third trimester: Secondary | ICD-10-CM | POA: Diagnosis not present

## 2021-05-04 DIAGNOSIS — O4700 False labor before 37 completed weeks of gestation, unspecified trimester: Secondary | ICD-10-CM

## 2021-05-04 DIAGNOSIS — M549 Dorsalgia, unspecified: Secondary | ICD-10-CM | POA: Insufficient documentation

## 2021-05-04 DIAGNOSIS — R102 Pelvic and perineal pain: Secondary | ICD-10-CM | POA: Insufficient documentation

## 2021-05-04 DIAGNOSIS — R109 Unspecified abdominal pain: Secondary | ICD-10-CM | POA: Insufficient documentation

## 2021-05-04 DIAGNOSIS — Z0371 Encounter for suspected problem with amniotic cavity and membrane ruled out: Secondary | ICD-10-CM | POA: Diagnosis not present

## 2021-05-04 DIAGNOSIS — Z3A29 29 weeks gestation of pregnancy: Secondary | ICD-10-CM

## 2021-05-04 HISTORY — DX: Urinary tract infection, site not specified: N39.0

## 2021-05-04 LAB — URINALYSIS, MICROSCOPIC (REFLEX)

## 2021-05-04 LAB — URINALYSIS, ROUTINE W REFLEX MICROSCOPIC
Bilirubin Urine: NEGATIVE
Glucose, UA: NEGATIVE mg/dL
Hgb urine dipstick: NEGATIVE
Ketones, ur: NEGATIVE mg/dL
Nitrite: NEGATIVE
Protein, ur: NEGATIVE mg/dL
Specific Gravity, Urine: 1.01 (ref 1.005–1.030)
pH: 6.5 (ref 5.0–8.0)

## 2021-05-04 LAB — WET PREP, GENITAL
Clue Cells Wet Prep HPF POC: NONE SEEN
Sperm: NONE SEEN
Trich, Wet Prep: NONE SEEN
Yeast Wet Prep HPF POC: NONE SEEN

## 2021-05-04 LAB — AMNISURE RUPTURE OF MEMBRANE (ROM) NOT AT ARMC: Amnisure ROM: NEGATIVE

## 2021-05-04 LAB — POCT FERN TEST: POCT Fern Test: NEGATIVE

## 2021-05-04 MED ORDER — NIFEDIPINE 10 MG PO CAPS
10.0000 mg | ORAL_CAPSULE | ORAL | Status: DC | PRN
  Administered 2021-05-04: 10 mg via ORAL
  Filled 2021-05-04: qty 1

## 2021-05-04 MED ORDER — LACTATED RINGERS IV BOLUS
1000.0000 mL | Freq: Once | INTRAVENOUS | Status: AC
  Administered 2021-05-04: 1000 mL via INTRAVENOUS

## 2021-05-04 MED ORDER — NITROFURANTOIN MONOHYD MACRO 100 MG PO CAPS: 100.0000 mg | ORAL_CAPSULE | Freq: Two times a day (BID) | ORAL | 0 refills | Status: DC

## 2021-05-04 MED ORDER — PHENAZOPYRIDINE HCL 200 MG PO TABS: 200.0000 mg | ORAL_TABLET | Freq: Three times a day (TID) | ORAL | 0 refills | Status: AC

## 2021-05-04 NOTE — Discharge Instructions (Signed)

## 2021-05-04 NOTE — MAU Note (Addendum)
Has been feeling like a tightening in her stomach, started yesterday.  Woke up out of her sleep with back pain.  Is in PT for sciatica.  Doesn't know if the pain was due to that.  At this time is when the tightening started again.  Around 1000, was talking to her sister, noted a clear fluid on her leg, none since. No bleeding.  Feeling more like a cramping now. Feels like the cramping 'is just there', tightening is inconsistent.

## 2021-05-04 NOTE — MAU Provider Note (Addendum)
History     CSN: [id number]  Arrival date and time: 05/04/21 1518   Event Date/Time   First Provider Initiated Contact with Patient 05/04/21 1714      Chief Complaint  Patient presents with   Abdominal Pain   Rupture of Membranes   HPI  Ms.Brandi [name] is a 25 y.o. female G2P0010 @ [redacted]w[redacted]d here with tightening in her abdomen that started last night around dinner time. She felt back pain this morning. The pain is located in her pelvis and lower back. She is in PT for sciatic pain. She is not wearing a pregnancy support belt, however she is using K tape.  10:00 am she felt watery discharge on her leg. She doesn't know if her water broke, or if it was just discharge. This happened just one time. The discharge was clear. She has had no further discharge.   OB History     Gravida  2   Para      Term      Preterm      AB  1   Living         SAB  1   IAB      Ectopic      Multiple      Live Births              Past Medical History:  Diagnosis Date   Anxiety    Depression    HSV-1 infection    Onychomycosis 12/16/2020   PTSD (post-traumatic stress disorder)    Tinea corporis 01/11/2021   UTI (urinary tract infection)     Past Surgical History:  Procedure Laterality Date   HERNIA REPAIR      Family History  Problem Relation Age of Onset   Heart disease Mother    Stroke Mother    Hypertension Mother    Bipolar disorder Mother    Healthy Father    Anxiety disorder Sister    Depression Sister    Hypertension Sister    Cancer Maternal Grandmother    Hypertension Maternal Grandmother    Cancer Maternal Grandfather    Stroke Maternal Grandfather    Depression Paternal Grandmother    Hypertension Paternal Grandmother     Social History   Tobacco Use   Smoking status: Former    Packs/day: 0.50    Types: Cigarettes    Passive exposure: Never   Smokeless tobacco: Never  Vaping Use   Vaping Use: Never used  Substance Use Topics    Alcohol use: Not Currently    Comment: occasional   Drug use: Never    Allergies: No Known Allergies  Medications Prior to Admission  Medication Sig Dispense Refill Last Dose   Magnesium Oxide (MAG-OXIDE) 200 MG TABS Take 2 tablets (400 mg total) by mouth at bedtime. If that amount causes loose stools in the am, switch to 200mg  daily at bedtime. 60 tablet 3 05/03/2021   polyethylene glycol (MIRALAX) 17 g packet Take 17 g by mouth daily. 30 each 0 Past Month   Prenatal Vit-Fe Fumarate-FA (PRENATAL MULTIVITAMIN) TABS tablet Take 1 tablet by mouth daily at 12 noon.   05/03/2021   Blood Pressure Monitoring (BLOOD PRESSURE MONITOR AUTOMAT) DEVI 1 Device by Does not apply route daily. Automatic blood pressure cuff regular size. To monitor blood pressure regularly at home. ICD-10 code:Z34.90 1 each 0    cyclobenzaprine (FLEXERIL) 10 MG tablet Take 1 tablet (10 mg total) by mouth every 8 (eight) hours as needed  for muscle spasms. 30 tablet 1 More than a month   docusate sodium (COLACE) 100 MG capsule Take 1 capsule (100 mg total) by mouth every 12 (twelve) hours. 60 capsule 2 More than a month   Misc. Devices (GOJJI WEIGHT SCALE) MISC 1 Device by Does not apply route daily as needed. To weight self daily as needed at home. ICD-10 code: Z34.90 1 each 0    ondansetron (ZOFRAN ODT) 4 MG disintegrating tablet Take 1 tablet (4 mg total) by mouth every 8 (eight) hours as needed for up to 4 doses for nausea or vomiting. 20 tablet 0 More than a month   Results for orders placed or performed during the hospital encounter of 05/04/21 (from the past 48 hour(s))  Urinalysis, Routine w reflex microscopic Urine, Clean Catch     Status: Abnormal   Collection Time: 05/04/21  4:30 PM  Result Value Ref Range   Color, Urine YELLOW YELLOW   APPearance CLOUDY (A) CLEAR   Specific Gravity, Urine 1.010 1.005 - 1.030   pH 6.5 5.0 - 8.0   Glucose, UA NEGATIVE NEGATIVE mg/dL   Hgb urine dipstick NEGATIVE NEGATIVE    Bilirubin Urine NEGATIVE NEGATIVE   Ketones, ur NEGATIVE NEGATIVE mg/dL   Protein, ur NEGATIVE NEGATIVE mg/dL   Nitrite NEGATIVE NEGATIVE   Leukocytes,Ua MODERATE (A) NEGATIVE    Comment: Performed at [facility], [address] [place], [place] [address]  Urinalysis, Microscopic (reflex)     Status: Abnormal   Collection Time: 05/04/21  4:30 PM  Result Value Ref Range   RBC / HPF 0-5 0 - 5 RBC/hpf   WBC, UA 21-50 0 - 5 WBC/hpf   Bacteria, UA MANY (A) NONE SEEN   Squamous Epithelial / LPF 11-20 0 - 5   Mucus PRESENT     Comment: Performed at [facility], [address] [place], [place] [address]  Crist Fat Test     Status: None   Collection Time: 05/04/21  6:09 PM  Result Value Ref Range   POCT Fern Test Negative = intact amniotic membranes   Wet prep, genital     Status: Abnormal   Collection Time: 05/04/21  7:46 PM  Result Value Ref Range   Yeast Wet Prep HPF POC NONE SEEN NONE SEEN   Trich, Wet Prep NONE SEEN NONE SEEN   Clue Cells Wet Prep HPF POC NONE SEEN NONE SEEN   WBC, Wet Prep HPF POC MANY (A) NONE SEEN   Sperm NONE SEEN     Comment: Performed at [facility], [address] [place], [place] [address]    Review of Systems  Gastrointestinal:  Positive for abdominal pain.  Genitourinary:  Positive for vaginal discharge. Negative for vaginal bleeding.  Physical Exam   Blood pressure 125/74, pulse 91, temperature 98.4 F (36.9 C), temperature source Oral, resp. rate 18, last menstrual period 10/10/2020, SpO2 99 %.  Physical Exam Vitals and nursing note reviewed.  Constitutional:      General: She is not in acute distress.    Appearance: She is well-developed. She is not ill-appearing, toxic-appearing or diaphoretic.  HENT:     Head: Normocephalic.  Genitourinary:    Comments: Vagina - Large amount of clear fluid pooling out of the vagina. Patient felt fluid pooling out of speculum and vagina.  Cervix - No contact bleeding, no active bleeding   Bimanual exam: Cervix closed, anterior.  Chaperone present for exam.   Skin:  General: Skin is warm.  Neurological:     Mental Status: She is alert and oriented to person, place, and time.   Fetal Tracing: Baseline: 130 bpm Variability: Moderate  Accelerations: 15x15 Decelerations: None  Toco: None  MAU Course  Procedures None  MDM  Amnisure collected by RN, I was at the bedside. Upon insertion the patient immediately complained of pain and burning with insertion of amnisure. The tip of the amnisure showed clear fluid leaking from the end. Amnisure swab was removed and a speculum was insertion. Large fluid pooling in the vagina and out of the vagina. Fern negative US shows AFI 13/15 Dr. Alysia Penna to bedside to examine patient. No pooling of fluid noted Amnisure collected and pending. Report given to Cleone Slim CNM who resumes care of the patient.   Rasch, Harolyn Rutherford, NP  Results for orders placed or performed during the hospital encounter of 05/04/21 (from the past 24 hour(s))  Urinalysis, Routine w reflex microscopic Urine, Clean Catch     Status: Abnormal   Collection Time: 05/04/21  4:30 PM  Result Value Ref Range   Color, Urine YELLOW YELLOW   APPearance CLOUDY (A) CLEAR   Specific Gravity, Urine 1.010 1.005 - 1.030   pH 6.5 5.0 - 8.0   Glucose, UA NEGATIVE NEGATIVE mg/dL   Hgb urine dipstick NEGATIVE NEGATIVE   Bilirubin Urine NEGATIVE NEGATIVE   Ketones, ur NEGATIVE NEGATIVE mg/dL   Protein, ur NEGATIVE NEGATIVE mg/dL   Nitrite NEGATIVE NEGATIVE   Leukocytes,Ua MODERATE (A) NEGATIVE  Urinalysis, Microscopic (reflex)     Status: Abnormal   Collection Time: 05/04/21  4:30 PM  Result Value Ref Range   RBC / HPF 0-5 0 - 5 RBC/hpf   WBC, UA 21-50 0 - 5 WBC/hpf   Bacteria, UA MANY (A) NONE SEEN   Squamous Epithelial / LPF 11-20 0 - 5   Mucus PRESENT   Fern Test     Status: None   Collection Time: 05/04/21  6:09 PM  Result Value Ref Range   POCT Fern Test  Negative = intact amniotic membranes   Amnisure rupture of membrane (rom)not at [facility]     Status: None   Collection Time: 05/04/21  7:46 PM  Result Value Ref Range   Amnisure ROM NEGATIVE   Wet prep, genital     Status: Abnormal   Collection Time: 05/04/21  7:46 PM  Result Value Ref Range   Yeast Wet Prep HPF POC NONE SEEN NONE SEEN   Trich, Wet Prep NONE SEEN NONE SEEN   Clue Cells Wet Prep HPF POC NONE SEEN NONE SEEN   WBC, Wet Prep HPF POC MANY (A) NONE SEEN   Sperm NONE SEEN     Reviewed negative amnisure and patient uncomfortably contracting every 3-5 minutes.  LR bolus- some improvement but still feeling contractions every 10 minutes Procardia x1- patient reports resolution of contractions Cervix rechecked and still closed.   Reviewed with Dr. Alysia Penna- recommends 3 days of prophylactic macrobid and pyridium due to likelihood that swab went into urethra on initial exam. Patient agreeable to plan of care.    Assessment and Plan   1. Encounter for suspected premature rupture of membranes, with rupture of membranes not found   2. [redacted] weeks gestation of pregnancy   3. Preterm contractions    -Discharge home in stable condition -Rx for macrobid and pyridium sent to patient's pharmacy -Preterm labor precautions discussed -Patient advised to follow-up with OB as scheduled for prenatal  care -Patient may return to MAU as needed or if her condition were to change or worsen  Rolm Bookbinder, [place] 05/04/21 10:42 PM

## 2021-05-06 ENCOUNTER — Ambulatory Visit: Payer: Medicaid Other | Attending: Certified Nurse Midwife | Admitting: Physical Therapy

## 2021-05-06 ENCOUNTER — Other Ambulatory Visit: Payer: Self-pay

## 2021-05-06 DIAGNOSIS — R252 Cramp and spasm: Secondary | ICD-10-CM | POA: Insufficient documentation

## 2021-05-06 DIAGNOSIS — R269 Unspecified abnormalities of gait and mobility: Secondary | ICD-10-CM | POA: Insufficient documentation

## 2021-05-06 DIAGNOSIS — M25552 Pain in left hip: Secondary | ICD-10-CM | POA: Insufficient documentation

## 2021-05-06 DIAGNOSIS — R293 Abnormal posture: Secondary | ICD-10-CM | POA: Insufficient documentation

## 2021-05-06 DIAGNOSIS — M6281 Muscle weakness (generalized): Secondary | ICD-10-CM | POA: Insufficient documentation

## 2021-05-06 LAB — CULTURE, OB URINE

## 2021-05-06 NOTE — Therapy (Signed)
[facility] @ [facility] [address] [place], [place], [address] Phone: [phone]   Fax:  [phone]  Physical Therapy Treatment  Patient Details  Name: Brandi [name] MRN: [id number] Date of Birth: [birth year] Referring Provider (PT): Bernerd Limbo, [place]   Encounter Date: 05/06/2021   PT End of Session - 05/06/21 1151     Visit Number 3    Date for PT Re-Evaluation 07/19/21    Authorization Type [employer]    PT Start Time 1104    PT Stop Time 1145    PT Time Calculation (min) 41 min    Activity Tolerance Patient tolerated treatment well;Patient limited by pain    Behavior During Therapy [facility] for tasks assessed/performed             Past Medical History:  Diagnosis Date   Anxiety    Depression    HSV-1 infection    Onychomycosis 12/16/2020   PTSD (post-traumatic stress disorder)    Tinea corporis 01/11/2021   UTI (urinary tract infection)     Past Surgical History:  Procedure Laterality Date   HERNIA REPAIR      There were no vitals filed for this visit.   Subjective Assessment - 05/06/21 1109     Subjective Pt reports recently she has had much more Lt sciatic pain than round ligament pain and would like to focus on this session.    Pertinent History pregnant with first child and h/o miscarriage    How long can you sit comfortably? 30 mins    How long can you stand comfortably? 30 mins    How long can you walk comfortably? 30-45 mins    Diagnostic tests no    Patient Stated Goals to have less pain    Currently in Pain? No/denies                               [facility] Adult PT Treatment/Exercise - 05/06/21 0001       Exercises   Exercises Knee/Hip;Lumbar      Lumbar Exercises: Stretches   Active Hamstring Stretch Right;Left;60 seconds    Piriformis Stretch Right;Left;2 reps;60 seconds    Other Lumbar Stretch Exercise thoracic opening in sidelying x60s each side    Other  Lumbar Stretch Exercise happy baby x45s; deep squat 45s      Lumbar Exercises: Seated   Other Seated Lumbar Exercises hip shifts x10 toes forward/x10 in/x10 out      Lumbar Exercises: Supine   Bridge 10 reps    Bridge Limitations x5 Rt/Lt resisted adduction with red band      Manual Therapy   Manual Therapy Taping    Manual therapy comments K-tape applied at belly for support and decreased strain at pelvis for decreased pain. Pt reports quickly feeling better and able to get on/off of mat table much easier with this.                     PT Education - 05/06/21 1150     Education Details Pt educated on stretching techniques and modifications with pillow or towel placement for comfort and HEP updated    Person(s) Educated Patient    Methods Explanation;Demonstration;Tactile cues;Verbal cues;Handout    Comprehension Verbalized understanding;Returned demonstration              PT Short Term Goals - 04/18/21 1504  PT SHORT TERM GOAL #1   Title Pt to be I with HEP    Time 5    Period Weeks    Status New    Target Date 05/23/21      PT SHORT TERM GOAL #2   Title pt to report no more than 5/10 pain at Lt hip/pelvis with mobility to improve ability to stand longer than 30 mins    Time 5    Period Weeks    Target Date 05/23/21               PT Long Term Goals - 04/18/21 1505       PT LONG TERM GOAL #1   Title Pt to be I with advanced HEP    Time 3    Period Months    Status New    Target Date 07/19/21      PT LONG TERM GOAL #2   Title pt to report no more than 2/10 pain at Lt hip/pelvis for improved tolerance to walking longer than 30 mins    Time 3    Period Months    Status New    Target Date 07/19/21      PT LONG TERM GOAL #3   Title pt to demostrate good lifiting mechanics of 15# to decrease risk of injury with carrying for new baby upon delivery.    Time 3    Period Months    Status New    Target Date 07/19/21      PT LONG TERM  GOAL #4   Title pt to demonstate bil hip strength to 5/5 for birth prep.    Time 3    Period Months    Status New    Target Date 07/19/21                   Plan - 05/06/21 1151     Clinical Impression Statement Pt presents to clinic reporting improved pain with mobility most of the time, does still have more painful days and pain with lifting Lt leg into bed sometimes. Pt reports sciatic pain is more of her pain now compared to round ligament pain though this is still present just not as bad. Pt session focused on stretching at hips and spine to improve pain levels with good effect and included midpelvis opening exercises with internal hip rotation which pt reported great relief with. Pt taped again at end of session and reports this continues to help her pain levels as well. Pt reported feeling better at end of session. Pt would benefit from continued PT to address deficits found at eval    Personal Factors and Comorbidities Comorbidity 1;Fitness    Comorbidities pregnant    Examination-Activity Limitations Locomotion Level;Transfers;Bed Mobility;Lift;Stand;Stairs;Squat    Examination-Participation Restrictions Community Activity;Shop;Yard Work    Stability/Clinical Decision Making Stable/Uncomplicated    Rehab Potential Good    PT Frequency 1x / week    PT Duration Other (comment)   10 visits   PT Treatment/Interventions ADLs/Self Care Home Management;Aquatic Therapy;Neuromuscular re-education;Therapeutic exercise;Therapeutic activities;Functional mobility training;Patient/family education;Taping;Scar mobilization;Passive range of motion;Energy conservation    PT Next Visit Plan hip stretching, manual at hip, anterior pelvic sling strengtheing    PT Home Exercise Plan [facility]    Consulted and Agree with Plan of Care Patient             Patient will benefit from skilled therapeutic intervention in order to improve the following deficits and impairments:  Difficulty  walking,  Pain, Improper body mechanics, Impaired flexibility, Decreased mobility, Decreased strength, Postural dysfunction  Visit Diagnosis: Cramp and spasm  Muscle weakness (generalized)  Abnormal posture     Problem List Patient Active Problem List   Diagnosis Date Noted   Supervision of other normal pregnancy, antepartum 02/14/2021   Otelia Sergeant, PT, DPT 05/06/2210:55 AM   [facility] Out[facility] Re[facility] @ [facility] [address] [place], [place], [address] Phone: [phone]   Fax:  [phone]  Name: Brandi [name] MRN: [id number] Date of Birth: [birth year]

## 2021-05-06 NOTE — Patient Instructions (Signed)
Access Code: [facility] URL: [email or web address] Date: 05/06/2021 Prepared by: [facility] - Outpatient Rehab - [facility]  Exercises Child's Pose Stretch - 1 x daily - 7 x weekly - 1 sets - 1-3 reps - 45s hold Child's Pose with Sidebending - 1 x daily - 7 x weekly - 1 sets - 1-3 reps - 30-45s holds Quadruped Rocking Slow - 1 x daily - 7 x weekly - 1 sets - 1-3 reps - 45s hold Sidelying Open Book Thoracic Lumbar Rotation and Extension - 1 x daily - 7 x weekly - 1 sets - 1-3 reps Deep Squat with Pelvic Floor Relaxation - 1 x daily - 7 x weekly - 1 sets - 1-3 reps - 45s hold Supine Bilateral Hip Internal Rotation Stretch - 1 x daily - 7 x weekly - 1 sets - 10 reps Seated Piriformis Stretch - 1 x daily - 7 x weekly - 1 sets - 1-3 reps - 30-45s holds Pelvic Tilt on Swiss Ball - 1 x daily - 7 x weekly - 1 sets - 10 reps Supine Hamstring Stretch with Strap - 1 x daily - 7 x weekly - 1 sets - 1-3 reps - 30-45s holds

## 2021-05-11 ENCOUNTER — Telehealth (INDEPENDENT_AMBULATORY_CARE_PROVIDER_SITE_OTHER): Payer: Medicaid Other | Admitting: Certified Nurse Midwife

## 2021-05-11 VITALS — Wt 180.0 lb

## 2021-05-11 DIAGNOSIS — Z3493 Encounter for supervision of normal pregnancy, unspecified, third trimester: Secondary | ICD-10-CM

## 2021-05-11 DIAGNOSIS — Z3A30 30 weeks gestation of pregnancy: Secondary | ICD-10-CM

## 2021-05-11 DIAGNOSIS — Z3480 Encounter for supervision of other normal pregnancy, unspecified trimester: Secondary | ICD-10-CM

## 2021-05-13 ENCOUNTER — Encounter: Payer: Medicaid Other | Admitting: Physical Therapy

## 2021-05-13 NOTE — Progress Notes (Signed)
OBSTETRICS PRENATAL VIRTUAL VISIT ENCOUNTER NOTE  Provider location: [facility]   Patient location: Home  I connected with Brandi [name] on 05/13/21 at  1:35 PM EST by MyChart Video Encounter and verified that I am speaking with the correct person using two identifiers. I discussed the limitations, risks, security and privacy concerns of performing an evaluation and management service virtually and the availability of in person appointments. I also discussed with the patient that there may be a patient responsible charge related to this service. The patient expressed understanding and agreed to proceed. Subjective:  Brandi [name] is a 25 y.o. G2P0010 at [redacted]w[redacted]d being seen today for ongoing prenatal care.  She is currently monitored for the following issues for this low-risk pregnancy and has Supervision of other normal pregnancy, antepartum on their problem list.  Patient reports  feeling better with PT and chiro but still having some low back and hip pain. Stretching helps as does the magnesium .  Contractions: Irregular. Vag. Bleeding: None.  Movement: Present. Denies any leaking of fluid.   The following portions of the patient's history were reviewed and updated as appropriate: allergies, current medications, past family history, past medical history, past social history, past surgical history and problem list.   Objective:   Vitals:   05/11/21 1350  Weight: 180 lb (81.6 kg)    Fetal Status:     Movement: Present     General:  Alert, oriented and cooperative. Patient is in no acute distress.  Respiratory: Normal respiratory effort, no problems with respiration noted  Mental Status: Normal mood and affect. Normal behavior. Normal judgment and thought content.  Rest of physical exam deferred due to type of encounter  Imaging: [place] MFM OB LIMITED  Result Date:  05/05/2021 ----------------------------------------------------------------------  OBSTETRICS REPORT                       (Signed Final 05/05/2021 03:34 pm) ---------------------------------------------------------------------- Patient Info  ID #:       [id number]                          D.O.B.:  05-19-1996 (24 yrs)  Name:       Brandi [name]                        Visit Date: 05/04/2021 07:20 pm              Brandi [name] ---------------------------------------------------------------------- Performed By  Attending:        Lin Landsman      Secondary Phy.:   [facility] MAU/Triage                    MD  Performed By:     Sandi Mealy        [facility]  Referred By:      Duane Lope ---------------------------------------------------------------------- Orders  #  Description  Code        Ordered By  1  [place] MFM OB LIMITED                     [id number]    JENNIFER [facility] ----------------------------------------------------------------------  #  Order #                     Accession #                Episode #  1  [id number]                   [id number]                 [id number] ---------------------------------------------------------------------- Indications  [redacted] weeks gestation of pregnancy                Z3A.29  Premature rupture of membranes - leaking       O42.90  fluid ---------------------------------------------------------------------- Fetal Evaluation  Num Of Fetuses:         1  Fetal Heart Rate(bpm):  135  Cardiac Activity:       Observed  Presentation:           Cephalic  Placenta:               Anterior  P. Cord Insertion:      Not well visualized  Amniotic Fluid  AFI FV:      Within normal limits  AFI Sum(cm)     %Tile       Largest Pocket(cm)  13.8            44          4.2  RUQ(cm)       RLQ(cm)       LUQ(cm)        LLQ(cm)  2.6           3.3           3.7            4.2   Comment:    No placental abruption or previa identified. ---------------------------------------------------------------------- Biometry  LV:        4.1  mm ---------------------------------------------------------------------- OB History  Gravidity:    2         Term:   0        Prem:   0        SAB:   1  TOP:          0       Ectopic:  0        Living: 0 ---------------------------------------------------------------------- Gestational Age  Clinical EDD:  29w 2d                                        EDD:   07/18/21  Best:          29w 2d     Det. By:  Clinical EDD             EDD:   07/18/21 ---------------------------------------------------------------------- Anatomy  Cranium:               Appears normal         Stomach:                Appears normal, left  sided  Ventricles:            Appears normal         Kidneys:                Appear normal  Thoracic:              Appears normal ---------------------------------------------------------------------- Impression  Limited exam to rule out PPROM  Good amniotic fluid volume  Cephalic presentation  Good fetal movement.  Amnisure and exam was negative. ---------------------------------------------------------------------- Recommendations  Follow up as clinically indicated. ----------------------------------------------------------------------               Lin Landsman, MD Electronically Signed Final Report   05/05/2021 03:34 pm ----------------------------------------------------------------------   Assessment and Plan:  Pregnancy: G2P0010 at [redacted]w[redacted]d 1. Supervision of low-risk pregnancy, third trimester - Doing well, feeling regular and vigorous fetal movement   2. [redacted] weeks gestation of pregnancy - Routine OB care  - Gave encouragement that the third trimester often requires a good routine of self-care and stretching twice daily to preserve comfort and mobility. Encouraged to try  flexeril at night and keep up the PT/chiro/daily stretching for comfort.  Preterm labor symptoms and general obstetric precautions including but not limited to vaginal bleeding, contractions, leaking of fluid and fetal movement were reviewed in detail with the patient. I discussed the assessment and treatment plan with the patient. The patient was provided an opportunity to ask questions and all were answered. The patient agreed with the plan and demonstrated an understanding of the instructions. The patient was advised to call back or seek an in-person office evaluation/go to MAU at [facility] for any urgent or concerning symptoms. Please refer to After Visit Summary for other counseling recommendations.   I provided 15 minutes of face-to-face time during this encounter.  No follow-ups on file.  Future Appointments  Date Time Provider Department Center  05/20/2021 10:15 AM Barbaraann Faster, PT [facility] None  05/23/2021 11:00 AM Barbaraann Faster, PT [facility] None  05/25/2021 10:35 AM Leftwich-Kirby, Wilmer Floor, CNM [facility] None  05/30/2021 12:30 PM Barbaraann Faster, PT [facility] None  06/06/2021 12:30 PM Barbaraann Faster, PT [facility] None  06/13/2021 11:45 AM Barbaraann Faster, PT [facility] None  06/20/2021 11:45 AM Barbaraann Faster, PT [facility] None  07/08/2021 11:00 AM Barbaraann Faster, PT [facility] None    Bernerd Limbo, CNM [facility] [name]

## 2021-05-20 ENCOUNTER — Ambulatory Visit: Payer: Medicaid Other | Admitting: Physical Therapy

## 2021-05-20 ENCOUNTER — Other Ambulatory Visit: Payer: Self-pay

## 2021-05-20 DIAGNOSIS — R269 Unspecified abnormalities of gait and mobility: Secondary | ICD-10-CM

## 2021-05-20 DIAGNOSIS — R252 Cramp and spasm: Secondary | ICD-10-CM | POA: Diagnosis not present

## 2021-05-20 DIAGNOSIS — M6281 Muscle weakness (generalized): Secondary | ICD-10-CM

## 2021-05-20 NOTE — Therapy (Signed)
[facility] [address] [place], [place], [address] Phone: [phone]   Fax:  [phone]  Physical Therapy Treatment  Patient Details  Name: Brandi [name] MRN: [id number] Date of Birth: [birth year] Referring Provider (PT): Bernerd Limbo, [place]   Encounter Date: 05/20/2021   PT End of Session - 05/20/21 1053     Visit Number 4    Date for PT Re-Evaluation 07/19/21    Authorization Type [employer]    PT Start Time 1015    PT Stop Time 1055    PT Time Calculation (min) 40 min    Activity Tolerance Patient tolerated treatment well;Patient limited by pain    Behavior During Therapy [facility] for tasks assessed/performed             Past Medical History:  Diagnosis Date   Anxiety    Depression    HSV-1 infection    Onychomycosis 12/16/2020   PTSD (post-traumatic stress disorder)    Tinea corporis 01/11/2021   UTI (urinary tract infection)     Past Surgical History:  Procedure Laterality Date   HERNIA REPAIR      There were no vitals filed for this visit.   Subjective Assessment - 05/20/21 1019     Subjective Pt reports pain continues at night more than anything with side lying, needs to switch sides frequently due to pain. Pt hasn't had pain with transferring into standing now which has improved, round ligament pain at Lt side continues constantly.    Pertinent History pregnant with first child and h/o miscarriage    How long can you sit comfortably? 45 mins    How long can you stand comfortably? 45 mins    How long can you walk comfortably? 30-45 mins    Diagnostic tests no    Patient Stated Goals to have less pain    Currently in Pain? Yes    Pain Score 5     Pain Location Hip    Pain Orientation Left    Pain Descriptors / Indicators Sharp;Tightness                               [facility] Adult PT Treatment/Exercise - 05/20/21 0001       Self-Care   Self-Care Other  Self-Care Comments    Other Self-Care Comments  educated on belly band application, good relief with use in clinic      Exercises   Exercises Knee/Hip;Lumbar      Lumbar Exercises: Stretches   Piriformis Stretch Right;Left;2 reps;60 seconds    Other Lumbar Stretch Exercise standing IR at hip with elevated foot x10 each side, deep squat x30s    Other Lumbar Stretch Exercise puppy pose 2x45s      Lumbar Exercises: Seated   Other Seated Lumbar Exercises hip shifts with ball between x10 toes forward/x10 in/x10 out                     PT Education - 05/20/21 1053     Education Details Pt educated on belly band use    Person(s) Educated Patient    Methods Explanation;Demonstration;Tactile cues;Verbal cues    Comprehension Returned demonstration;Verbalized understanding              PT Short Term Goals - 04/18/21 1504       PT SHORT TERM GOAL #1   Title Pt to be I with  HEP    Time 5    Period Weeks    Status New    Target Date 05/23/21      PT SHORT TERM GOAL #2   Title pt to report no more than 5/10 pain at Lt hip/pelvis with mobility to improve ability to stand longer than 30 mins    Time 5    Period Weeks    Target Date 05/23/21               PT Long Term Goals - 04/18/21 1505       PT LONG TERM GOAL #1   Title Pt to be I with advanced HEP    Time 3    Period Months    Status New    Target Date 07/19/21      PT LONG TERM GOAL #2   Title pt to report no more than 2/10 pain at Lt hip/pelvis for improved tolerance to walking longer than 30 mins    Time 3    Period Months    Status New    Target Date 07/19/21      PT LONG TERM GOAL #3   Title pt to demostrate good lifiting mechanics of 15# to decrease risk of injury with carrying for new baby upon delivery.    Time 3    Period Months    Status New    Target Date 07/19/21      PT LONG TERM GOAL #4   Title pt to demonstate bil hip strength to 5/5 for birth prep.    Time 3    Period  Months    Status New    Target Date 07/19/21                   Plan - 05/20/21 1054     Clinical Impression Statement Pt presents to clinic reporting some improvement with hip/round ligament pain but still continues. After speaking to pt more about pain, she thinks this is mostly round ligament pain and with stretches during session and placement of belly band in clinic pt reported greatly relieved symptoms. Pt tolerated session well with much less pain and reports she is going to try to find a belly band today to help with pain as her as home isn't made the way clinic sample is and pt liked that one better. Pt continues to demonstrate need of PT to improved pain levels and birth prep.    Personal Factors and Comorbidities Comorbidity 1;Fitness    Comorbidities pregnant    Examination-Activity Limitations Locomotion Level;Transfers;Bed Mobility;Lift;Stand;Stairs;Squat    Examination-Participation Restrictions Community Activity;Shop;Yard Work    Stability/Clinical Decision Making Stable/Uncomplicated    Rehab Potential Good    PT Frequency 1x / week    PT Duration Other (comment)   10 visits   PT Treatment/Interventions ADLs/Self Care Home Management;Aquatic Therapy;Neuromuscular re-education;Therapeutic exercise;Therapeutic activities;Functional mobility training;Patient/family education;Taping;Scar mobilization;Passive range of motion;Energy conservation    PT Next Visit Plan hip stretching, manual at hip, anterior pelvic sling strengtheing    PT Home Exercise Plan [facility]    Consulted and Agree with Plan of Care Patient             Patient will benefit from skilled therapeutic intervention in order to improve the following deficits and impairments:  Difficulty walking, Pain, Improper body mechanics, Impaired flexibility, Decreased mobility, Decreased strength, Postural dysfunction  Visit Diagnosis: Cramp and spasm  Muscle weakness (generalized)  Abnormality of gait and  mobility  Problem List Patient Active Problem List   Diagnosis Date Noted   Supervision of other normal pregnancy, antepartum 02/14/2021    Brandi [name], PT, DPT 05/20/2209:58 AM   [facility] @ [facility] [address] [place], [place], [address] Phone: [phone]   Fax:  [phone]  Name: Kiyara Bouffard MRN: [id number] Date of Birth: [birth year]

## 2021-05-23 ENCOUNTER — Other Ambulatory Visit: Payer: Self-pay

## 2021-05-23 ENCOUNTER — Ambulatory Visit: Payer: Medicaid Other | Admitting: Physical Therapy

## 2021-05-23 ENCOUNTER — Encounter: Payer: Self-pay | Admitting: Physical Therapy

## 2021-05-23 DIAGNOSIS — M25552 Pain in left hip: Secondary | ICD-10-CM

## 2021-05-23 DIAGNOSIS — R252 Cramp and spasm: Secondary | ICD-10-CM | POA: Diagnosis not present

## 2021-05-23 DIAGNOSIS — M6281 Muscle weakness (generalized): Secondary | ICD-10-CM

## 2021-05-23 DIAGNOSIS — R293 Abnormal posture: Secondary | ICD-10-CM

## 2021-05-23 NOTE — Therapy (Signed)
[facility] [address] [place], [place], [address] Phone: [phone]   Fax:  [phone]  Physical Therapy Treatment  Patient Details  Name: Brandi [name] MRN: [id number] Date of Birth: Aug 04, 1995 Referring Provider (PT): Bernerd Limbo, [place]   Encounter Date: 05/23/2021   PT End of Session - 05/23/21 1142     Visit Number 5    Date for PT Re-Evaluation 07/19/21    Authorization Type [employer]    PT Start Time 1108   pt's arrival time   PT Stop Time 1144    PT Time Calculation (min) 36 min    Activity Tolerance Patient tolerated treatment well;Patient limited by pain    Behavior During Therapy [facility] for tasks assessed/performed             Past Medical History:  Diagnosis Date   Anxiety    Depression    HSV-1 infection    Onychomycosis 12/16/2020   PTSD (post-traumatic stress disorder)    Tinea corporis 01/11/2021   UTI (urinary tract infection)     Past Surgical History:  Procedure Laterality Date   HERNIA REPAIR      There were no vitals filed for this visit.   Subjective Assessment - 05/23/21 1111     Subjective Pt reports she forgot about belly band but did use hers from previously and noticed less pain with walking and able to walk longer distance with less pain. Pt reports she has less pain than usual at Lt hip.    Pertinent History pregnant with first child and h/o miscarriage    How long can you sit comfortably? 45 mins    How long can you stand comfortably? 45 mins    How long can you walk comfortably? 30-45 mins    Diagnostic tests no    Patient Stated Goals to have less pain    Currently in Pain? Yes    Pain Score 3     Pain Location Hip    Pain Orientation Left;Posterior    Pain Descriptors / Indicators Tightness    Pain Type Acute pain                               [facility] Adult PT Treatment/Exercise - 05/23/21 0001       Exercises   Exercises  Lumbar;Knee/Hip      Lumbar Exercises: Stretches   Piriformis Stretch Right;Left;60 seconds;3 reps    Piriformis Stretch Limitations final rep with release ball at piriformis      Lumbar Exercises: Seated   Other Seated Lumbar Exercises pelvic tilts ant/post, Lt/Rt, figure 8s all x10; x10 hip shifts and x10 hip shift with toes in      Lumbar Exercises: Sidelying   Clam Limitations reverse clams x10 each side no resistance    Other Sidelying Lumbar Exercises foam roller: mid pelvic opening x10 and one 30s hold; foam roller at ankle for IR stretch in side lying x60s each side                       PT Short Term Goals - 04/18/21 1504       PT SHORT TERM GOAL #1   Title Pt to be I with HEP    Time 5    Period Weeks    Status New    Target Date 05/23/21      PT  SHORT TERM GOAL #2   Title pt to report no more than 5/10 pain at Lt hip/pelvis with mobility to improve ability to stand longer than 30 mins    Time 5    Period Weeks    Target Date 05/23/21               PT Long Term Goals - 04/18/21 1505       PT LONG TERM GOAL #1   Title Pt to be I with advanced HEP    Time 3    Period Months    Status New    Target Date 07/19/21      PT LONG TERM GOAL #2   Title pt to report no more than 2/10 pain at Lt hip/pelvis for improved tolerance to walking longer than 30 mins    Time 3    Period Months    Status New    Target Date 07/19/21      PT LONG TERM GOAL #3   Title pt to demostrate good lifiting mechanics of 15# to decrease risk of injury with carrying for new baby upon delivery.    Time 3    Period Months    Status New    Target Date 07/19/21      PT LONG TERM GOAL #4   Title pt to demonstate bil hip strength to 5/5 for birth prep.    Time 3    Period Months    Status New    Target Date 07/19/21                   Plan - 05/23/21 1143     Clinical Impression Statement Pt presents to clinic reporting improvement in pain overall but  still has ~3/10 somtimes up to 5/10 but not constantly. Pt session focused on hip moblity at upper/mid/lower pelvis for birth prep and improve pain levels with good effect pt reporting 3/10 pain at start of session and 1/10 pain at end in Lt hip. Pt tolerated well and reported improved understanding of labor progressions. Pt continues to demonstrate need of PT to improved pain levels and birth prep.    Personal Factors and Comorbidities Comorbidity 1;Fitness    Comorbidities pregnant    Examination-Activity Limitations Locomotion Level;Transfers;Bed Mobility;Lift;Stand;Stairs;Squat    Examination-Participation Restrictions Community Activity;Shop;Yard Work    Stability/Clinical Decision Making Stable/Uncomplicated    Rehab Potential Good    PT Frequency 1x / week    PT Duration Other (comment)   10 visits   PT Treatment/Interventions ADLs/Self Care Home Management;Aquatic Therapy;Neuromuscular re-education;Therapeutic exercise;Therapeutic activities;Functional mobility training;Patient/family education;Taping;Scar mobilization;Passive range of motion;Energy conservation    PT Next Visit Plan hip stretching, manual at hip, anterior pelvic sling strengtheing    PT Home Exercise Plan [facility]    Consulted and Agree with Plan of Care Patient             Patient will benefit from skilled therapeutic intervention in order to improve the following deficits and impairments:  Difficulty walking, Pain, Improper body mechanics, Impaired flexibility, Decreased mobility, Decreased strength, Postural dysfunction  Visit Diagnosis: Pain in left hip  Muscle weakness (generalized)  Abnormal posture     Problem List Patient Active Problem List   Diagnosis Date Noted   Supervision of other normal pregnancy, antepartum 02/14/2021    Otelia Sergeant, PT, DPT 05/23/2210:46 AM   [facility] @ [facility] [address] [place], [place], [address] Phone:  [phone]  Fax:  [phone]  Name: Brandi [name] MRN: [id number] Date of Birth: 1996/03/25

## 2021-05-25 ENCOUNTER — Telehealth (INDEPENDENT_AMBULATORY_CARE_PROVIDER_SITE_OTHER): Payer: Medicaid Other | Admitting: Advanced Practice Midwife

## 2021-05-25 VITALS — Wt 183.9 lb

## 2021-05-25 DIAGNOSIS — O26893 Other specified pregnancy related conditions, third trimester: Secondary | ICD-10-CM

## 2021-05-25 DIAGNOSIS — R109 Unspecified abdominal pain: Secondary | ICD-10-CM

## 2021-05-25 DIAGNOSIS — Z3A32 32 weeks gestation of pregnancy: Secondary | ICD-10-CM

## 2021-05-25 DIAGNOSIS — Z3480 Encounter for supervision of other normal pregnancy, unspecified trimester: Secondary | ICD-10-CM

## 2021-05-25 NOTE — Progress Notes (Signed)
OBSTETRICS PRENATAL VIRTUAL VISIT ENCOUNTER NOTE  Provider location: [facility]   Patient location: Home  I connected with [name] on 05/25/21 at 10:35 AM EST by MyChart Video Encounter and verified that I am speaking with the correct person using two identifiers. I discussed the limitations, risks, security and privacy concerns of performing an evaluation and management service virtually and the availability of in person appointments. I also discussed with the patient that there may be a patient responsible charge related to this service. The patient expressed understanding and agreed to proceed. Subjective:  Brandi [name] is a 25 y.o. G2P0010 at [redacted]w[redacted]d being seen today for ongoing prenatal care.  She is currently monitored for the following issues for this low-risk pregnancy and has Supervision of other normal pregnancy, antepartum on their problem list.  Patient reports occasional contractions.  Contractions: Irregular. Vag. Bleeding: None.  Movement: Present. Denies any leaking of fluid.   The following portions of the patient's history were reviewed and updated as appropriate: allergies, current medications, past family history, past medical history, past social history, past surgical history and problem list.   Objective:   Vitals:   05/25/21 1035  Weight: 183 lb 14.4 oz (83.4 kg)    Fetal Status:     Movement: Present     General:  Alert, oriented and cooperative. Patient is in no acute distress.  Respiratory: Normal respiratory effort, no problems with respiration noted  Mental Status: Normal mood and affect. Normal behavior. Normal judgment and thought content.  Rest of physical exam deferred due to type of encounter  Imaging: [place] MFM OB LIMITED  Result Date: 05/05/2021 ----------------------------------------------------------------------  OBSTETRICS REPORT                       (Signed Final 05/05/2021 03:34 pm)  ---------------------------------------------------------------------- Patient Info  ID #:       [id number]                          D.O.B.:  1995/07/09 (24 yrs)  Name:       Brandi [name]                        Visit Date: 05/04/2021 07:20 pm              [place] ---------------------------------------------------------------------- Performed By  Attending:        Lin Landsman      Secondary Phy.:   [facility] MAU/Triage                    MD  Performed By:     Sandi Mealy        [facility]  Referred By:      Duane Lope ---------------------------------------------------------------------- Orders  #  Description  Code        Ordered By  1  [place] MFM OB LIMITED                     [id number]    [name] ----------------------------------------------------------------------  #  Order #                     Accession #                Episode #  1  [id number]                   [id number]                 [id number] ---------------------------------------------------------------------- Indications  [redacted] weeks gestation of pregnancy                Z3A.29  Premature rupture of membranes - leaking       O42.90  fluid ---------------------------------------------------------------------- Fetal Evaluation  Num Of Fetuses:         1  Fetal Heart Rate(bpm):  135  Cardiac Activity:       Observed  Presentation:           Cephalic  Placenta:               Anterior  P. Cord Insertion:      Not well visualized  Amniotic Fluid  AFI FV:      Within normal limits  AFI Sum(cm)     %Tile       Largest Pocket(cm)  13.8            44          4.2  RUQ(cm)       RLQ(cm)       LUQ(cm)        LLQ(cm)  2.6           3.3           3.7            4.2  Comment:    No placental abruption or previa identified. ---------------------------------------------------------------------- Biometry  LV:        4.1  mm  ---------------------------------------------------------------------- OB History  Gravidity:    2         Term:   0        Prem:   0        SAB:   1  TOP:          0       Ectopic:  0        Living: 0 ---------------------------------------------------------------------- Gestational Age  Clinical EDD:  29w 2d                                        EDD:   07/18/21  Best:          29w 2d     Det. By:  Clinical EDD             EDD:   07/18/21 ---------------------------------------------------------------------- Anatomy  Cranium:               Appears normal         Stomach:                Appears normal, left  sided  Ventricles:            Appears normal         Kidneys:                Appear normal  Thoracic:              Appears normal ---------------------------------------------------------------------- Impression  Limited exam to rule out PPROM  Good amniotic fluid volume  Cephalic presentation  Good fetal movement.  Amnisure and exam was negative. ---------------------------------------------------------------------- Recommendations  Follow up as clinically indicated. ----------------------------------------------------------------------               Lin Landsman, MD Electronically Signed Final Report   05/05/2021 03:34 pm ----------------------------------------------------------------------   Assessment and Plan:  Pregnancy: G2P0010 at [redacted]w[redacted]d 1. Supervision of other normal pregnancy, antepartum --Pt reports good fetal movement, denies cramping, LOF, or vaginal bleeding --No BP cuff at home, pt without hx HTN, no s/sx of PEC --BP at next visit --Waterbirth consent at next visit, no barriers at this time --Anticipatory guidance about next visits/weeks of pregnancy given.   2. [redacted] weeks gestation of pregnancy   3. Abdominal pain in pregnancy, third trimester --Pt with single episode of pain in upper abdomen, at fundus that  felt like soreness. No pain currently.  No intermittent pain or tightening.  May be fetal position.  No bleeding or leaking fluid.  Good fetal movement.   --No s/sx of PTL but precautions given, reasons to seek care and where to go  Preterm labor symptoms and general obstetric precautions including but not limited to vaginal bleeding, contractions, leaking of fluid and fetal movement were reviewed in detail with the patient. I discussed the assessment and treatment plan with the patient. The patient was provided an opportunity to ask questions and all were answered. The patient agreed with the plan and demonstrated an understanding of the instructions. The patient was advised to call back or seek an in-person office evaluation/go to MAU at [name] for any urgent or concerning symptoms. Please refer to After Visit Summary for other counseling recommendations.   I provided 10 minutes of face-to-face time during this encounter.  Return in about 2 weeks (around 06/08/2021).  Future Appointments  Date Time Provider Department Center  05/30/2021 12:30 PM Barbaraann Faster, PT [facility] None  06/06/2021 12:30 PM Barbaraann Faster, PT [facility] None  06/13/2021 11:45 AM Barbaraann Faster, PT [facility] None  06/20/2021 11:45 AM Barbaraann Faster, PT [facility] None  07/08/2021 11:00 AM Barbaraann Faster, PT [facility] None    [name], CNM [facility] [employer]

## 2021-05-30 ENCOUNTER — Ambulatory Visit: Payer: Medicaid Other | Admitting: Physical Therapy

## 2021-05-30 ENCOUNTER — Other Ambulatory Visit: Payer: Self-pay

## 2021-05-30 DIAGNOSIS — M6281 Muscle weakness (generalized): Secondary | ICD-10-CM

## 2021-05-30 DIAGNOSIS — R293 Abnormal posture: Secondary | ICD-10-CM

## 2021-05-30 DIAGNOSIS — R252 Cramp and spasm: Secondary | ICD-10-CM

## 2021-05-30 NOTE — Therapy (Addendum)
[facility] @ [address] [place], [place], [address] Phone: [phone]   Fax:  [phone]  Physical Therapy Treatment  Patient Details  Name: Brandi [name] MRN: [id number] Date of Birth: 1996/02/01 Referring Provider (PT): Gabriel Carina, [place]   Encounter Date: 05/30/2021   PT End of Session - 05/30/21 1310     Visit Number 6    Date for PT Re-Evaluation 07/19/21    Authorization Type [employer]    PT Start Time 1231    PT Stop Time 1310    PT Time Calculation (min) 39 min    Activity Tolerance Patient tolerated treatment well;Patient limited by pain    Behavior During Therapy [facility] for tasks assessed/performed             Past Medical History:  Diagnosis Date   Anxiety    Depression    HSV-1 infection    Onychomycosis 12/16/2020   PTSD (post-traumatic stress disorder)    Tinea corporis 01/11/2021   UTI (urinary tract infection)     Past Surgical History:  Procedure Laterality Date   HERNIA REPAIR      There were no vitals filed for this visit.   Subjective Assessment - 05/30/21 1236     Subjective Pt reports she has purchased belly band and will use today for the first time to see if this helps pain. Pt reported 4/10 pain Lt hip and Rt hip but right side pain not the same as left. But has been sitting more with traveling and laying.    Pertinent History pregnant with first child and h/o miscarriage    How long can you sit comfortably? 45 mins    How long can you stand comfortably? 45 mins    How long can you walk comfortably? 30-45 mins    Patient Stated Goals to have less pain    Currently in Pain? Yes    Pain Score 4     Pain Location Hip    Pain Orientation Left;Right    Pain Descriptors / Indicators Tightness    Pain Type Acute pain                               [facility] Adult PT Treatment/Exercise - 05/30/21 0001       Exercises   Exercises Lumbar;Knee/Hip       Lumbar Exercises: Stretches   Piriformis Stretch Right;Left;60 seconds;2 reps    Piriformis Stretch Limitations final rep with release ball at piriformis    Other Lumbar Stretch Exercise standing IR at hip with elevated foot x10 each side and one 30s hold      Lumbar Exercises: Seated   Other Seated Lumbar Exercises pelvic tilts ant/post, Lt/Rt, figure 8s all x10; x10 hip shifts and x10 hip shift with toes in while sitting on ball      Lumbar Exercises: Sidelying   Clam Limitations reverse clams x10 each side yellow loop    Other Sidelying Lumbar Exercises foam roller: mid pelvic opening x10 and one 30s hold; foam roller at ankle for IR stretch in side lying x60s each side                     PT Education - 05/30/21 1310     Education Details Pt educated on continued HEP and belly band use    Person(s) Educated Patient    Methods Explanation;Demonstration;Tactile cues;Verbal  cues    Comprehension Returned demonstration;Verbalized understanding              PT Short Term Goals - 04/18/21 1504       PT SHORT TERM GOAL #1   Title Pt to be I with HEP    Time 5    Period Weeks    Status New    Target Date 05/23/21      PT SHORT TERM GOAL #2   Title pt to report no more than 5/10 pain at Lt hip/pelvis with mobility to improve ability to stand longer than 30 mins    Time 5    Period Weeks    Target Date 05/23/21               PT Long Term Goals - 04/18/21 1505       PT LONG TERM GOAL #1   Title Pt to be I with advanced HEP    Time 3    Period Months    Status New    Target Date 07/19/21      PT LONG TERM GOAL #2   Title pt to report no more than 2/10 pain at [place] for improved tolerance to walking longer than 30 mins    Time 3    Period Months    Status New    Target Date 07/19/21      PT LONG TERM GOAL #3   Title pt to demostrate good lifiting mechanics of 15# to decrease risk of injury with carrying for new baby upon delivery.     Time 3    Period Months    Status New    Target Date 07/19/21      PT LONG TERM GOAL #4   Title pt to demonstate bil hip strength to 5/5 for birth prep.    Time 3    Period Months    Status New    Target Date 07/19/21                   Plan - 05/30/21 1310     Clinical Impression Statement Pt presents to clinic reporting some relief over the past week but with traveling for the holiday spent more time in the car sitting and thinks this made her pain worse in both hips today. Pt bought her belly band yesterday and hasn't had a chance to wear it yet but is going to today. Pt reported improvement with pain at end of session and session focused on hip and spinal mobility to decrease strain at hips. Pt tolerated well and improved mobility compared to previous sessions.  Pt continues to demonstrate need of PT to improved pain levels and birth prep.    Personal Factors and Comorbidities Comorbidity 1;Fitness    Comorbidities pregnant    Examination-Activity Limitations Locomotion Level;Transfers;Bed Mobility;Lift;Stand;Stairs;Squat    Examination-Participation Restrictions Community Activity;Shop;Yard Work    Stability/Clinical Decision Making Stable/Uncomplicated    Rehab Potential Good    PT Frequency 1x / week    PT Duration Other (comment)   10 visits   PT Treatment/Interventions ADLs/Self Care Home Management;Aquatic Therapy;Neuromuscular re-education;Therapeutic exercise;Therapeutic activities;Functional mobility training;Patient/family education;Taping;Scar mobilization;Passive range of motion;Energy conservation    PT Next Visit Plan hip stretching, manual at hip, anterior pelvic sling strengtheing    PT Home Exercise Plan [place] and Agree with Plan of Care Patient             Patient will benefit  from skilled therapeutic intervention in order to improve the following deficits and impairments:  Difficulty walking, Pain, Improper body mechanics, Impaired  flexibility, Decreased mobility, Decreased strength, Postural dysfunction  Visit Diagnosis: Cramp and spasm  Muscle weakness (generalized)  Abnormal posture     Problem List Patient Active Problem List   Diagnosis Date Noted   Supervision of other normal pregnancy, antepartum 02/14/2021   Stacy Gardner, PT, DPT 11/28/221:14 PM    PHYSICAL THERAPY DISCHARGE SUMMARY  Visits from Start of Care: 6  Current functional level related to goals / functional outcomes: Pt called clinic to request to be Dc'd this date due to increased difficulty making appointments.    Remaining deficits: Unable to formally reassess as pt has not returned since last appointment and called to be Dc'd.    Education / Equipment: HEP   Patient agrees to discharge. Patient goals were partially met. Patient is being discharged due to not returning since the last visit. Thank you for referral.   Stacy Gardner, PT, DPT 12/12/222:24 PM     [facility] @ [address] [place], [place], [address] Phone: [phone]   Fax:  [phone]  Name: Brandi [name] MRN: [id number] Date of Birth: 12/15/1995

## 2021-06-06 ENCOUNTER — Encounter: Payer: Medicaid Other | Admitting: Physical Therapy

## 2021-06-08 ENCOUNTER — Other Ambulatory Visit: Payer: Self-pay

## 2021-06-08 ENCOUNTER — Inpatient Hospital Stay (HOSPITAL_COMMUNITY)
Admission: AD | Admit: 2021-06-08 | Discharge: 2021-06-08 | Disposition: A | Discharge status: Home / Self Care | Payer: Medicaid Other | Attending: Obstetrics & Gynecology | Admitting: Obstetrics & Gynecology

## 2021-06-08 ENCOUNTER — Encounter (HOSPITAL_COMMUNITY): Payer: Self-pay | Admitting: Obstetrics & Gynecology

## 2021-06-08 DIAGNOSIS — Z3689 Encounter for other specified antenatal screening: Secondary | ICD-10-CM

## 2021-06-08 DIAGNOSIS — Z3A34 34 weeks gestation of pregnancy: Secondary | ICD-10-CM | POA: Diagnosis not present

## 2021-06-08 DIAGNOSIS — Z3493 Encounter for supervision of normal pregnancy, unspecified, third trimester: Secondary | ICD-10-CM

## 2021-06-08 DIAGNOSIS — O368130 Decreased fetal movements, third trimester, not applicable or unspecified: Secondary | ICD-10-CM | POA: Diagnosis not present

## 2021-06-08 LAB — URINALYSIS, ROUTINE W REFLEX MICROSCOPIC
Bilirubin Urine: NEGATIVE
Glucose, UA: NEGATIVE mg/dL
Hgb urine dipstick: NEGATIVE
Ketones, ur: NEGATIVE mg/dL
Nitrite: NEGATIVE
Protein, ur: NEGATIVE mg/dL
Specific Gravity, Urine: 1.015 (ref 1.005–1.030)
pH: 6.5 (ref 5.0–8.0)

## 2021-06-08 LAB — URINALYSIS, MICROSCOPIC (REFLEX)

## 2021-06-08 NOTE — MAU Provider Note (Signed)
History     CSN: [id number]  Arrival date and time: 06/08/21 0906   Event Date/Time   First Provider Initiated Contact with Patient 06/08/21 [phone]      Chief Complaint  Patient presents with   Decreased Fetal Movement   HPI Brandi [name] is a 25 y.o. G2P0010 at [redacted]w[redacted]d who presents with decreased fetal movement for the last 2 hours. She states she has been feeling the baby move differently the last 2 days but this morning she didn't feel any movement. Since her arrival to MAU, she is feeling normal movement. She denies any pain or bleeding or leaking of fluid.  OB History     Gravida  2   Para      Term      Preterm      AB  1   Living         SAB  1   IAB      Ectopic      Multiple      Live Births              Past Medical History:  Diagnosis Date   Anxiety    Depression    HSV-1 infection    Onychomycosis 12/16/2020   PTSD (post-traumatic stress disorder)    Tinea corporis 01/11/2021   UTI (urinary tract infection)     Past Surgical History:  Procedure Laterality Date   HERNIA REPAIR      Family History  Problem Relation Age of Onset   Heart disease Mother    Stroke Mother    Hypertension Mother    Bipolar disorder Mother    Healthy Father    Anxiety disorder Sister    Depression Sister    Hypertension Sister    Cancer Maternal Grandmother    Hypertension Maternal Grandmother    Cancer Maternal Grandfather    Stroke Maternal Grandfather    Depression Paternal Grandmother    Hypertension Paternal Grandmother     Social History   Tobacco Use   Smoking status: Former    Packs/day: 0.50    Types: Cigarettes    Passive exposure: Never   Smokeless tobacco: Never  Vaping Use   Vaping Use: Never used  Substance Use Topics   Alcohol use: Not Currently    Comment: occasional   Drug use: Never    Allergies: No Known Allergies  Medications Prior to Admission  Medication Sig Dispense Refill Last Dose   Prenatal Vit-Fe  Fumarate-FA (PRENATAL MULTIVITAMIN) TABS tablet Take 1 tablet by mouth daily at 12 noon.   06/07/2021   Blood Pressure Monitoring (BLOOD PRESSURE MONITOR AUTOMAT) DEVI 1 Device by Does not apply route daily. Automatic blood pressure cuff regular size. To monitor blood pressure regularly at home. ICD-10 code:Z34.90 (Patient not taking: Reported on 05/11/2021) 1 each 0    cyclobenzaprine (FLEXERIL) 10 MG tablet Take 1 tablet (10 mg total) by mouth every 8 (eight) hours as needed for muscle spasms. 30 tablet 1    docusate sodium (COLACE) 100 MG capsule Take 1 capsule (100 mg total) by mouth every 12 (twelve) hours. 60 capsule 2    Magnesium Oxide (MAG-OXIDE) 200 MG TABS Take 2 tablets (400 mg total) by mouth at bedtime. If that amount causes loose stools in the am, switch to 200mg  daily at bedtime. 60 tablet 3    Misc. Devices (GOJJI WEIGHT SCALE) MISC 1 Device by Does not apply route daily as needed. To weight self daily as needed  at home. ICD-10 code: Z34.90 1 each 0    nitrofurantoin, macrocrystal-monohydrate, (MACROBID) 100 MG capsule Take 1 capsule (100 mg total) by mouth 2 (two) times daily. 6 capsule 0    ondansetron (ZOFRAN ODT) 4 MG disintegrating tablet Take 1 tablet (4 mg total) by mouth every 8 (eight) hours as needed for up to 4 doses for nausea or vomiting. 20 tablet 0    polyethylene glycol (MIRALAX) 17 g packet Take 17 g by mouth daily. 30 each 0     Review of Systems Physical Exam   Blood pressure 139/84, pulse 91, temperature 98.3 F (36.8 C), temperature source Oral, resp. rate 15, last menstrual period 10/10/2020, SpO2 100 %.  Physical Exam  Fetal Tracing:  Baseline: Variability: Accels: Decels:  Toco:   MAU Course  Procedures Results for orders placed or performed during the hospital encounter of 06/08/21 (from the past 24 hour(s))  Urinalysis, Routine w reflex microscopic Urine, Clean Catch     Status: Abnormal   Collection Time: 06/08/21  9:26 AM  Result Value Ref  Range   Color, Urine YELLOW YELLOW   APPearance CLEAR CLEAR   Specific Gravity, Urine 1.015 1.005 - 1.030   pH 6.5 5.0 - 8.0   Glucose, UA NEGATIVE NEGATIVE mg/dL   Hgb urine dipstick NEGATIVE NEGATIVE   Bilirubin Urine NEGATIVE NEGATIVE   Ketones, ur NEGATIVE NEGATIVE mg/dL   Protein, ur NEGATIVE NEGATIVE mg/dL   Nitrite NEGATIVE NEGATIVE   Leukocytes,Ua SMALL (A) NEGATIVE  Urinalysis, Microscopic (reflex)     Status: Abnormal   Collection Time: 06/08/21  9:26 AM  Result Value Ref Range   RBC / HPF 0-5 0 - 5 RBC/hpf   WBC, UA 6-10 0 - 5 WBC/hpf   Bacteria, UA MANY (A) NONE SEEN   Squamous Epithelial / LPF 0-5 0 - 5   Mucus PRESENT     MDM UA NST reactive Patient reports normal fetal movement.  Assessment and Plan   1. NST (non-stress test) reactive   2. Movement of fetus present during pregnancy in third trimester   3. [redacted] weeks gestation of pregnancy    -Discharge home in stable condition -Fetal movement precautions discussed -Patient advised to follow-up with OB as scheduled for prenatal care -Patient may return to MAU as needed or if her condition were to change or worsen   Rolm Bookbinder CNM 06/08/2021, 9:31 AM

## 2021-06-08 NOTE — MAU Note (Signed)
.  Brandi [name] is a 25 y.o. at [redacted]w[redacted]d here in MAU reporting: DFM this morning, last time she felt baby move was 0700. Denies VB or LOF.   Pain score: 0  FHT:142

## 2021-06-08 NOTE — Discharge Instructions (Signed)

## 2021-06-13 ENCOUNTER — Encounter: Payer: Medicaid Other | Admitting: Physical Therapy

## 2021-06-13 ENCOUNTER — Encounter: Payer: Self-pay | Admitting: Certified Nurse Midwife

## 2021-06-20 ENCOUNTER — Encounter: Payer: Medicaid Other | Admitting: Physical Therapy

## 2021-06-22 ENCOUNTER — Other Ambulatory Visit: Payer: Self-pay

## 2021-06-22 ENCOUNTER — Ambulatory Visit (INDEPENDENT_AMBULATORY_CARE_PROVIDER_SITE_OTHER): Payer: Medicaid Other | Admitting: Certified Nurse Midwife

## 2021-06-22 ENCOUNTER — Other Ambulatory Visit (HOSPITAL_COMMUNITY)
Admission: RE | Admit: 2021-06-22 | Discharge: 2021-06-22 | Disposition: A | Discharge status: Home / Self Care | Payer: Medicaid Other | Source: Ambulatory Visit | Attending: Certified Nurse Midwife | Admitting: Certified Nurse Midwife

## 2021-06-22 VITALS — BP 127/81 | HR 98 | Temp 97.9°F | Wt 192.0 lb

## 2021-06-22 DIAGNOSIS — Z23 Encounter for immunization: Secondary | ICD-10-CM

## 2021-06-22 DIAGNOSIS — O26899 Other specified pregnancy related conditions, unspecified trimester: Secondary | ICD-10-CM

## 2021-06-22 DIAGNOSIS — Z3480 Encounter for supervision of other normal pregnancy, unspecified trimester: Secondary | ICD-10-CM | POA: Insufficient documentation

## 2021-06-22 DIAGNOSIS — Z3A36 36 weeks gestation of pregnancy: Secondary | ICD-10-CM | POA: Diagnosis not present

## 2021-06-22 DIAGNOSIS — R102 Pelvic and perineal pain: Secondary | ICD-10-CM | POA: Diagnosis present

## 2021-06-23 ENCOUNTER — Encounter: Payer: Self-pay | Admitting: Certified Nurse Midwife

## 2021-06-23 DIAGNOSIS — B009 Herpesviral infection, unspecified: Secondary | ICD-10-CM

## 2021-06-23 NOTE — Progress Notes (Signed)
° °  PRENATAL VISIT NOTE  Subjective:  Brandi [name] is a 25 y.o. G2P0010 at [redacted]w[redacted]d being seen today for ongoing prenatal care.  She is currently monitored for the following issues for this low-risk pregnancy and has Supervision of other normal pregnancy, antepartum on their problem list.  Patient reports  some nightly cramping and increased vaginal discharge .  Contractions: Irregular. Vag. Bleeding: None.  Movement: Present. Denies leaking of fluid.   The following portions of the patient's history were reviewed and updated as appropriate: allergies, current medications, past family history, past medical history, past social history, past surgical history and problem list.   Objective:   Vitals:   06/22/21 1347  BP: 127/81  Pulse: 98  Temp: 97.9 F (36.6 C)  Weight: 192 lb (87.1 kg)    Fetal Status: Fetal Heart Rate (bpm): 148 Fundal Height: 36 cm Movement: Present     General:  Alert, oriented and cooperative. Patient is in no acute distress.  Skin: Skin is warm and dry. No rash noted.   Cardiovascular: Normal heart rate noted  Respiratory: Normal respiratory effort, no problems with respiration noted  Abdomen: Soft, gravid, appropriate for gestational age.  Pain/Pressure: Present     Pelvic: Cervical exam deferred        Extremities: Normal range of motion.  Edema: None  Mental Status: Normal mood and affect. Normal behavior. Normal judgment and thought content.   Assessment and Plan:  Pregnancy: G2P0010 at [redacted]w[redacted]d 1. Supervision of other normal pregnancy, antepartum - Doing well, feeling regular and vigorous fetal movement   2. [redacted] weeks gestation of pregnancy - Routine OB care  - Culture, beta strep (group b only)  3. Need for tetanus, diphtheria, and acellular pertussis (Tdap) vaccine in patient of adolescent age or older - Tdap vaccine greater than or equal to 7yo IM  4. Pelvic cramping in antepartum period - Has started drinking RRL tea, advised she can stop if  cramping is disruptive and restart after 39wks.  - Cervicovaginal ancillary only( )  Preterm labor symptoms and general obstetric precautions including but not limited to vaginal bleeding, contractions, leaking of fluid and fetal movement were reviewed in detail with the patient. Please refer to After Visit Summary for other counseling recommendations.   Return in about 1 week (around 06/29/2021) for IN-PERSON, LOB.  Future Appointments  Date Time Provider Department Center  07/06/2021  8:55 AM Brand Males, CNM [facility] None  07/13/2021  9:15 AM Bernerd Limbo, CNM [facility] None  07/20/2021  8:55 AM Calvert Cantor, CNM [facility] None    Bernerd Limbo, [place]

## 2021-06-24 LAB — CERVICOVAGINAL ANCILLARY ONLY
Bacterial Vaginitis (gardnerella): NEGATIVE
Candida Glabrata: NEGATIVE
Candida Vaginitis: NEGATIVE
Chlamydia: NEGATIVE
Comment: NEGATIVE
Comment: NEGATIVE
Comment: NEGATIVE
Comment: NEGATIVE
Comment: NEGATIVE
Comment: NORMAL
Neisseria Gonorrhea: NEGATIVE
Trichomonas: NEGATIVE

## 2021-06-26 LAB — CULTURE, BETA STREP (GROUP B ONLY): Strep Gp B Culture: NEGATIVE

## 2021-06-28 MED ORDER — VALACYCLOVIR HCL 500 MG PO TABS
500.0000 mg | ORAL_TABLET | Freq: Two times a day (BID) | ORAL | 0 refills | Status: DC
Start: 2021-06-28 — End: 2021-11-15

## 2021-07-03 NOTE — L&D Delivery Note (Signed)
Delivery Note At 6:50 PM a viable and healthy female was delivered via Vaginal, Spontaneous (Presentation: Left Occiput Anterior).  APGAR: 9, 9; weight 8 lb 5.7 oz (3790 g).   Placenta status: Spontaneous, Intact.  Cord: 3 vessels with the following complications: None.    Anesthesia: Epidural Episiotomy: None Lacerations: 2nd degree;Perineal Suture Repair: 3.0 monocryl Est. Blood Loss (mL): 200  Mom to postpartum.  Baby to Couplet care / Skin to Skin.  Brandi [name] is a 26 y.o. female G2P1011 with IUP at [redacted]w[redacted]d admitted for SOL/SROM.  P/C ratio was elevated with elevated BP on admission and PEC without severe features was diagnosed.  She progressed without augmentation to 9 cm but remained 9 x 4 hours so Pitocin was started to augment to 10 cm and pt pushed less than 30 minutes to deliver.  Cord clamping delayed by several minutes then clamped by CNM and cut by FOB.  Placenta intact and spontaneous, bleeding minimal.  Second degree perineal laceration with multiple labial extensions repaired by CNM and Dr Macon Large called to room for assistance with repair. Monocryl 3.0 x 3 packages used.  Mom and baby stable prior to transfer to postpartum. She plans on breastfeeding. She is undecided about birth control.  [name] 07/14/2021, 9:42 AM

## 2021-07-06 ENCOUNTER — Other Ambulatory Visit: Payer: Self-pay

## 2021-07-06 ENCOUNTER — Other Ambulatory Visit (HOSPITAL_COMMUNITY)
Admission: RE | Admit: 2021-07-06 | Discharge: 2021-07-06 | Disposition: A | Discharge status: Home / Self Care | Payer: Medicaid Other | Source: Ambulatory Visit | Attending: Student | Admitting: Student

## 2021-07-06 ENCOUNTER — Ambulatory Visit (INDEPENDENT_AMBULATORY_CARE_PROVIDER_SITE_OTHER): Payer: Medicaid Other

## 2021-07-06 VITALS — BP 130/75 | HR 73 | Temp 98.0°F | Wt 196.6 lb

## 2021-07-06 DIAGNOSIS — O26899 Other specified pregnancy related conditions, unspecified trimester: Secondary | ICD-10-CM | POA: Insufficient documentation

## 2021-07-06 DIAGNOSIS — N898 Other specified noninflammatory disorders of vagina: Secondary | ICD-10-CM

## 2021-07-06 DIAGNOSIS — Z3480 Encounter for supervision of other normal pregnancy, unspecified trimester: Secondary | ICD-10-CM

## 2021-07-06 DIAGNOSIS — Z3A38 38 weeks gestation of pregnancy: Secondary | ICD-10-CM | POA: Diagnosis present

## 2021-07-06 NOTE — Progress Notes (Signed)
° °  PRENATAL VISIT NOTE  Subjective:  Brandi [name] is a 26 y.o. G2P0010 at [redacted]w[redacted]d being seen today for ongoing prenatal care.  She is currently monitored for the following issues for this low-risk pregnancy and has Supervision of other normal pregnancy, antepartum on their problem list.  Patient reports decreased fetal movement. This has been ongoing over the last 4-5 days. Reports she feels movement, but it just "feels different" and not as intense. She has felt baby move since arrival to office. Contractions: Irregular. Vag. Bleeding: None.  Movement: (!) Decreased. Denies leaking of fluid.   The following portions of the patient's history were reviewed and updated as appropriate: allergies, current medications, past family history, past medical history, past social history, past surgical history and problem list.   Objective:   Vitals:   07/06/21 0900  BP: 130/75  Pulse: 73  Temp: 98 F (36.7 C)  Weight: 196 lb 9.6 oz (89.2 kg)    Fetal Status:   Fundal Height: 38 cm Movement: (!) Decreased    NST: 135bpm, moderate variability, +15x15 accels, no decels Toco: irregular General:  Alert, oriented and cooperative. Patient is in no acute distress.  Skin: Skin is warm and dry. No rash noted.   Cardiovascular: Normal heart rate noted  Respiratory: Normal respiratory effort, no problems with respiration noted  Abdomen: Soft, gravid, appropriate for gestational age.  Pain/Pressure: Present     Pelvic: Cervical exam performed in the presence of a chaperone Dilation: 1 Effacement (%): 70 Station: Ballotable  Extremities: Normal range of motion.  Edema: None  Mental Status: Normal mood and affect. Normal behavior. Normal judgment and thought content.   Assessment and Plan:  Pregnancy: G2P0010 at [redacted]w[redacted]d 1. Supervision of other normal pregnancy, antepartum - ROB - Decreased fetal movement, but reports feeling normal movement since arrival to office. NST reactive and reassuring. Strict  precautions reviewed and when to go to MAU.  - Reassured that strength/force of fetal movement common in later in pregnancy. Reviewed kick counts. - Anticipatory guidance for upcoming appointments provided  2. [redacted] weeks gestation of pregnancy    Term labor symptoms and general obstetric precautions including but not limited to vaginal bleeding, contractions, leaking of fluid and fetal movement were reviewed in detail with the patient. Please refer to After Visit Summary for other counseling recommendations.   Return in about 1 week (around 07/13/2021).  Future Appointments  Date Time Provider Department Center  07/13/2021  9:15 AM Bernerd Limbo, CNM [facility] None  07/20/2021  8:55 AM Calvert Cantor, CNM [facility] None     Brand Males, [place] 07/06/21 10:43 AM

## 2021-07-07 LAB — CERVICOVAGINAL ANCILLARY ONLY
Bacterial Vaginitis (gardnerella): NEGATIVE
Candida Glabrata: NEGATIVE
Candida Vaginitis: NEGATIVE
Chlamydia: NEGATIVE
Comment: NEGATIVE
Comment: NEGATIVE
Comment: NEGATIVE
Comment: NEGATIVE
Comment: NEGATIVE
Comment: NORMAL
Neisseria Gonorrhea: NEGATIVE
Trichomonas: NEGATIVE

## 2021-07-08 ENCOUNTER — Encounter: Payer: Medicaid Other | Admitting: Physical Therapy

## 2021-07-12 ENCOUNTER — Inpatient Hospital Stay (HOSPITAL_COMMUNITY)
Admission: AD | Admit: 2021-07-12 | Discharge: 2021-07-15 | DRG: 805 | Disposition: A | Discharge status: Home / Self Care | Payer: Medicaid Other | Attending: Obstetrics & Gynecology | Admitting: Obstetrics & Gynecology

## 2021-07-12 ENCOUNTER — Encounter (HOSPITAL_COMMUNITY): Payer: Self-pay | Admitting: Obstetrics and Gynecology

## 2021-07-12 ENCOUNTER — Other Ambulatory Visit: Payer: Self-pay

## 2021-07-12 DIAGNOSIS — O1494 Unspecified pre-eclampsia, complicating childbirth: Principal | ICD-10-CM | POA: Diagnosis present

## 2021-07-12 DIAGNOSIS — O1414 Severe pre-eclampsia complicating childbirth: Secondary | ICD-10-CM | POA: Diagnosis not present

## 2021-07-12 DIAGNOSIS — O9832 Other infections with a predominantly sexual mode of transmission complicating childbirth: Secondary | ICD-10-CM | POA: Diagnosis present

## 2021-07-12 DIAGNOSIS — Z3A39 39 weeks gestation of pregnancy: Secondary | ICD-10-CM | POA: Diagnosis not present

## 2021-07-12 DIAGNOSIS — A6000 Herpesviral infection of urogenital system, unspecified: Secondary | ICD-10-CM | POA: Diagnosis present

## 2021-07-12 DIAGNOSIS — O9852 Other viral diseases complicating childbirth: Secondary | ICD-10-CM | POA: Diagnosis present

## 2021-07-12 DIAGNOSIS — Z87891 Personal history of nicotine dependence: Secondary | ICD-10-CM

## 2021-07-12 DIAGNOSIS — O4290 Premature rupture of membranes, unspecified as to length of time between rupture and onset of labor, unspecified weeks of gestation: Secondary | ICD-10-CM | POA: Diagnosis present

## 2021-07-12 DIAGNOSIS — O1493 Unspecified pre-eclampsia, third trimester: Secondary | ICD-10-CM

## 2021-07-12 DIAGNOSIS — U071 COVID-19: Secondary | ICD-10-CM | POA: Diagnosis present

## 2021-07-12 DIAGNOSIS — O26893 Other specified pregnancy related conditions, third trimester: Secondary | ICD-10-CM | POA: Diagnosis present

## 2021-07-12 DIAGNOSIS — O42913 Preterm premature rupture of membranes, unspecified as to length of time between rupture and onset of labor, third trimester: Secondary | ICD-10-CM | POA: Diagnosis not present

## 2021-07-12 DIAGNOSIS — O1490 Unspecified pre-eclampsia, unspecified trimester: Secondary | ICD-10-CM

## 2021-07-13 ENCOUNTER — Inpatient Hospital Stay (HOSPITAL_COMMUNITY): Payer: Medicaid Other | Admitting: Anesthesiology

## 2021-07-13 ENCOUNTER — Encounter (HOSPITAL_COMMUNITY): Payer: Self-pay | Admitting: Obstetrics and Gynecology

## 2021-07-13 ENCOUNTER — Encounter: Payer: Medicaid Other | Admitting: Certified Nurse Midwife

## 2021-07-13 DIAGNOSIS — U071 COVID-19: Secondary | ICD-10-CM

## 2021-07-13 DIAGNOSIS — Z3A39 39 weeks gestation of pregnancy: Secondary | ICD-10-CM

## 2021-07-13 DIAGNOSIS — O1490 Unspecified pre-eclampsia, unspecified trimester: Secondary | ICD-10-CM

## 2021-07-13 DIAGNOSIS — O9852 Other viral diseases complicating childbirth: Secondary | ICD-10-CM

## 2021-07-13 DIAGNOSIS — O42913 Preterm premature rupture of membranes, unspecified as to length of time between rupture and onset of labor, third trimester: Secondary | ICD-10-CM

## 2021-07-13 DIAGNOSIS — O1414 Severe pre-eclampsia complicating childbirth: Secondary | ICD-10-CM

## 2021-07-13 DIAGNOSIS — O4290 Premature rupture of membranes, unspecified as to length of time between rupture and onset of labor, unspecified weeks of gestation: Secondary | ICD-10-CM | POA: Diagnosis present

## 2021-07-13 HISTORY — DX: COVID-19: U07.1

## 2021-07-13 HISTORY — DX: Unspecified pre-eclampsia, unspecified trimester: O14.90

## 2021-07-13 LAB — COMPREHENSIVE METABOLIC PANEL
ALT: 14 U/L (ref 0–44)
AST: 20 U/L (ref 15–41)
Albumin: 3.1 g/dL — ABNORMAL LOW (ref 3.5–5.0)
Alkaline Phosphatase: 86 U/L (ref 38–126)
Anion gap: 11 (ref 5–15)
BUN: 8 mg/dL (ref 6–20)
CO2: 17 mmol/L — ABNORMAL LOW (ref 22–32)
Calcium: 8.9 mg/dL (ref 8.9–10.3)
Chloride: 105 mmol/L (ref 98–111)
Creatinine, Ser: 0.81 mg/dL (ref 0.44–1.00)
GFR, Estimated: >60 mL/min (ref >60)
Glucose, Bld: 75 mg/dL (ref 70–99)
Potassium: 3.7 mmol/L (ref 3.5–5.1)
Sodium: 133 mmol/L — ABNORMAL LOW (ref 135–145)
Total Bilirubin: 0.7 mg/dL (ref 0.3–1.2)
Total Protein: 6.4 g/dL — ABNORMAL LOW (ref 6.5–8.1)

## 2021-07-13 LAB — CBC
HCT: 39.9 % (ref 36.0–46.0)
Hemoglobin: 13.7 g/dL (ref 12.0–15.0)
MCH: 30.5 pg (ref 26.0–34.0)
MCHC: 34.3 g/dL (ref 30.0–36.0)
MCV: 88.9 fL (ref 80.0–100.0)
Platelets: 194 10*3/uL (ref 150–400)
RBC: 4.49 MIL/uL (ref 3.87–5.11)
RDW: 13.3 % (ref 11.5–15.5)
WBC: 9.5 10*3/uL (ref 4.0–10.5)
nRBC: 0 % (ref 0.0–0.2)

## 2021-07-13 LAB — RPR: RPR Ser Ql: NONREACTIVE

## 2021-07-13 LAB — TYPE AND SCREEN
ABO/RH(D): O POS
Antibody Screen: NEGATIVE

## 2021-07-13 LAB — PROTEIN / CREATININE RATIO, URINE
Creatinine, Urine: 117.95 mg/dL
Protein Creatinine Ratio: 0.31 mg/mg{Cre} — ABNORMAL HIGH (ref 0.00–0.15)
Total Protein, Urine: 36 mg/dL

## 2021-07-13 LAB — RESP PANEL BY RT-PCR (FLU A&B, COVID) ARPGX2
Influenza A by PCR: NEGATIVE
Influenza B by PCR: NEGATIVE
SARS Coronavirus 2 by RT PCR: POSITIVE — AB

## 2021-07-13 LAB — POCT FERN TEST: POCT Fern Test: POSITIVE

## 2021-07-13 MED ORDER — FENTANYL-BUPIVACAINE-NACL 0.5-0.125-0.9 MG/250ML-% EP SOLN
12.0000 mL/h | EPIDURAL | Status: DC | PRN
  Administered 2021-07-13 (×2): 12 mL/h via EPIDURAL
  Filled 2021-07-13 (×2): qty 250

## 2021-07-13 MED ORDER — DIBUCAINE (PERIANAL) 1 % EX OINT: 1.0000 "application " | TOPICAL_OINTMENT | CUTANEOUS | Status: DC | PRN

## 2021-07-13 MED ORDER — ONDANSETRON HCL 4 MG/2ML IJ SOLN: 4.0000 mg | Freq: Four times a day (QID) | INTRAMUSCULAR | Status: DC | PRN

## 2021-07-13 MED ORDER — SIMETHICONE 80 MG PO CHEW: 80.0000 mg | CHEWABLE_TABLET | ORAL | Status: DC | PRN

## 2021-07-13 MED ORDER — DIPHENHYDRAMINE HCL 50 MG/ML IJ SOLN: 12.5000 mg | INTRAMUSCULAR | Status: DC | PRN

## 2021-07-13 MED ORDER — IBUPROFEN 600 MG PO TABS
600.0000 mg | ORAL_TABLET | Freq: Four times a day (QID) | ORAL | Status: DC
  Administered 2021-07-14 – 2021-07-15 (×7): 600 mg via ORAL
  Filled 2021-07-13 (×7): qty 1

## 2021-07-13 MED ORDER — LACTATED RINGERS IV SOLN: 500.0000 mL | INTRAVENOUS | Status: DC | PRN

## 2021-07-13 MED ORDER — OXYTOCIN-SODIUM CHLORIDE 30-0.9 UT/500ML-% IV SOLN
2.5000 [IU]/h | INTRAVENOUS | Status: DC
  Filled 2021-07-13: qty 500

## 2021-07-13 MED ORDER — DIPHENHYDRAMINE HCL 25 MG PO CAPS: 25.0000 mg | ORAL_CAPSULE | Freq: Four times a day (QID) | ORAL | Status: DC | PRN

## 2021-07-13 MED ORDER — PHENYLEPHRINE 40 MCG/ML (10ML) SYRINGE FOR IV PUSH (FOR BLOOD PRESSURE SUPPORT): 80.0000 ug | PREFILLED_SYRINGE | INTRAVENOUS | Status: DC | PRN

## 2021-07-13 MED ORDER — LACTATED RINGERS IV SOLN: INTRAVENOUS | Status: DC

## 2021-07-13 MED ORDER — LIDOCAINE HCL (PF) 1 % IJ SOLN: 30.0000 mL | INTRAMUSCULAR | Status: DC | PRN

## 2021-07-13 MED ORDER — FLEET ENEMA 7-19 GM/118ML RE ENEM: 1.0000 | ENEMA | RECTAL | Status: DC | PRN

## 2021-07-13 MED ORDER — FENTANYL CITRATE (PF) 100 MCG/2ML IJ SOLN
100.0000 ug | INTRAMUSCULAR | Status: DC | PRN
  Administered 2021-07-13: 100 ug via INTRAVENOUS
  Filled 2021-07-13: qty 2

## 2021-07-13 MED ORDER — OXYTOCIN-SODIUM CHLORIDE 30-0.9 UT/500ML-% IV SOLN
1.0000 m[IU]/min | INTRAVENOUS | Status: DC
  Administered 2021-07-13: 2 m[IU]/min via INTRAVENOUS

## 2021-07-13 MED ORDER — OXYTOCIN BOLUS FROM INFUSION
333.0000 mL | Freq: Once | INTRAVENOUS | Status: AC
  Administered 2021-07-13: 333 mL via INTRAVENOUS

## 2021-07-13 MED ORDER — BENZOCAINE-MENTHOL 20-0.5 % EX AERO
1.0000 "application " | INHALATION_SPRAY | CUTANEOUS | Status: DC | PRN
  Administered 2021-07-13: 1 via TOPICAL
  Filled 2021-07-13: qty 56

## 2021-07-13 MED ORDER — EPHEDRINE 5 MG/ML INJ: 10.0000 mg | INTRAVENOUS | Status: DC | PRN

## 2021-07-13 MED ORDER — SENNOSIDES-DOCUSATE SODIUM 8.6-50 MG PO TABS
2.0000 | ORAL_TABLET | Freq: Every day | ORAL | Status: DC
  Administered 2021-07-14 – 2021-07-15 (×2): 2 via ORAL
  Filled 2021-07-13 (×2): qty 2

## 2021-07-13 MED ORDER — ACETAMINOPHEN 325 MG PO TABS
650.0000 mg | ORAL_TABLET | ORAL | Status: DC | PRN
  Administered 2021-07-14: 650 mg via ORAL
  Filled 2021-07-13: qty 2

## 2021-07-13 MED ORDER — ONDANSETRON HCL 4 MG/2ML IJ SOLN: 4.0000 mg | INTRAMUSCULAR | Status: DC | PRN

## 2021-07-13 MED ORDER — LIDOCAINE HCL (PF) 1 % IJ SOLN
INTRAMUSCULAR | Status: DC | PRN
  Administered 2021-07-13 (×2): 5 mL via EPIDURAL

## 2021-07-13 MED ORDER — LACTATED RINGERS IV SOLN
500.0000 mL | Freq: Once | INTRAVENOUS | Status: AC
  Administered 2021-07-13: 500 mL via INTRAVENOUS

## 2021-07-13 MED ORDER — ONDANSETRON HCL 4 MG PO TABS: 4.0000 mg | ORAL_TABLET | ORAL | Status: DC | PRN

## 2021-07-13 MED ORDER — SOD CITRATE-CITRIC ACID 500-334 MG/5ML PO SOLN: 30.0000 mL | ORAL | Status: DC | PRN

## 2021-07-13 MED ORDER — PRENATAL MULTIVITAMIN CH
1.0000 | ORAL_TABLET | Freq: Every day | ORAL | Status: DC
  Administered 2021-07-14 – 2021-07-15 (×2): 1 via ORAL
  Filled 2021-07-13 (×2): qty 1

## 2021-07-13 MED ORDER — TERBUTALINE SULFATE 1 MG/ML IJ SOLN: 0.2500 mg | Freq: Once | INTRAMUSCULAR | Status: DC | PRN

## 2021-07-13 MED ORDER — ACETAMINOPHEN 325 MG PO TABS
650.0000 mg | ORAL_TABLET | ORAL | Status: DC | PRN
  Administered 2021-07-13: 650 mg via ORAL
  Filled 2021-07-13: qty 2

## 2021-07-13 MED ORDER — COCONUT OIL OIL
1.0000 "application " | TOPICAL_OIL | Status: DC | PRN
  Administered 2021-07-14: 1 via TOPICAL

## 2021-07-13 MED ORDER — OXYCODONE-ACETAMINOPHEN 5-325 MG PO TABS: 2.0000 | ORAL_TABLET | ORAL | Status: DC | PRN

## 2021-07-13 MED ORDER — HYDROXYZINE HCL 50 MG PO TABS: 50.0000 mg | ORAL_TABLET | Freq: Four times a day (QID) | ORAL | Status: DC | PRN

## 2021-07-13 MED ORDER — OXYCODONE-ACETAMINOPHEN 5-325 MG PO TABS: 1.0000 | ORAL_TABLET | ORAL | Status: DC | PRN

## 2021-07-13 MED ORDER — WITCH HAZEL-GLYCERIN EX PADS: 1.0000 "application " | MEDICATED_PAD | CUTANEOUS | Status: DC | PRN

## 2021-07-13 NOTE — Lactation Note (Signed)
This note was copied from a baby's chart. LC talked to RN, Brandi [name] to see if good time to come for latch. Mom in midst of a repair at this time. LC to follow up after repair for latch assistance.

## 2021-07-13 NOTE — Discharge Summary (Signed)
° °  Postpartum Discharge Summary ° °Date of Service updated: 07/15/2021 ° °   °Patient Name: Brandi [name] °DOB: 01/20/1996 °MRN: [id number] ° °Date of admission: 07/12/2021 °Delivery date:07/13/2021  °Delivering provider: LEFTWICH-KIRBY, LISA A  °Date of discharge: 07/15/2021 ° °Admitting diagnosis: Leakage, amniotic fluid [O42.90] °Intrauterine pregnancy: [redacted]w[redacted]d     °Secondary diagnosis:  Principal Problem: °  Vaginal delivery °Active Problems: °  Leakage, amniotic fluid °  Preeclampsia °  COVID-19 virus infection °  Perineal laceration complicating delivery ° °Additional problems: None    °Discharge diagnosis: Term Pregnancy Delivered                                              °Post partum procedures: None °Augmentation: Pitocin °Complications: None ° °Hospital course: Onset of Labor With Vaginal Delivery      °25 y.o. yo G2P1011 at [redacted]w[redacted]d was admitted in Latent Labor on 07/12/2021. Patient had an uncomplicated labor course as follows:  °Membrane Rupture Time/Date: 10:45 PM ,07/12/2021   °Delivery Method:Vaginal, Spontaneous  °Episiotomy: None  °Lacerations:  2nd degree;Perineal  ° °Pt admitted for SOL/SROM.  P/C ratio was elevated with elevated BP on admission and PEC without severe features was diagnosed.  She progressed without augmentation to 9 cm but remained 9 x 4 hours so Pitocin was started to augment to 10 cm and pt pushed less than 30 minutes to deliver.  Cord clamping delayed by several minutes then clamped by CNM and cut by FOB.  Placenta intact and spontaneous, bleeding minimal.  Second degree perineal laceration with multiple labial extensions repaired by CNM and Dr Anyanwu called to room for assistance with repair. Monocryl 3.0 x 3 packages used.  °  °Patient had an uncomplicated postpartum course.  Her blood pressures were elevated to 130-140s systolic, high 80s-90s diastolic. She was started on Lasix, which she will continue for 3 days for a total 5-day course. She was also started on Procardia  30mg daily for elevated pressures, which she will continue until she is reevaluated at her postpartum follow-up. She is ambulating, tolerating a regular diet, passing flatus, and urinating well. Patient is discharged home in stable condition on 07/15/21. ° °Newborn Data: °Birth date:07/13/2021  °Birth time:6:50 PM  °Gender:Female  °Living status:Living  °Apgars:9 ,9  °Weight:3790 g  ° °Magnesium Sulfate received: No °BMZ received: No °Rhophylac: N/A °MMR: N/A °T-DaP: Given prenatally °Flu: No °Transfusion: No  ° °Physical exam  °Vitals:  ° 07/14/21 1214 07/14/21 1817 07/14/21 1942 07/15/21 0500  °BP: 123/74 120/76 126/78 133/64  °Pulse: 78 80 78 70  °Resp:   18 16  °Temp: 98.5 °F (36.9 °C)  98.9 °F (37.2 °C) 98.9 °F (37.2 °C)  °TempSrc:   Oral Oral  °SpO2: 100% 100% 99% 99%  ° °General: WDWN AAF laying comfortably in bed, alert, cooperative, and no distress with baby laying on her chest °Lochia: appropriate °Uterine Fundus: firm °Incision: N/A °DVT Evaluation: No evidence of DVT seen on physical exam. °No significant calf/ankle edema. ° °Labs: °Lab Results  °Component Value Date  ° WBC 9.5 07/13/2021  ° HGB 13.7 07/13/2021  ° HCT 39.9 07/13/2021  ° MCV 88.9 07/13/2021  ° PLT 194 07/13/2021  ° °CMP Latest Ref Rng & Units 07/13/2021  °Glucose 70 - 99 mg/dL 75  °BUN 6 - 20 mg/dL 8  °Creatinine 0.44 - 1.00 mg/dL 0.81  °  Sodium 135 - 145 mmol/L 133(L)  Potassium 3.5 - 5.1 mmol/L 3.7  Chloride 98 - 111 mmol/L 105  CO2 22 - 32 mmol/L 17(L)  Calcium 8.9 - 10.3 mg/dL 8.9  Total Protein 6.5 - 8.1 g/dL 6.4(L)  Total Bilirubin 0.3 - 1.2 mg/dL 0.7  Alkaline Phos 38 - 126 U/L 86  AST 15 - 41 U/L 20  ALT 0 - 44 U/L 14   Edinburgh Score: No flowsheet data found.   After visit meds:  Allergies as of 07/15/2021   No Known Allergies      Medication List     STOP taking these medications    cyclobenzaprine 10 MG tablet Commonly known as: FLEXERIL   Magnesium Oxide 200 MG Tabs Commonly known as: Mag-Oxide    nitrofurantoin (macrocrystal-monohydrate) 100 MG capsule Commonly known as: MACROBID   ondansetron 4 MG disintegrating tablet Commonly known as: Zofran ODT       TAKE these medications    acetaminophen 325 MG tablet Commonly known as: Tylenol Take 2 tablets (650 mg total) by mouth every 4 (four) hours as needed (for pain scale < 4).   Blood Pressure Monitor Automat Devi 1 Device by Does not apply route daily. Automatic blood pressure cuff regular size. To monitor blood pressure regularly at home. ICD-10 code:Z34.90   docusate sodium 100 MG capsule Commonly known as: COLACE Take 1 capsule (100 mg total) by mouth every 12 (twelve) hours.   furosemide 20 MG tablet Commonly known as: LASIX Take 1 tablet (20 mg total) by mouth daily for 3 days. Start taking on: July 16, 2021   Gojji Weight Scale Misc 1 Device by Does not apply route daily as needed. To weight self daily as needed at home. ICD-10 code: Z34.90   ibuprofen 600 MG tablet Commonly known as: ADVIL Take 1 tablet (600 mg total) by mouth every 6 (six) hours as needed.   NIFEdipine 30 MG 24 hr tablet Commonly known as: ADALAT CC Take 1 tablet (30 mg total) by mouth daily. Start taking on: July 16, 2021   polyethylene glycol 17 g packet Commonly known as: MiraLax Take 17 g by mouth daily.   prenatal multivitamin Tabs tablet Take 1 tablet by mouth daily at 12 noon.   valACYclovir 500 MG tablet Commonly known as: Valtrex Take 1 tablet (500 mg total) by mouth 2 (two) times daily.         Discharge home in stable condition Infant Feeding: Breast Infant Disposition: home with mother  Discharge instruction: per After Visit Summary and Postpartum booklet. Activity: Advance as tolerated. Pelvic rest for 6 weeks.  Diet: routine diet Future Appointments: Future Appointments  Date Time Provider [place]  07/25/2021  9:30 AM [place] None  08/17/2021  2:35 PM [place], [place] None   Follow up Visit: Message sent to [facility] by Dr. Gwenlyn Perking on 07/13/20.   Please schedule this patient for a In person postpartum visit in 6 weeks with the following provider: Any provider. Additional Postpartum F/U: BP check 1 week  High risk pregnancy complicated by:  Pre-eclampsia  Delivery mode:  Vaginal, Spontaneous  Anticipated Birth Control:  Unsure  07/15/2021 Fabiola Backer, MD

## 2021-07-13 NOTE — Progress Notes (Signed)
Brandi [name] is a 26 y.o. G2P0010 at [redacted]w[redacted]d by LMP c/w early [place] admitted for active labor, rupture of membranes  Subjective: Pt comfortable with epidural. Family and doula at bedside for support.  Objective: BP 114/69    Pulse 80    Temp 98.2 F (36.8 C) (Oral)    Resp 18    LMP 10/10/2020    SpO2 99%  I/O last 3 completed shifts: In: -  Out: 325 [Urine:325] No intake/output data recorded.  FHT:  FHR: 120 bpm, variability: moderate,  accelerations:  Present,  decelerations:  Present occasional variables UC:   regular, every 2-3 minutes SVE:   Dilation: 8 Effacement (%): 100 Station: Plus 1 Exam by:: Leftwich-Kirby, CNM Positive scalp stimulation on exam  Labs: Lab Results  Component Value Date   WBC 9.5 07/13/2021   HGB 13.7 07/13/2021   HCT 39.9 07/13/2021   MCV 88.9 07/13/2021   PLT 194 07/13/2021    Assessment / Plan: Spontaneous labor, progressing normally  Labor: Progressing normally Preeclampsia:   n/a Fetal Wellbeing:  Category I Pain Control:  Epidural I/D:   GBS neg Anticipated MOD:  NSVD  [name] 07/13/2021, 10:54 AM

## 2021-07-13 NOTE — Lactation Note (Signed)
This note was copied from a baby's chart. Lactation Consultation Note  Patient Name: Brandi [name] [id number] Date: 07/13/2021 Reason for consult: L&D Initial assessment;Mother's request;Primapara;1st time breastfeeding;Breastfeeding assistance Age:27 hours LC returned to assist with latching in football position. Mom denied any pain with the latch and infant still feeding at the end of the visit.   Mom to feed based on cues 8-12x 24hr period, feeding cues reviewed.  Mom to receive follow up support on the floor with lactation  Maternal Data Has patient been taught Hand Expression?: Yes  Feeding Mother's Current Feeding Choice: Breast Milk  LATCH Score Latch: Repeated attempts needed to sustain latch, nipple held in mouth throughout feeding, stimulation needed to elicit sucking reflex.  Audible Swallowing: Spontaneous and intermittent  Type of Nipple: Everted at rest and after stimulation  Comfort (Breast/Nipple): Soft / non-tender  Hold (Positioning): Assistance needed to correctly position infant at breast and maintain latch.  LATCH Score: 8   Lactation Tools Discussed/Used    Interventions Interventions: Breast feeding basics reviewed;Skin to skin;Assisted with latch;Breast massage;Hand express;Breast compression;Adjust position;Support pillows;Education  Discharge    Consult Status Consult Status: Follow-up from L&D Date: 07/14/21 Follow-up type: In-patient    Verna Hamon  Nicholson-Springer 07/13/2021, 8:22 PM

## 2021-07-13 NOTE — H&P (Signed)
Brandi [name] is a 26 y.o. G2P0010 female at [redacted]w[redacted]d by LMP c/w 9wk u/s presenting w/ SROM @ 2245, clear fluid and regular painful contractions.   Reports active fetal movement, contractions: regular, vaginal bleeding: none, membranes: ruptured, clear fluid. Had planned waterbirth, attended class and signed consent. No longer wants, wants epidural now.  Denies ha, visual changes, ruq/epigastric pain, n/v.   Initiated prenatal care at [facility] then transferred to [facility] at 19 wks.    This pregnancy complicated by: none  Prenatal History/Complications:  SAB x 1  Past Medical History: Past Medical History:  Diagnosis Date   Anxiety    Depression    HSV-1 infection    Onychomycosis 12/16/2020   PTSD (post-traumatic stress disorder)    Tinea corporis 01/11/2021   UTI (urinary tract infection)     Past Surgical History: Past Surgical History:  Procedure Laterality Date   HERNIA REPAIR      Obstetrical History: OB History     Gravida  2   Para      Term      Preterm      AB  1   Living         SAB  1   IAB      Ectopic      Multiple      Live Births              Social History: Social History   Socioeconomic History   Marital status: Married    Spouse name: Not on file   Number of children: Not on file   Years of education: Not on file   Highest education level: Associate degree: academic program  Occupational History   Occupation: unemployed  Tobacco Use   Smoking status: Former    Packs/day: 0.50    Types: Cigarettes    Passive exposure: Never   Smokeless tobacco: Never  Vaping Use   Vaping Use: Never used  Substance and Sexual Activity   Alcohol use: Not Currently    Comment: occasional   Drug use: Never   Sexual activity: Yes    Birth control/protection: None  Other Topics Concern   Not on file  Social History Narrative   Not on file   Social Determinants of Health   Financial Resource Strain: Not on file  Food  Insecurity: Not on file  Transportation Needs: Not on file  Physical Activity: Not on file  Stress: Not on file  Social Connections: Not on file    Family History: Family History  Problem Relation Age of Onset   Heart disease Mother    Stroke Mother    Hypertension Mother    Bipolar disorder Mother    Healthy Father    Anxiety disorder Sister    Depression Sister    Hypertension Sister    Cancer Maternal Grandmother    Hypertension Maternal Grandmother    Cancer Maternal Grandfather    Stroke Maternal Grandfather    Depression Paternal Grandmother    Hypertension Paternal Grandmother     Allergies: No Known Allergies  Medications Prior to Admission  Medication Sig Dispense Refill Last Dose   Prenatal Vit-Fe Fumarate-FA (PRENATAL MULTIVITAMIN) TABS tablet Take 1 tablet by mouth daily at 12 noon.   07/12/2021   valACYclovir (VALTREX) 500 MG tablet Take 1 tablet (500 mg total) by mouth 2 (two) times daily. 60 tablet 0 07/12/2021   Blood Pressure Monitoring (BLOOD PRESSURE MONITOR AUTOMAT) DEVI 1 Device by Does not apply  route daily. Automatic blood pressure cuff regular size. To monitor blood pressure regularly at home. ICD-10 code:Z34.90 (Patient not taking: Reported on 05/11/2021) 1 each 0    cyclobenzaprine (FLEXERIL) 10 MG tablet Take 1 tablet (10 mg total) by mouth every 8 (eight) hours as needed for muscle spasms. (Patient not taking: Reported on 06/22/2021) 30 tablet 1    docusate sodium (COLACE) 100 MG capsule Take 1 capsule (100 mg total) by mouth every 12 (twelve) hours. (Patient not taking: Reported on 06/22/2021) 60 capsule 2    Magnesium Oxide (MAG-OXIDE) 200 MG TABS Take 2 tablets (400 mg total) by mouth at bedtime. If that amount causes loose stools in the am, switch to 200mg  daily at bedtime. (Patient not taking: Reported on 06/22/2021) 60 tablet 3    Misc. Devices (GOJJI WEIGHT SCALE) MISC 1 Device by Does not apply route daily as needed. To weight self daily as needed  at home. ICD-10 code: Z34.90 (Patient not taking: Reported on 06/22/2021) 1 each 0    nitrofurantoin, macrocrystal-monohydrate, (MACROBID) 100 MG capsule Take 1 capsule (100 mg total) by mouth 2 (two) times daily. (Patient not taking: Reported on 06/22/2021) 6 capsule 0    ondansetron (ZOFRAN ODT) 4 MG disintegrating tablet Take 1 tablet (4 mg total) by mouth every 8 (eight) hours as needed for up to 4 doses for nausea or vomiting. (Patient not taking: Reported on 06/22/2021) 20 tablet 0    polyethylene glycol (MIRALAX) 17 g packet Take 17 g by mouth daily. (Patient not taking: Reported on 06/22/2021) 30 each 0     Review of Systems  Pertinent pos/neg as indicated in HPI  Blood pressure 131/76, pulse 74, temperature 98.2 F (36.8 C), temperature source Oral, resp. rate 18, last menstrual period 10/10/2020, SpO2 100 %. General appearance: alert and moderate distress/painful contractions Lungs: clear to auscultation bilaterally Heart: regular rate and rhythm Abdomen: gravid, soft, non-tender Extremities: tr edema DTR's 2+  Fetal monitoring: FHR: 120 bpm, variability: moderate,  Accelerations: Present,  decelerations:  Present  mild variable Uterine activity: q 2-545mins Dilation: 4 Effacement (%): 80 Station: -1 Exam by:: Zenia ResidesNikki Risheq, RN Presentation: cephalic   Prenatal labs: ABO, Rh: O/Positive/-- (06/23 0000) Antibody: Negative (06/23 0000) Rubella: Immune (06/23 0000) RPR: Non Reactive (10/19 0813)  HBsAg: Negative (06/23 0000)  HIV: Non Reactive (10/19 0813)  GBS: Negative/-- (12/21 1410)  2hr GTT: normal  Prenatal Transfer Tool  Maternal Diabetes: No Genetic Screening: Normal Maternal Ultrasounds/Referrals: Normal Fetal Ultrasounds or other Referrals:  None Maternal Substance Abuse:  No Significant Maternal Medications:  Meds include: Other: valtrex, flexeril Significant Maternal Lab Results: Group B Strep negative  Results for orders placed or performed during the  hospital encounter of 07/12/21 (from the past 24 hour(s))  CBC   Collection Time: 07/13/21 12:04 AM  Result Value Ref Range   WBC 9.5 4.0 - 10.5 K/uL   RBC 4.49 3.87 - 5.11 MIL/uL   Hemoglobin 13.7 12.0 - 15.0 g/dL   HCT 16.139.9 09.636.0 - 04.546.0 %   MCV 88.9 80.0 - 100.0 fL   MCH 30.5 26.0 - 34.0 pg   MCHC 34.3 30.0 - 36.0 g/dL   RDW 40.913.3 81.111.5 - 91.415.5 %   Platelets 194 150 - 400 K/uL   nRBC 0.0 0.0 - 0.2 %  Fern Test   Collection Time: 07/13/21 12:34 AM  Result Value Ref Range   POCT Fern Test Positive = ruptured amniotic membanes      Assessment:  4012w2d  SIUP  G2P0010  SROM/SOL  Cat 2 FHR  GBS Negative/-- (12/21 1410)  Elevated bp, asymptomatic   No longer wants waterbirth     Plan:  Admit to L&D  IV pain meds/epidural prn active labor  Expectant management  Monitor bp's & check pre-e labs   Anticipate NSVB   Plans to breastfeed  Contraception: undecided  Circumcision: yes  Cheral Marker CNM, WHNP-BC 07/13/2021, 12:38 AM

## 2021-07-13 NOTE — Progress Notes (Signed)
Patient ID: Brandi [name], female   DOB: 11/16/1995, 26 y.o.   MRN: [id number] Brandi [name] is a 26 y.o. G2P0010 at [redacted]w[redacted]d admitted for active labor and SROM  Subjective: comfortable with epidural and no complaints  Objective: BP 127/80    Pulse 78    Temp 98.1 F (36.7 C) (Oral)    Resp 18    LMP 10/10/2020    SpO2 99%  Total I/O In: -  Out: 325 [Urine:325]  FHR baseline 125 bpm, Variability: moderate, Accelerations:present, Decelerations:  Absent Toco: q 1-47mins   SVE:   Dilation: 7 Effacement (%): 80 Station: -2 Exam by:: Joellyn Haff, CNM  Labs: Lab Results  Component Value Date   WBC 9.5 07/13/2021   HGB 13.7 07/13/2021   HCT 39.9 07/13/2021   MCV 88.9 07/13/2021   PLT 194 07/13/2021    Assessment / Plan: Spontaneous labor, progressing normally  Labor: active Fetal Wellbeing:  Category I Pain Control:  epidural Pre-eclampsia: N/A I/D:  GBS neg Anticipated MOD: NSVB  Cheral Marker CNM, WHNP-BC 07/13/2021, 6:43 AM

## 2021-07-13 NOTE — Anesthesia Procedure Notes (Signed)
Epidural Patient location during procedure: OB Start time: 07/13/2021 1:14 AM End time: 07/13/2021 1:24 AM  Staffing Anesthesiologist: Josephine Igo, MD Performed: anesthesiologist   Preanesthetic Checklist Completed: patient identified, IV checked, site marked, risks and benefits discussed, surgical consent, monitors and equipment checked, pre-op evaluation and timeout performed  Epidural Patient position: sitting Prep: DuraPrep and site prepped and draped Patient monitoring: continuous pulse ox and blood pressure Approach: midline Location: L3-L4 Injection technique: LOR air  Needle:  Needle type: Tuohy  Needle gauge: 17 G Needle length: 9 cm and 9 Needle insertion depth: 6 cm Catheter type: closed end flexible Catheter size: 19 Gauge Catheter at skin depth: 11 cm Test dose: negative and Other  Assessment Events: blood not aspirated, injection not painful, no injection resistance, no paresthesia and negative IV test  Additional Notes Patient identified. Risks and benefits discussed including failed block, incomplete  Pain control, post dural puncture headache, nerve damage, paralysis, blood pressure Changes, nausea, vomiting, reactions to medications-both toxic and allergic and post Partum back pain. All questions were answered. Patient expressed understanding and wished to proceed. Sterile technique was used throughout procedure. Epidural site was Dressed with sterile barrier dressing. No paresthesias, signs of intravascular injection Or signs of intrathecal spread were encountered.  Patient was more comfortable after the epidural was dosed. Please see RN's note for documentation of vital signs and FHR which are stable. Reason for block:procedure for pain

## 2021-07-13 NOTE — Anesthesia Preprocedure Evaluation (Signed)
Anesthesia Evaluation  Patient identified by MRN, date of birth, ID band Patient awake    Reviewed: Allergy & Precautions, Patient's Chart, lab work & pertinent test results  Airway Mallampati: II       Dental no notable dental hx.    Pulmonary former smoker,    Pulmonary exam normal        Cardiovascular negative cardio ROS Normal cardiovascular exam     Neuro/Psych PSYCHIATRIC DISORDERS Anxiety Depression PTSDnegative neurological ROS     GI/Hepatic Neg liver ROS, GERD  ,  Endo/Other  Obesity  Renal/GU negative Renal ROS  negative genitourinary   Musculoskeletal negative musculoskeletal ROS (+)   Abdominal (+) + obese,   Peds  Hematology negative hematology ROS (+)   Anesthesia Other Findings   Reproductive/Obstetrics (+) Pregnancy SROM                             Anesthesia Physical Anesthesia Plan  ASA: 2  Anesthesia Plan: Epidural   Post-op Pain Management:    Induction:   PONV Risk Score and Plan:   Airway Management Planned: Natural Airway  Additional Equipment:   Intra-op Plan:   Post-operative Plan:   Informed Consent: I have reviewed the patients History and Physical, chart, labs and discussed the procedure including the risks, benefits and alternatives for the proposed anesthesia with the patient or authorized representative who has indicated his/her understanding and acceptance.       Plan Discussed with: Anesthesiologist  Anesthesia Plan Comments:         Anesthesia Quick Evaluation

## 2021-07-14 MED ORDER — NIFEDIPINE ER OSMOTIC RELEASE 30 MG PO TB24
30.0000 mg | ORAL_TABLET | Freq: Every day | ORAL | Status: DC
  Administered 2021-07-14 – 2021-07-15 (×2): 30 mg via ORAL
  Filled 2021-07-14 (×2): qty 1

## 2021-07-14 MED ORDER — FUROSEMIDE 20 MG PO TABS
20.0000 mg | ORAL_TABLET | Freq: Every day | ORAL | Status: DC
  Administered 2021-07-14 – 2021-07-15 (×2): 20 mg via ORAL
  Filled 2021-07-14 (×2): qty 1

## 2021-07-14 MED ORDER — OXYCODONE HCL 5 MG PO TABS
5.0000 mg | ORAL_TABLET | ORAL | Status: DC | PRN
  Administered 2021-07-14 (×3): 5 mg via ORAL
  Filled 2021-07-14 (×3): qty 1

## 2021-07-14 NOTE — Anesthesia Postprocedure Evaluation (Signed)
Anesthesia Post Note  Patient: Engineer, petroleum  Procedure(s) Performed: AN AD HOC LABOR EPIDURAL     Patient location during evaluation: Mother Baby Anesthesia Type: Epidural Level of consciousness: awake, oriented and awake and alert Pain management: pain level controlled Vital Signs Assessment: post-procedure vital signs reviewed and stable Respiratory status: respiratory function stable, spontaneous breathing and nonlabored ventilation Cardiovascular status: stable Postop Assessment: no headache, adequate PO intake, able to ambulate, patient able to bend at knees and no apparent nausea or vomiting Anesthetic complications: no   No notable events documented.  Last Vitals:  Vitals:   07/14/21 0409 07/14/21 0750  BP: 120/76 126/72  Pulse: 65 80  Resp: 15   Temp: 37.1 C 37.1 C  SpO2: 97% 100%    Last Pain:  Vitals:   07/14/21 0750  TempSrc:   PainSc: 7    Pain Goal:                   Javarie Crisp

## 2021-07-14 NOTE — Lactation Note (Signed)
This note was copied from a baby's chart. Lactation Consultation Note Late entry for 0200. Asked RN to call for [facility] when mom would like assistance feeding and see Lactation.  Patient Name: Boy Maribi Malley [id number] Date: 07/14/2021   Age:27 hours  Maternal Data    Feeding    LATCH Score                    Lactation Tools Discussed/Used    Interventions    Discharge    Consult Status      Theodoro Kalata 07/14/2021, 4:32 AM

## 2021-07-14 NOTE — Progress Notes (Signed)
Post Partum Day #1 Subjective: no complaints, up ad lib, and tolerating PO; breastfeeding going okay; wants to discuss contraception at her PP visit; denies s/s of pre-e; has had BP values last evening that meet criteria for meds (BPs 133/86, 127/84, 138/88)  -she desires a circ for her son and was consented with a note placed on pt's chart  Objective: Blood pressure 126/72, pulse 80, temperature 98.7 F (37.1 C), resp. rate 15, last menstrual period 10/10/2020, SpO2 100 %, unknown if currently breastfeeding.  Physical Exam:  General: alert, cooperative, and no distress Lochia: appropriate Uterine Fundus: firm DVT Evaluation: No evidence of DVT seen on physical exam.  Recent Labs    07/13/21 0004  HGB 13.7  HCT 39.9    Assessment/Plan: Plan for discharge tomorrow and Circumcision prior to discharge Procardia XL 30mg  qd Lasix 10mg  qd   LOS: 2 days   Myrtis Ser CNM 07/14/2021, 9:23 AM

## 2021-07-14 NOTE — Social Work (Signed)
CSW received consult for hx of Anxiety, PTSD, and Depression.  Due to isolation precautions, CSW contacted MOB by phone to complete assessment.   CSW introduced self and role. CSW informed MOB of reason for consult and assessed current mood. MOB reported she is currently in a lot of pain, but otherwise doing fine. MOB disclosed she was diagnosed with anxiety and depression in 2021. MOB reports she did not experience any mental health concerns related to the diagnosis during the pregnancy. MOB shared she has never been on medication, but has attended therapy in the past, which she found to be helpful. CSW discussed coping mechanisms with MOB. MOB reported that hobbies such as yoga, meditation and deep breathing assist with managing symptoms. MOB identified a strong support system consisting of FOB, her immediate family and FOB's family. MOB denies any current SI, HI or DV.   CSW reviewed baby blues period versus perinatal mood disorders. MOB was provided with the New Mom Checklist and encouraged to self-evaluate. CSW provided MOB with mental health resources and encouraged MOB to seek professional assistance if needs arise.   CSW reviewed Sudden Infant Death Syndrome precautions with MOB. MOB reported she has all infant essentials, including a bassinet and car seat. MOB identified [facility] for follow-up care and denies any barriers to care. CSW provided MOB with information on [facility], as requested. MOB reported no additional needs at this time.   CSW identifies no further need for intervention and no barriers to discharge at this time.   Darra Lis, [place] [employer] [phone]

## 2021-07-14 NOTE — Lactation Note (Signed)
This note was copied from a baby's chart. Lactation Consultation Note  Patient Name: Brandi [name] [id number] Date: 07/14/2021 Reason for consult: Follow-up assessment;Term;Primapara;1st time breastfeeding Age:27 hours   P1 mother whose infant is now 77 hours old.  This is a term baby at 39+2 weeks.  Mother's current feeding preference is breast.  Mother had been breast feeding in the football hold for approximately 24 minutes when I arrived.  Baby had a wide gape and flanged lips.  Suggested mother place him more deeply into the breast and demonstrated.  Showed parents how to gently stimulate to keep him actively engaged with his feeding.  Continued to observe for an additional 10 minutes before releasing baby.  Mother complaining of cramping and generalized pain.  RN aware and has given medication.  Encouraged to continue feeding on cue and to call her RN/LC for latch assistance as needed.  Father and visitor present.  RN updated.     Maternal Data Has patient been taught Hand Expression?: Yes Does the patient have breastfeeding experience prior to this delivery?: No  Feeding Mother's Current Feeding Choice: Breast Milk  LATCH Score Latch: Grasps breast easily, tongue down, lips flanged, rhythmical sucking.  Audible Swallowing: None  Type of Nipple: Everted at rest and after stimulation  Comfort (Breast/Nipple): Soft / non-tender  Hold (Positioning): Assistance needed to correctly position infant at breast and maintain latch.  LATCH Score: 7   Lactation Tools Discussed/Used    Interventions Interventions: Breast feeding basics reviewed;Skin to skin;Breast compression;Support pillows;Education;LC Services brochure  Discharge Pump: Personal  Consult Status Consult Status: Follow-up Date: 07/15/21 Follow-up type: In-patient    Royce Sciara R Wardell Pokorski 07/14/2021, 11:51 AM

## 2021-07-14 NOTE — Lactation Note (Signed)
This note was copied from a baby's chart. Lactation Consultation Note  Patient Name: Brandi [name] [id number] Date: 07/14/2021 Reason for consult: Initial assessment Age:27 hours   LC Note:  RN to call for [facility] when mother is ready to feed baby.     Maternal Data    Feeding    LATCH Score                    Lactation Tools Discussed/Used    Interventions    Discharge    Consult Status Consult Status: Follow-up from L&D Date: 07/14/21 Follow-up type: In-patient    [name] [name] 07/14/2021, 7:53 AM

## 2021-07-15 ENCOUNTER — Other Ambulatory Visit (HOSPITAL_COMMUNITY): Payer: Self-pay

## 2021-07-15 MED ORDER — NIFEDIPINE ER 30 MG PO TB24
30.0000 mg | ORAL_TABLET | Freq: Every day | ORAL | 0 refills | Status: DC
  Filled 2021-07-15: qty 30, 30d supply, fill #0

## 2021-07-15 MED ORDER — FUROSEMIDE 20 MG PO TABS
20.0000 mg | ORAL_TABLET | Freq: Every day | ORAL | 0 refills | Status: DC
  Filled 2021-07-15: qty 3, 3d supply, fill #0

## 2021-07-15 MED ORDER — IBUPROFEN 600 MG PO TABS
600.0000 mg | ORAL_TABLET | Freq: Four times a day (QID) | ORAL | 0 refills | Status: DC | PRN
  Filled 2021-07-15: qty 30, 8d supply, fill #0

## 2021-07-15 MED ORDER — ACETAMINOPHEN 325 MG PO TABS: 650.0000 mg | ORAL_TABLET | ORAL | Status: DC | PRN

## 2021-07-15 NOTE — Lactation Note (Signed)
This note was copied from a baby's chart. Lactation Consultation Note  Patient Name: Brandi [name] [id number] Date: 07/15/2021 Reason for consult: Follow-up assessment;Term;Primapara;1st time breastfeeding;Infant weight loss;Breastfeeding assistance;Other (Comment) (7 % weight/ post circ/ mom / dad concerned  cuz baby has not fed since well since early am. LC offered to assist , unwrapped baby, assisted to latch  Rt. Breast / football/  latched with depth, was still feeding at 13 mins w/ swallows/ mom comfortable.) Age:35 hours LC reviewed BF D/C teaching and basics.  LC reassured mom and dad breast feeding is going with latch, and doc flow sheets . Mom aware of the [facility] resources after D/C.   Maternal Data    Feeding Mother's Current Feeding Choice: Breast Milk  LATCH Score Latch: Grasps breast easily, tongue down, lips flanged, rhythmical sucking.  Audible Swallowing: Spontaneous and intermittent  Type of Nipple: Everted at rest and after stimulation  Comfort (Breast/Nipple): Filling, red/small blisters or bruises, mild/mod discomfort  Hold (Positioning): Assistance needed to correctly position infant at breast and maintain latch.  LATCH Score: 8   Lactation Tools Discussed/Used    Interventions Interventions: Breast feeding basics reviewed;Assisted with latch;Skin to skin;Breast massage;Hand express;Breast compression;Adjust position;Support pillows;Position options;Education;LC Services brochure  Discharge Discharge Education: Engorgement and breast care Pump: Personal;DEBP;Manual  Consult Status Consult Status: Complete Date: 07/15/21    Myer Haff 07/15/2021, 2:33 PM

## 2021-07-19 ENCOUNTER — Encounter: Payer: Self-pay | Admitting: Family Medicine

## 2021-07-20 ENCOUNTER — Encounter: Payer: Medicaid Other | Admitting: Advanced Practice Midwife

## 2021-07-25 ENCOUNTER — Ambulatory Visit (INDEPENDENT_AMBULATORY_CARE_PROVIDER_SITE_OTHER): Payer: Medicaid Other | Admitting: *Deleted

## 2021-07-25 ENCOUNTER — Other Ambulatory Visit: Payer: Self-pay

## 2021-07-25 VITALS — BP 119/79 | HR 68 | Temp 98.1°F | Ht 63.0 in | Wt 173.2 lb

## 2021-07-25 DIAGNOSIS — O165 Unspecified maternal hypertension, complicating the puerperium: Secondary | ICD-10-CM

## 2021-07-25 NOTE — Progress Notes (Signed)
° °  Subjective:  Brandi [name] is a 26 y.o. female here for BP check.   Hypertension ROS: taking medications as instructed, no medication side effects noted, no TIA's, no chest pain on exertion, no dyspnea on exertion, no swelling of ankles, no orthostatic dizziness or lightheadedness, no orthopnea or paroxysmal nocturnal dyspnea, and no palpitations.    Objective:  BP 119/79 (BP Location: Left Arm, Patient Position: Sitting, Cuff Size: Normal)    Pulse 68    Temp 98.1 F (36.7 C) (Oral)    Ht 5\' 3"  (1.6 m)    Wt 173 lb 3.2 oz (78.6 kg)    Breastfeeding Yes    BMI 30.68 kg/m    Home BP: 132/85   Appearance alert, well appearing, and in no distress, oriented to person, place, and time, and normal appearing weight. General exam BP noted to be well controlled today in office.    Assessment:   Blood Pressure well controlled, stable, and asymptomatic.   Plan:  Current treatment plan is effective, no change in therapy.  , RN

## 2021-07-27 ENCOUNTER — Telehealth (HOSPITAL_COMMUNITY): Payer: Self-pay | Admitting: *Deleted

## 2021-07-27 NOTE — Telephone Encounter (Signed)
Attempted hospital discharge follow-up call. Left message for patient to return RN call. Deforest Hoyles, RN, 07/27/21, [phone]

## 2021-08-17 ENCOUNTER — Other Ambulatory Visit: Payer: Self-pay

## 2021-08-17 ENCOUNTER — Ambulatory Visit (INDEPENDENT_AMBULATORY_CARE_PROVIDER_SITE_OTHER): Payer: Medicaid Other | Admitting: Certified Nurse Midwife

## 2021-08-17 DIAGNOSIS — O165 Unspecified maternal hypertension, complicating the puerperium: Secondary | ICD-10-CM

## 2021-08-17 NOTE — Progress Notes (Signed)
° ° °  Post Partum Visit Note  Brandi [name] is a 26 y.o. G5P1011 female who presents for a postpartum visit. She is 5 weeks postpartum following a normal spontaneous vaginal delivery.  I have fully reviewed the prenatal and intrapartum course. The delivery was at [redacted]w[redacted]d gestational weeks.  Anesthesia: epidural. Postpartum course has been uncomplicated. Baby is doing well. Baby is feeding by breast. Bleeding staining only. Bowel function is normal. Bladder function is normal. Patient is not sexually active. Contraception method is none. Postpartum depression screening: positive: score 7.  Health Maintenance Due  Topic Date Due   COVID-19 Vaccine (1) Never done   HPV VACCINES (1 - Risk 3-dose series) Never done    The following portions of the patient's history were reviewed and updated as appropriate: allergies, current medications, past family history, past medical history, past social history, past surgical history, and problem list.  Review of Systems Pertinent items noted in HPI and remainder of comprehensive ROS otherwise negative.  Objective:  BP (!) 143/84 (BP Location: Left Arm, Patient Position: Sitting, Cuff Size: Normal)    Pulse 66    Temp 98.4 F (36.9 C) (Oral)    Ht 5\' 3"  (1.6 m)    Wt 166 lb 3.2 oz (75.4 kg)    Breastfeeding Yes    BMI 29.44 kg/m    General:  alert, cooperative, appears stated age, and no distress   Breasts:  normal  Lungs: Normal effort  Heart:  regular rate and rhythm  Abdomen: Soft, non-tender    Wound N/A  GU exam:  not indicated       Assessment:   Postpartum care and examination  Plan:   Essential components of care per ACOG recommendations:  1.  Mood and well being: Patient with negative depression screening today. Reviewed local resources for support.  - Patient tobacco use? No.   - hx of drug use? No.    2. Infant care and feeding:  -Patient currently breastmilk feeding? Yes. Discussed returning to work and pumping. Reviewed  importance of draining breast regularly to support lactation.  -Social determinants of health (SDOH) reviewed in EPIC. No concerns  3. Sexuality, contraception and birth spacing - Patient does not want a pregnancy in the next year.  Desired family size is 1 child for now.  - Reviewed forms of contraception in tiered fashion. Patient desired condoms, natural family planning (NFP) today.   - Discussed birth spacing of 18 months  4. Sleep and fatigue -Encouraged family/partner/community support of 4 hrs of uninterrupted sleep to help with mood and fatigue  5. Physical Recovery  - Discussed patients delivery and complications. She describes her labor as good. - Patient had a Vaginal, no problems at delivery. Patient had a 2nd degree laceration. Perineal healing reviewed. Patient expressed understanding - Patient has urinary incontinence? No. - Patient is safe to resume physical and sexual activity  6.  Health Maintenance - HM due items addressed Yes - Last pap smear 12/22/20 NILM. Pap smear not done at today's visit.  -Breast Cancer screening indicated? No.   7. Chronic Disease/Pregnancy Condition follow up: Hypertension - PCP follow up for management of continued HTN. Referral placed and procardia refilled  12/24/20, CNM, MSN, IBCLC Certified Nurse Midwife, [facility]

## 2021-08-19 ENCOUNTER — Encounter: Payer: Self-pay | Admitting: Certified Nurse Midwife

## 2021-08-28 MED ORDER — NIFEDIPINE ER 30 MG PO TB24: 30.0000 mg | ORAL_TABLET | Freq: Every day | ORAL | 2 refills | Status: DC

## 2021-09-19 ENCOUNTER — Telehealth: Payer: Self-pay | Admitting: Family Medicine

## 2021-09-19 NOTE — Telephone Encounter (Signed)
Patient was seen at the [facility] office, she want a nurse from here to call her she have a few concerns since she got her pp appointment.  ?

## 2021-09-20 NOTE — Telephone Encounter (Signed)
I called patient and left a Message I was returnig her call and if she still has questions to call us back and leave a message on nurse voicemail or send MyChart message. ?Brandi [name] ?

## 2021-09-29 ENCOUNTER — Emergency Department (HOSPITAL_COMMUNITY): Payer: Medicaid Other

## 2021-09-29 ENCOUNTER — Emergency Department (HOSPITAL_COMMUNITY)
Admission: EM | Admit: 2021-09-29 | Discharge: 2021-09-30 | Disposition: A | Discharge status: Home / Self Care | Payer: Medicaid Other | Attending: Emergency Medicine | Admitting: Emergency Medicine

## 2021-09-29 ENCOUNTER — Other Ambulatory Visit: Payer: Self-pay

## 2021-09-29 ENCOUNTER — Encounter (HOSPITAL_COMMUNITY): Payer: Self-pay | Admitting: Emergency Medicine

## 2021-09-29 DIAGNOSIS — R0781 Pleurodynia: Secondary | ICD-10-CM | POA: Diagnosis not present

## 2021-09-29 DIAGNOSIS — F419 Anxiety disorder, unspecified: Secondary | ICD-10-CM | POA: Diagnosis not present

## 2021-09-29 DIAGNOSIS — R079 Chest pain, unspecified: Secondary | ICD-10-CM

## 2021-09-29 DIAGNOSIS — R0602 Shortness of breath: Secondary | ICD-10-CM | POA: Diagnosis present

## 2021-09-29 LAB — CBC WITH DIFFERENTIAL/PLATELET
Abs Immature Granulocytes: 0.01 10*3/uL (ref 0.00–0.07)
Basophils Absolute: 0 10*3/uL (ref 0.0–0.1)
Basophils Relative: 1 %
Eosinophils Absolute: 0.1 10*3/uL (ref 0.0–0.5)
Eosinophils Relative: 2 %
HCT: 42.2 % (ref 36.0–46.0)
Hemoglobin: 13.9 g/dL (ref 12.0–15.0)
Immature Granulocytes: 0 %
Lymphocytes Relative: 55 %
Lymphs Abs: 3.7 10*3/uL (ref 0.7–4.0)
MCH: 28.7 pg (ref 26.0–34.0)
MCHC: 32.9 g/dL (ref 30.0–36.0)
MCV: 87.2 fL (ref 80.0–100.0)
Monocytes Absolute: 0.3 10*3/uL (ref 0.1–1.0)
Monocytes Relative: 5 %
Neutro Abs: 2.4 10*3/uL (ref 1.7–7.7)
Neutrophils Relative %: 37 %
Platelets: 283 10*3/uL (ref 150–400)
RBC: 4.84 MIL/uL (ref 3.87–5.11)
RDW: 12.2 % (ref 11.5–15.5)
WBC: 6.6 10*3/uL (ref 4.0–10.5)
nRBC: 0 % (ref 0.0–0.2)

## 2021-09-29 LAB — URINALYSIS, ROUTINE W REFLEX MICROSCOPIC
Bilirubin Urine: NEGATIVE
Glucose, UA: NEGATIVE mg/dL
Ketones, ur: NEGATIVE mg/dL
Nitrite: NEGATIVE
Protein, ur: NEGATIVE mg/dL
Specific Gravity, Urine: 1.003 — ABNORMAL LOW (ref 1.005–1.030)
pH: 7 (ref 5.0–8.0)

## 2021-09-29 LAB — COMPREHENSIVE METABOLIC PANEL
ALT: 29 U/L (ref 0–44)
AST: 27 U/L (ref 15–41)
Albumin: 4.1 g/dL (ref 3.5–5.0)
Alkaline Phosphatase: 40 U/L (ref 38–126)
Anion gap: 9 (ref 5–15)
BUN: 12 mg/dL (ref 6–20)
CO2: 22 mmol/L (ref 22–32)
Calcium: 9.4 mg/dL (ref 8.9–10.3)
Chloride: 108 mmol/L (ref 98–111)
Creatinine, Ser: 1.01 mg/dL — ABNORMAL HIGH (ref 0.44–1.00)
GFR, Estimated: >60 mL/min (ref >60)
Glucose, Bld: 104 mg/dL — ABNORMAL HIGH (ref 70–99)
Potassium: 3.4 mmol/L — ABNORMAL LOW (ref 3.5–5.1)
Sodium: 139 mmol/L (ref 135–145)
Total Bilirubin: 1.2 mg/dL (ref 0.3–1.2)
Total Protein: 7.1 g/dL (ref 6.5–8.1)

## 2021-09-29 LAB — BRAIN NATRIURETIC PEPTIDE: B Natriuretic Peptide: 11.8 pg/mL (ref 0.0–100.0)

## 2021-09-29 LAB — PREGNANCY, URINE: Preg Test, Ur: NEGATIVE

## 2021-09-29 LAB — TROPONIN I (HIGH SENSITIVITY): Troponin I (High Sensitivity): 4 ng/L (ref <18)

## 2021-09-29 MED ORDER — ALBUTEROL SULFATE HFA 108 (90 BASE) MCG/ACT IN AERS: 2.0000 | INHALATION_SPRAY | RESPIRATORY_TRACT | Status: DC | PRN

## 2021-09-29 NOTE — ED Triage Notes (Signed)
Pt BIB EMS from home with c/o SOB that she thinks is related to her anxiety. Pt is 11 weeks post partum per EMS. Per pt, her midwife continues to put her on new medications that she is not taken and does not know what they are.  ?

## 2021-09-30 ENCOUNTER — Emergency Department (HOSPITAL_COMMUNITY): Payer: Medicaid Other

## 2021-09-30 LAB — TROPONIN I (HIGH SENSITIVITY): Troponin I (High Sensitivity): 4 ng/L (ref <18)

## 2021-09-30 MED ORDER — IOHEXOL 350 MG/ML SOLN
40.0000 mL | Freq: Once | INTRAVENOUS | Status: AC | PRN
  Administered 2021-09-30: 40 mL via INTRAVENOUS

## 2021-09-30 NOTE — ED Provider Notes (Signed)
?[facility] ?Provider Note ? ? ?CSN: [id number] ?Arrival date & time: 09/29/21  2141 ? ?  ? ?History ? ?Chief Complaint  ?Patient presents with  ? Shortness of Breath  ? ? ?Brandi [name] is a 26 y.o. female. ? ?The history is provided by the patient and medical records.  ?Shortness of Breath ? ?26 year old female approximately 11 weeks postpartum presenting to the ED with shortness of breath.  States she has been feeling this way for a few weeks now, seemed a little worse today.  She has been having episodes of chest pain, racing heartbeat, and feeling anxious.  Does have history of anxiety and does have some increase in this due to being a first time mother.  She denies any fever, chills, cough, sore throat, or other URI symptoms.  Denies hx of DVT or PE.  States she was on procardia during pregnancy and afterwards was switched to nifedipine but unsure why so she has not been taking it.   ? ?Home Medications ?Prior to Admission medications   ?Medication Sig Start Date End Date Taking? Authorizing Provider  ?acetaminophen (TYLENOL) 325 MG tablet Take 2 tablets (650 mg total) by mouth every 4 (four) hours as needed (for pain scale < 4). ?Patient not taking: Reported on 08/17/2021 07/15/21   Raylene EvertsWilliams, Ashley S, MD  ?Blood Pressure Monitoring (BLOOD PRESSURE MONITOR AUTOMAT) DEVI 1 Device by Does not apply route daily. Automatic blood pressure cuff regular size. To monitor blood pressure regularly at home. ICD-10 code:Z34.90 ?Patient not taking: Reported on 05/11/2021 02/23/21   Bernerd LimboWalker, Jamilla R, CNM  ?docusate sodium (COLACE) 100 MG capsule Take 1 capsule (100 mg total) by mouth every 12 (twelve) hours. ?Patient not taking: Reported on 06/22/2021 02/14/21   Gerrit HeckEmly, Jessica, CNM  ?furosemide (LASIX) 20 MG tablet Take 1 tablet (20 mg total) by mouth daily for 3 days. 07/16/21 07/19/21  Raylene EvertsWilliams, Ashley S, MD  ?ibuprofen (ADVIL) 600 MG tablet Take 1 tablet (600 mg total) by mouth  every 6 (six) hours as needed. ?Patient not taking: Reported on 08/17/2021 07/15/21   Raylene EvertsWilliams, Ashley S, MD  ?Misc. Devices (GOJJI WEIGHT SCALE) MISC 1 Device by Does not apply route daily as needed. To weight self daily as needed at home. ICD-10 code: Z34.90 ?Patient not taking: Reported on 06/22/2021 02/23/21   Bernerd LimboWalker, Jamilla R, CNM  ?NIFEdipine (ADALAT CC) 30 MG 24 hr tablet Take 1 tablet (30 mg total) by mouth daily. 08/28/21 09/27/21  Bernerd LimboWalker, Jamilla R, CNM  ?polyethylene glycol (MIRALAX) 17 g packet Take 17 g by mouth daily. ?Patient not taking: Reported on 06/22/2021 02/14/21   Gerrit HeckEmly, Jessica, CNM  ?Prenatal Vit-Fe Fumarate-FA (PRENATAL MULTIVITAMIN) TABS tablet Take 1 tablet by mouth daily at 12 noon. ?Patient not taking: Reported on 08/17/2021    [provider]  ?valACYclovir (VALTREX) 500 MG tablet Take 1 tablet (500 mg total) by mouth 2 (two) times daily. ?Patient not taking: Reported on 08/17/2021 06/28/21   Bernerd LimboWalker, Jamilla R, CNM  ?   ? ?Allergies    ?Patient has no known allergies.   ? ?Review of Systems   ?Review of Systems  ?Respiratory:  Positive for shortness of breath.   ?All other systems reviewed and are negative. ? ?Physical Exam ?Updated Vital Signs ?BP 129/86 (BP Location: Right Arm)   Pulse 60   Temp 98 ?F (36.7 ?C)   Resp 18   Ht 5\' 3"  (1.6 m)   Wt 72.6 kg   SpO2  100%   BMI 28.34 kg/m?  ? ?Physical Exam ?Vitals and nursing note reviewed.  ?Constitutional:   ?   Appearance: She is well-developed.  ?HENT:  ?   Head: Normocephalic and atraumatic.  ?Eyes:  ?   Conjunctiva/sclera: Conjunctivae normal.  ?   Pupils: Pupils are equal, round, and reactive to light.  ?Cardiovascular:  ?   Rate and Rhythm: Normal rate and regular rhythm.  ?   Heart sounds: Normal heart sounds.  ?Pulmonary:  ?   Effort: Pulmonary effort is normal.  ?   Breath sounds: Normal breath sounds. No wheezing or rhonchi.  ?Abdominal:  ?   General: Bowel sounds are normal.  ?   Palpations: Abdomen is soft.   ?Musculoskeletal:     ?   General: Normal range of motion.  ?   Cervical back: Normal range of motion.  ?Skin: ?   General: Skin is warm and dry.  ?Neurological:  ?   Mental Status: She is alert and oriented to person, place, and time.  ?Psychiatric:  ?   Comments: Calm, does not seem overly anxious  ? ? ?ED Results / Procedures / Treatments   ?Labs ?(all labs ordered are listed, but only abnormal results are displayed) ?Labs Reviewed  ?COMPREHENSIVE METABOLIC PANEL - Abnormal; Notable for the following components:  ?    Result Value  ? Potassium 3.4 (*)   ? Glucose, Bld 104 (*)   ? Creatinine, Ser 1.01 (*)   ? All other components within normal limits  ?URINALYSIS, ROUTINE W REFLEX MICROSCOPIC - Abnormal; Notable for the following components:  ? Color, Urine STRAW (*)   ? APPearance HAZY (*)   ? Specific Gravity, Urine 1.003 (*)   ? Hgb urine dipstick SMALL (*)   ? Leukocytes,Ua TRACE (*)   ? Bacteria, UA RARE (*)   ? All other components within normal limits  ?CBC WITH DIFFERENTIAL/PLATELET  ?PREGNANCY, URINE  ?BRAIN NATRIURETIC PEPTIDE  ?TROPONIN I (HIGH SENSITIVITY)  ?TROPONIN I (HIGH SENSITIVITY)  ? ? ?EKG ?None ? ?Radiology ?DG Chest 2 View ? ?Result Date: 09/29/2021 ?CLINICAL DATA:  shob EXAM: CHEST - 2 VIEW COMPARISON:  None. FINDINGS: The heart and mediastinal contours are within normal limits. No focal consolidation. No pulmonary edema. No pleural effusion. No pneumothorax. No acute osseous abnormality. IMPRESSION: No active cardiopulmonary disease. Electronically Signed   By: Tish Frederickson M.D.   On: 09/29/2021 22:37  ? ?CT Angio Chest PE W and/or Wo Contrast ? ?Result Date: 09/30/2021 ?CLINICAL DATA:  Shortness of breath, postpartum. EXAM: CT ANGIOGRAPHY CHEST WITH CONTRAST TECHNIQUE: Multidetector CT imaging of the chest was performed using the standard protocol during bolus administration of intravenous contrast. Multiplanar CT image reconstructions and MIPs were obtained to evaluate the vascular  anatomy. RADIATION DOSE REDUCTION: This exam was performed according to the departmental dose-optimization program which includes automated exposure control, adjustment of the mA and/or kV according to patient size and/or use of iterative reconstruction technique. CONTRAST:  35mL OMNIPAQUE IOHEXOL 350 MG/ML SOLN COMPARISON:  None. FINDINGS: Cardiovascular: No filling defects in the pulmonary arteries to suggest pulmonary emboli. Heart is normal size. Aorta is normal caliber. Mediastinum/Nodes: No mediastinal, hilar, or axillary adenopathy. Trachea and esophagus are unremarkable. Thyroid unremarkable. Lungs/Pleura: Lungs are clear. No focal airspace opacities or suspicious nodules. No effusions. Upper Abdomen: No acute findings Musculoskeletal: Chest wall soft tissues are unremarkable. No acute bony abnormality. Review of the MIP images confirms the above findings. IMPRESSION: No evidence of pulmonary  embolus. No acute cardiopulmonary disease. Electronically Signed   By: Charlett Nose M.D.   On: 09/30/2021 02:15   ? ?Procedures ?Procedures  ? ? ?Medications Ordered in ED ?Medications  ?albuterol (VENTOLIN HFA) 108 (90 Base) MCG/ACT inhaler 2 puff (has no administration in time range)  ?iohexol (OMNIPAQUE) 350 MG/ML injection 40 mL (40 mLs Intravenous Contrast Given 09/30/21 0211)  ? ? ?ED Course/ Medical Decision Making/ A&P ?  ?                        ?Medical Decision Making ?Amount and/or Complexity of Data Reviewed ?Labs: ordered. ?Radiology: ordered and independent interpretation performed. ?ECG/medicine tests: ordered and independent interpretation performed. ? ?Risk ?Prescription drug management. ? ? ?26 year old female presenting to the ED with shortness of breath.  She has been feeling this way for a few weeks.  Also started having some chest pain and feeling like she cannot take a deep breath today.  She is approximately 11 weeks postpartum, did have some preeclampsia towards end of pregnancy and was started  on Procardia.  Reports she was switched to "something else" but unsure why so has not been taking it.  She is afebrile and nontoxic in appearance on my exam.  Her vitals are stable on room air.  She is in no acute respiratory d

## 2021-09-30 NOTE — Discharge Instructions (Signed)
Cardiac tests today were all reassuring. ?I do recommend that you follow-up with your OB regarding the medications they prescribed. ?Return here for any new/acute changes. ?

## 2021-10-18 ENCOUNTER — Encounter (HOSPITAL_COMMUNITY): Payer: Self-pay

## 2021-10-18 ENCOUNTER — Emergency Department (HOSPITAL_COMMUNITY)
Admission: EM | Admit: 2021-10-18 | Discharge: 2021-10-18 | Disposition: A | Discharge status: Home / Self Care | Payer: Medicaid Other | Attending: Emergency Medicine | Admitting: Emergency Medicine

## 2021-10-18 ENCOUNTER — Other Ambulatory Visit: Payer: Self-pay

## 2021-10-18 DIAGNOSIS — F17200 Nicotine dependence, unspecified, uncomplicated: Secondary | ICD-10-CM | POA: Diagnosis not present

## 2021-10-18 DIAGNOSIS — F419 Anxiety disorder, unspecified: Secondary | ICD-10-CM | POA: Diagnosis not present

## 2021-10-18 DIAGNOSIS — I10 Essential (primary) hypertension: Secondary | ICD-10-CM | POA: Diagnosis not present

## 2021-10-18 DIAGNOSIS — N9489 Other specified conditions associated with female genital organs and menstrual cycle: Secondary | ICD-10-CM | POA: Diagnosis not present

## 2021-10-18 DIAGNOSIS — R002 Palpitations: Secondary | ICD-10-CM | POA: Diagnosis present

## 2021-10-18 LAB — CBC WITH DIFFERENTIAL/PLATELET
Abs Immature Granulocytes: 0.01 10*3/uL (ref 0.00–0.07)
Basophils Absolute: 0 10*3/uL (ref 0.0–0.1)
Basophils Relative: 1 %
Eosinophils Absolute: 0 10*3/uL (ref 0.0–0.5)
Eosinophils Relative: 1 %
HCT: 44.6 % (ref 36.0–46.0)
Hemoglobin: 14.6 g/dL (ref 12.0–15.0)
Immature Granulocytes: 0 %
Lymphocytes Relative: 22 %
Lymphs Abs: 1.3 10*3/uL (ref 0.7–4.0)
MCH: 28.6 pg (ref 26.0–34.0)
MCHC: 32.7 g/dL (ref 30.0–36.0)
MCV: 87.3 fL (ref 80.0–100.0)
Monocytes Absolute: 0.3 10*3/uL (ref 0.1–1.0)
Monocytes Relative: 6 %
Neutro Abs: 4 10*3/uL (ref 1.7–7.7)
Neutrophils Relative %: 70 %
Platelets: 311 10*3/uL (ref 150–400)
RBC: 5.11 MIL/uL (ref 3.87–5.11)
RDW: 12.3 % (ref 11.5–15.5)
WBC: 5.7 10*3/uL (ref 4.0–10.5)
nRBC: 0 % (ref 0.0–0.2)

## 2021-10-18 LAB — COMPREHENSIVE METABOLIC PANEL
ALT: 34 U/L (ref 0–44)
AST: 29 U/L (ref 15–41)
Albumin: 4.4 g/dL (ref 3.5–5.0)
Alkaline Phosphatase: 37 U/L — ABNORMAL LOW (ref 38–126)
Anion gap: 10 (ref 5–15)
BUN: 8 mg/dL (ref 6–20)
CO2: 23 mmol/L (ref 22–32)
Calcium: 9.8 mg/dL (ref 8.9–10.3)
Chloride: 105 mmol/L (ref 98–111)
Creatinine, Ser: 0.81 mg/dL (ref 0.44–1.00)
GFR, Estimated: >60 mL/min (ref >60)
Glucose, Bld: 106 mg/dL — ABNORMAL HIGH (ref 70–99)
Potassium: 4.1 mmol/L (ref 3.5–5.1)
Sodium: 138 mmol/L (ref 135–145)
Total Bilirubin: 1 mg/dL (ref 0.3–1.2)
Total Protein: 7.9 g/dL (ref 6.5–8.1)

## 2021-10-18 LAB — URINALYSIS, ROUTINE W REFLEX MICROSCOPIC
Bilirubin Urine: NEGATIVE
Glucose, UA: NEGATIVE mg/dL
Hgb urine dipstick: NEGATIVE
Ketones, ur: NEGATIVE mg/dL
Leukocytes,Ua: NEGATIVE
Nitrite: NEGATIVE
Protein, ur: NEGATIVE mg/dL
Specific Gravity, Urine: 1.02 (ref 1.005–1.030)
pH: 6 (ref 5.0–8.0)

## 2021-10-18 LAB — LIPASE, BLOOD: Lipase: 27 U/L (ref 11–51)

## 2021-10-18 LAB — I-STAT BETA HCG BLOOD, ED (MC, WL, AP ONLY): I-stat hCG, quantitative: <5 m[IU]/mL (ref <5)

## 2021-10-18 LAB — TSH: TSH: 0.77 u[IU]/mL (ref 0.350–4.500)

## 2021-10-18 LAB — D-DIMER, QUANTITATIVE: D-Dimer, Quant: <0.27 ug/mL-FEU (ref 0.00–0.50)

## 2021-10-18 MED ORDER — ACETAMINOPHEN 325 MG PO TABS
650.0000 mg | ORAL_TABLET | Freq: Once | ORAL | Status: AC
  Administered 2021-10-18: 650 mg via ORAL
  Filled 2021-10-18: qty 2

## 2021-10-18 NOTE — ED Triage Notes (Signed)
Pt states that she feel like her heart is racing and some SOB with dizziness.  ?

## 2021-10-18 NOTE — ED Notes (Signed)
EDP at [facility] 

## 2021-10-18 NOTE — Discharge Instructions (Signed)
The testing today, does not show any cardiac or pulmonary problems.  You may have anxiety causing your symptoms.  This is common and tends to be seen especially with pregnancies and first children.  Make sure you follow-up with your gynecologist for a checkup this week as scheduled.  Follow-up with your primary care doctor as well for further care and treatment.  They can help refer you to additional testing if needed.  We will give you the resource guide, that can be helpful if you want to seek out care with a counselor. ?

## 2021-10-18 NOTE — ED Notes (Signed)
Pt alert, NAD, calm, interactive, resps e/u, speaking in clear complete sentences. C/o HA, some intermittent dizziness and occ not able to breathe comfortably. Denies nausea, but vomited x1. Mentions fell last nigh 2/2 dizziness.   ?

## 2021-10-18 NOTE — ED Notes (Signed)
Pt beginning to breastfeed infant at this time. Family at [facility].   ?

## 2021-10-18 NOTE — ED Notes (Signed)
Called patient to retake vital Signs/ No answer. ?

## 2021-10-18 NOTE — ED Provider Notes (Signed)
?[facility] EMERGENCY DEPARTMENT ?Provider Note ? ? ?CSN: [id number] ?Arrival date & time: 10/18/21  0437 ? ?  ? ?History ? ?Chief Complaint  ?Patient presents with  ? Palpitations  ? ? ?Brandi [name] is a 26 y.o. female. ? ?HPI ?Patient presenting for evaluation of dizziness, and palpitations.  Initially evaluated at triage with screening testing ordered.  Patient states she feels like she cannot take a deep breath easily.  She has felt like this previously when she was evaluated, in the ED, 2 weeks ago.  She was evaluated for chest pain and shortness of breath.  She had a CT angiogram to evaluate for PE which was negative.  She is currently breast-feeding, a 80-month-old infant.  She complains of a mild headache at this time.  She is here with her female partner who is supportive and concerned.  During her recent pregnancy she was treated with nifedipine. ?  ? ?Home Medications ?Prior to Admission medications   ?Medication Sig Start Date End Date Taking? Authorizing Provider  ?NIFEdipine (ADALAT CC) 30 MG 24 hr tablet Take 1 tablet (30 mg total) by mouth daily. 08/28/21 10/18/21 Yes Bernerd Limbo, CNM  ?Prenatal Vit-Fe Fumarate-FA (PRENATAL MULTIVITAMIN) TABS tablet Take 1 tablet by mouth daily at 12 noon.   Yes [provider]  ?acetaminophen (TYLENOL) 325 MG tablet Take 2 tablets (650 mg total) by mouth every 4 (four) hours as needed (for pain scale < 4). ?Patient not taking: Reported on 08/17/2021 07/15/21   Raylene Everts, MD  ?docusate sodium (COLACE) 100 MG capsule Take 1 capsule (100 mg total) by mouth every 12 (twelve) hours. ?Patient not taking: Reported on 06/22/2021 02/14/21   Gerrit Heck, CNM  ?furosemide (LASIX) 20 MG tablet Take 1 tablet (20 mg total) by mouth daily for 3 days. ?Patient not taking: Reported on 10/18/2021 07/16/21 07/19/21  Raylene Everts, MD  ?ibuprofen (ADVIL) 600 MG tablet Take 1 tablet (600 mg total) by mouth every 6 (six) hours as  needed. ?Patient not taking: Reported on 08/17/2021 07/15/21   Raylene Everts, MD  ?penicillin v potassium (VEETID) 500 MG tablet Take 500 mg by mouth 4 (four) times daily. ?Patient not taking: Reported on 10/18/2021 08/12/21   [provider]  ?polyethylene glycol (MIRALAX) 17 g packet Take 17 g by mouth daily. ?Patient not taking: Reported on 06/22/2021 02/14/21   Gerrit Heck, CNM  ?valACYclovir (VALTREX) 500 MG tablet Take 1 tablet (500 mg total) by mouth 2 (two) times daily. ?Patient not taking: Reported on 08/17/2021 06/28/21   Bernerd Limbo, CNM  ?   ? ?Allergies    ?Patient has no known allergies.   ? ?Review of Systems   ?Review of Systems ? ?Physical Exam ?Updated Vital Signs ?BP 131/79   Pulse 91   Temp 98.3 ?F (36.8 ?C)   Resp 18   SpO2 100%  ?Physical Exam ?Vitals and nursing note reviewed.  ?Constitutional:   ?   Appearance: She is well-developed. She is not ill-appearing.  ?HENT:  ?   Head: Normocephalic and atraumatic.  ?   Right Ear: External ear normal.  ?   Left Ear: External ear normal.  ?Eyes:  ?   Conjunctiva/sclera: Conjunctivae normal.  ?   Pupils: Pupils are equal, round, and reactive to light.  ?Neck:  ?   Trachea: Phonation normal.  ?Cardiovascular:  ?   Rate and Rhythm: Normal rate and regular rhythm.  ?   Heart sounds: Normal  heart sounds.  ?Pulmonary:  ?   Effort: Pulmonary effort is normal.  ?   Breath sounds: Normal breath sounds.  ?Abdominal:  ?   General: There is no distension.  ?   Palpations: Abdomen is soft.  ?Musculoskeletal:     ?   General: Normal range of motion.  ?   Cervical back: Normal range of motion and neck supple.  ?Skin: ?   General: Skin is warm and dry.  ?Neurological:  ?   Mental Status: She is alert and oriented to person, place, and time.  ?   Cranial Nerves: No cranial nerve deficit.  ?   Sensory: No sensory deficit.  ?   Motor: No abnormal muscle tone.  ?   Coordination: Coordination normal.  ?Psychiatric:     ?   Mood and Affect: Mood normal.      ?   Behavior: Behavior normal.     ?   Thought Content: Thought content normal.     ?   Judgment: Judgment normal.  ? ? ?ED Results / Procedures / Treatments   ?Labs ?(all labs ordered are listed, but only abnormal results are displayed) ?Labs Reviewed  ?COMPREHENSIVE METABOLIC PANEL - Abnormal; Notable for the following components:  ?    Result Value  ? Glucose, Bld 106 (*)   ? Alkaline Phosphatase 37 (*)   ? All other components within normal limits  ?URINALYSIS, ROUTINE W REFLEX MICROSCOPIC - Abnormal; Notable for the following components:  ? APPearance HAZY (*)   ? All other components within normal limits  ?CBC WITH DIFFERENTIAL/PLATELET  ?LIPASE, BLOOD  ?TSH  ?D-DIMER, QUANTITATIVE  ?I-STAT BETA HCG BLOOD, ED ([facility], [facility], [facility] ONLY)  ? ? ?EKG ?EKG Interpretation ? ?Date/Time:  Tuesday October 18 2021 05:20:42 EDT ?Ventricular Rate:  83 ?PR Interval:  164 ?QRS Duration: 70 ?QT Interval:  370 ?QTC Calculation: 434 ?R Axis:   79 ?Text Interpretation: Normal sinus rhythm Nonspecific T wave abnormality Abnormal ECG When compared with ECG of 29-Sep-2021 21:54, PREVIOUS ECG IS PRESENT since last tracing no significant change Confirmed by Mancel Bale [phone]) on 10/18/2021 11:22:50 AM ? ?Radiology ?No results found. ? ?Procedures ?Procedures  ? ? ?Medications Ordered in ED ?Medications  ?acetaminophen (TYLENOL) tablet 650 mg (650 mg Oral Given 10/18/21 1228)  ? ? ?ED Course/ Medical Decision Making/ A&P ?  ?                        ?Medical Decision Making ?Patient presenting with concerns for difficulty breathing which are not specific.  She feels that she "cannot take a deep breath."  She has also felt like her heart is racing at times.  She has previously been diagnosed with anxiety but does not currently take anything for it.  She was apparently taken nifedipine during her recent pregnancy.  She is still breast-feeding.  Similar symptoms 2 and half weeks ago when she was screened for PE with CT angiogram chest.  It was  negative. ? ?Amount and/or Complexity of Data Reviewed ?Independent Historian:  ?   Details: She is a cogent historian ?External Data Reviewed: notes. ?   Details: EMR issue, duplicate registration, requires merging, prior problems include: ? ?BMI 33.0-33.9,adult ?Obesity in pregnancy ?Chronic hypertension affecting pregnancy ?Hemoglobin C trait (HCC) ?Delivery by classical cesarean section ?Chronic neck pain ?COVID-19 vaccination declined ?Tobacco dependence ?Essential hypertension ?Empty sella turcica (HCC) ?Hypertensive retinopathy of both eyes ?Major depressive disorder, recurrent episode, moderate (  HCC) ?Social anxiety disorder ?Supervision of high risk pregnancy, antepartum, second trimester ?Prior pregnancy with placental abruption in second trimester, antepartum ?Hx of preeclampsia, prior pregnancy, currently pregnant, second trimester ?Labs: ordered. ?   Details: Pregnancy test, urinalysis, metabolic panel, CBC, D-dimer-normal ?ECG/medicine tests: ordered and independent interpretation performed. ?   Details: Cardiac monitor-normal sinus rhythm ? ?Risk ?OTC drugs. ?Decision regarding hospitalization. ?Risk Details: Patient presenting with nonspecific symptoms, most likely anxiety related.  She was recently evaluated for similar problems.  She is nursing her 8620-month-old child.  Pregnancy and postpartum.  Otherwise noncontributory.  No recent illnesses.  No evidence for ACS, acute pulmonary infection, metabolic disorders or pregnancy related complications.  No indication for hospitalization at this time.  Patient would likely benefit from counseling.  She is to try this route first before medication since she is currently nursing. ? ? ? ? ? ? ? ? ? ? ?Final Clinical Impression(s) / ED Diagnoses ?Final diagnoses:  ?Anxiety  ? ? ?Rx / DC Orders ?ED Discharge Orders   ? ? None  ? ?  ? ? ?  ?Mancel BaleWentz, Skylor Hughson, MD ?10/19/21 [phone] ? ?

## 2021-10-18 NOTE — ED Provider Triage Note (Signed)
Emergency Medicine Provider Triage Evaluation Note ? ?Brandi [name] , a 26 y.o. female  was evaluated in triage.  Pt complains of palpitations that began @ 22:00.  Patient reports she was at rest when she developed palpitations as if her heart was racing with associated lightheadedness, nausea, and some vomiting.  She was able to go to sleep, however she woke up with persistent symptoms prompting ED visit.  Denies recent major lifestyle changes, diet changes, or change in caffeine intake.  Denies drug use.  She is 3 months postpartum. ? ?Review of Systems  ?Positive: Palpitations, dyspnea, lightheadedness/dizziness, N/V ?Negative: Chest pain, abdominal pain, syncope, hemoptysis, leg pain/swelling.  ? ?Physical Exam  ?BP 136/87 (BP Location: Left Arm)   Pulse 75   Temp 98.3 ?F (36.8 ?C) (Oral)   Resp 17   SpO2 100%  ?Gen:   Awake, no distress   ?Resp:  Normal effort  ?MSK:   Moves extremities without difficulty  ? ?Medical Decision Making  ?Medically screening exam initiated at 5:42 AM.  Appropriate orders placed.  Brandi [name] was informed that the remainder of the evaluation will be completed by another provider, this initial triage assessment does not replace that evaluation, and the importance of remaining in the ED until their evaluation is complete. ? ?Palpitations.  ?  Cherly Anderson, PA-C ?10/18/21 3295 ? ?

## 2021-10-21 ENCOUNTER — Ambulatory Visit (INDEPENDENT_AMBULATORY_CARE_PROVIDER_SITE_OTHER): Payer: Medicaid Other

## 2021-10-21 VITALS — BP 123/86 | HR 74 | Ht 63.0 in | Wt 171.0 lb

## 2021-10-21 DIAGNOSIS — I10 Essential (primary) hypertension: Secondary | ICD-10-CM

## 2021-10-21 NOTE — Progress Notes (Signed)
Patient not seen by provider as nurse reports patient with concerns r/t CHTN. Not currently pregnant, per patient, and bp normal.  Nurse instructed to give list of PCP providers.  ?

## 2021-11-08 ENCOUNTER — Encounter (HOSPITAL_BASED_OUTPATIENT_CLINIC_OR_DEPARTMENT_OTHER): Payer: Self-pay | Admitting: Nurse Practitioner

## 2021-11-08 ENCOUNTER — Ambulatory Visit (INDEPENDENT_AMBULATORY_CARE_PROVIDER_SITE_OTHER): Payer: Medicaid Other | Admitting: Nurse Practitioner

## 2021-11-08 VITALS — BP 122/82 | HR 88 | Ht 63.0 in | Wt 172.0 lb

## 2021-11-08 DIAGNOSIS — F410 Panic disorder [episodic paroxysmal anxiety] without agoraphobia: Secondary | ICD-10-CM | POA: Diagnosis not present

## 2021-11-08 DIAGNOSIS — M6208 Separation of muscle (nontraumatic), other site: Secondary | ICD-10-CM

## 2021-11-08 DIAGNOSIS — F5101 Primary insomnia: Secondary | ICD-10-CM

## 2021-11-08 DIAGNOSIS — Z3009 Encounter for other general counseling and advice on contraception: Secondary | ICD-10-CM | POA: Diagnosis not present

## 2021-11-08 NOTE — Progress Notes (Signed)
?Tollie Eth, DNP, AGNP-c ?Primary Care & Sports Medicine ?[address]  [address] ?[place], [place] [address] ?[phone] ? ?New patient visit ? ? ?Patient: Brandi [name]   DOB: 08/28/1995   25 y.o. Female  MRN: [id number] ?Visit Date: 11/08/2021 ? ?Patient Care Team: ?Chanler Mendonca, Sung Amabile, NP as PCP - General (Nurse Practitioner) ? ?Today's Vitals  ? 11/08/21 1612  ?BP: 122/82  ?Pulse: 88  ?SpO2: 90%  ?Weight: 172 lb (78 kg)  ?Height: 5\' 3"  (1.6 m)  ? ?Body mass index is 30.47 kg/m?.  ? ?Today's healthcare provider: , NP  ? ?Chief Complaint  ?Patient presents with  ? Establish Care  ? Contraception  ? Forms  ? Anxiety  ? Umbilical lump  ? ?Subjective  ?  ?Brandi [name] is a 26 y.o. female who presents today as a new patient to establish care.  ?  ?Patient endorses the following concerns presently: ?Anxiety ?"30" endorses today concerns with increased anxiety since the birth of her child who is now 37 months old ?She tells me that she "cannot shut off" her mind ?Her concerns are typically about the baby ?She tells me that she constantly wakes up to check on the baby ?Most of her focus is on the babies sleeping and sleeping abilities ?She reports that prior to having the baby she did have some concerns with anxiety and depression symptoms and she has been acutely aware to monitor for these ?She reports in the past 2 to 3 weeks she has had 2 panic attacks she describes these as episodes of dizziness, chills, palpitations, shortness of breath, headache, and vomiting.  These episodes are typically on Sunday evening when her husband is preparing to return to work Monday morning or when she is rocking the baby to sleep. ?She is currently seeing a counselor and has been working on journaling, being outside more, meditation, mindfulness however these have not been effective to control her symptoms completely ?She has recently applied for employment which she is looking forward  to.  She tells me right now that the most social interaction she has is with the child and her spouse in the evenings. ?She tells me that her spouse is very supportive and has offered to keep the baby in the evening so that she can go out however she is fearful to do this. ?She reports that she prefers all natural treatment options and is hesitant to consider any medication at this time.  She would like to know what herbal supplements may be appropriate to take while breast-feeding ?She denies SI/HI at this time.  She feels that the majority of her symptoms are anxiety however she does feel that there are some depressive symptoms coming through due to the anxiety. ?She would like a referral for a new therapist today as her current therapist is working only virtually and she will not be able to continue with this ? ?Contraception ?She is not currently on any form of contraception at this time ?She is concerned that her menstrual cycles have been fairly irregular the last few months since starting back after having a baby ?She tells me that she will have what she feels is a normal cycle and then breakthrough bleeding approximately 2 weeks later ?She tells me that none of her bleeding has been significant enough to fill a complete pad but it is more than would fill a panty liner ?She has been sexually active with her spouse but only a couple  of times ?She expresses concern over returning to routine sexual activity ?She reports there is a sensation of tenderness at the initiation of intercourse however this improves during intercourse ?She has not had any bleeding after intercourse or significant pain ?She would like contraceptive management with something that is nonhormonal.  She is not interested in an IUD at this time. ? ?Umbilical lump ?She endorses a approximate 1 cm lump in the umbilical region that appears with intrathoracic pressure.  She does have a history of hernia repair and she is not sure if this is the  same location. ?She reports this recently showed up after having the baby. ?She denies any changes in bowel habits or pain associated with this ? ? ?History reviewed and reveals the following: ?Past Medical History:  ?Diagnosis Date  ? Anxiety   ? COVID-19 virus infection 07/13/2021  ? Depression   ? HSV-1 infection   ? Onychomycosis 12/16/2020  ? Preeclampsia 07/13/2021  ? PTSD (post-traumatic stress disorder)   ? Tinea corporis 01/11/2021  ? UTI (urinary tract infection)   ? ?Past Surgical History:  ?Procedure Laterality Date  ? HERNIA REPAIR    ? ?Family Status  ?Relation Name Status  ? Mother  Alive  ? Father  Alive  ? Sister  (Not Specified)  ? MGM  (Not Specified)  ? MGF  (Not Specified)  ? PGM  (Not Specified)  ? ?Family History  ?Problem Relation Age of Onset  ? Heart disease Mother   ? Stroke Mother   ? Hypertension Mother   ? Bipolar disorder Mother   ? Healthy Father   ? Anxiety disorder Sister   ? Depression Sister   ? Hypertension Sister   ? Cancer Maternal Grandmother   ? Hypertension Maternal Grandmother   ? Cancer Maternal Grandfather   ? Stroke Maternal Grandfather   ? Depression Paternal Grandmother   ? Hypertension Paternal Grandmother   ? ?Social History  ? ?Socioeconomic History  ? Marital status: Married  ?  Spouse name: Not on file  ? Number of children: Not on file  ? Years of education: Not on file  ? Highest education level: Associate degree: academic program  ?Occupational History  ? Occupation: unemployed  ?Tobacco Use  ? Smoking status: Former  ?  Packs/day: 0.50  ?  Types: Cigarettes  ?  Passive exposure: Never  ? Smokeless tobacco: Never  ?Vaping Use  ? Vaping Use: Never used  ?Substance and Sexual Activity  ? Alcohol use: Not Currently  ?  Comment: occasional  ? Drug use: Never  ? Sexual activity: Yes  ?  Birth control/protection: None  ?Other Topics Concern  ? Not on file  ?Social History Narrative  ? Not on file  ? ?Social Determinants of Health  ? ?Financial Resource Strain: Not on  file  ?Food Insecurity: Not on file  ?Transportation Needs: Not on file  ?Physical Activity: Not on file  ?Stress: Not on file  ?Social Connections: Not on file  ? ?Outpatient Medications Prior to Visit  ?Medication Sig  ? ibuprofen (ADVIL) 600 MG tablet Take 1 tablet (600 mg total) by mouth every 6 (six) hours as needed.  ? NIFEdipine (ADALAT CC) 30 MG 24 hr tablet Take 1 tablet (30 mg total) by mouth daily.  ? [DISCONTINUED] acetaminophen (TYLENOL) 325 MG tablet Take 2 tablets (650 mg total) by mouth every 4 (four) hours as needed (for pain scale < 4). (Patient not taking: Reported on 08/17/2021)  ? [DISCONTINUED]  docusate sodium (COLACE) 100 MG capsule Take 1 capsule (100 mg total) by mouth every 12 (twelve) hours. (Patient not taking: Reported on 06/22/2021)  ? [DISCONTINUED] furosemide (LASIX) 20 MG tablet Take 1 tablet (20 mg total) by mouth daily for 3 days. (Patient not taking: Reported on 10/18/2021)  ? [DISCONTINUED] penicillin v potassium (VEETID) 500 MG tablet Take 500 mg by mouth 4 (four) times daily. (Patient not taking: Reported on 10/18/2021)  ? [DISCONTINUED] polyethylene glycol (MIRALAX) 17 g packet Take 17 g by mouth daily. (Patient not taking: Reported on 06/22/2021)  ? [DISCONTINUED] Prenatal Vit-Fe Fumarate-FA (PRENATAL MULTIVITAMIN) TABS tablet Take 1 tablet by mouth daily at 12 noon.  ? [DISCONTINUED] valACYclovir (VALTREX) 500 MG tablet Take 1 tablet (500 mg total) by mouth 2 (two) times daily. (Patient not taking: Reported on 08/17/2021)  ? ?No facility-administered medications prior to visit.  ? ?No Known Allergies ?Immunization History  ?Administered Date(s) Administered  ? Tdap 06/22/2021  ? ? ?Review of Systems ?All review of systems negative except what is listed in the HPI ? ? Objective  ?  ?BP 122/82   Pulse 88   Ht 5\' 3"  (1.6 m)   Wt 172 lb (78 kg)   SpO2 90%   BMI 30.47 kg/m?  ?Physical Exam ?Vitals and nursing note reviewed.  ?Constitutional:   ?   General: She is not in acute  distress. ?   Appearance: Normal appearance.  ?Eyes:  ?   Extraocular Movements: Extraocular movements intact.  ?   Conjunctiva/sclera: Conjunctivae normal.  ?   Pupils: Pupils are equal, round, and reactive t

## 2021-11-08 NOTE — Patient Instructions (Signed)
Thank you for choosing [facility] for your Primary Care needs. I am excited for the opportunity to partner with you to meet your health care goals. It was a pleasure meeting you today! ? ?Recommendations from today's visit: ?We can see what your labs show and make any changes based on that.  ?I have put information on here about the medication, and anxiety.  ?Let's try phexxi to see if you like this for contraception ?You can try a light therapy light to help with the mood ? ?Information on diet, exercise, and health maintenance recommendations are listed below. This is information to help you be sure you are on track for optimal health and monitoring.  ? ?Please look over this and let us know if you have any questions or if you have completed any of the health maintenance outside of [place] so that we can be sure your records are up to date.  ?___________________________________________________________ ?About Me: ?I am an Adult-Geriatric Nurse Practitioner with a background in caring for patients for more than 20 years with a strong intensive care background. I provide primary care and sports medicine services to patients age 74 and older within this office. My education had a strong focus on caring for the older adult population, which I am passionate about. I am also the director of the APP Fellowship with [facility].  ? ?My desire is to provide you with the best service through preventive medicine and supportive care. I consider you a part of the medical team and value your input. I work diligently to ensure that you are heard and your needs are met in a safe and effective manner. I want you to feel comfortable with me as your provider and want you to know that your health concerns are important to me. ? ?For your information, our office hours are: ?Monday, Tuesday, and Thursday 8:00 AM - 5:00 PM ?Wednesday and Friday 8:00 AM - 12:00 PM.  ? ?In my time away from the  office I am teaching new APP's within the system and am unavailable, but my partner, Dr. Burnard Bunting is in the office for emergent needs.  ? ?If you have questions or concerns, please call our office at [phone] or send [place] a MyChart message and we will respond as quickly as possible.  ?____________________________________________________________ ?MyChart:  ?For all urgent or time sensitive needs we ask that you please call the office to avoid delays. Our number is [phone]. ?MyChart is not constantly monitored and due to the large volume of messages a day, replies may take up to 72 business hours. ? ?MyChart Policy: ?MyChart allows for you to see your visit notes, after visit summary, provider recommendations, lab and tests results, make an appointment, request refills, and contact your provider or the office for non-urgent questions or concerns. Providers are seeing patients during normal business hours and do not have built in time to review MyChart messages.  ?We ask that you allow a minimum of 3 business days for responses to [employer]. For this reason, please do not send urgent requests through [place]. Please call the office at [phone]. ?New and ongoing conditions may require a visit. We have virtual and in person visit available for your convenience.  ?Complex MyChart concerns may require a visit. Your provider may request you schedule a virtual or in person visit to ensure we are providing the best care possible. ?MyChart messages sent after 11:00 AM on Friday will not be received  by the provider until Monday morning.  ?  ?Lab and Test Results: ?You will receive your lab and test results on MyChart as soon as they are completed and results have been sent by the lab or testing facility. Due to this service, you will receive your results BEFORE your provider.  ?I review lab and tests results each morning prior to seeing patients. Some results require collaboration with other providers to  ensure you are receiving the most appropriate care. For this reason, we ask that you please allow a minimum of 3-5 business days from the time the ALL results have been received for your provider to receive and review lab and test results and contact you about these.  ?Most lab and test result comments from the provider will be sent through [place]. Your provider may recommend changes to the plan of care, follow-up visits, repeat testing, ask questions, or request an office visit to discuss these results. You may reply directly to this message or call the office at [phone] to provide information for the provider or set up an appointment. ?In some instances, you will be called with test results and recommendations. Please let us know if this is preferred and we will make note of this in your chart to provide this for you.    ?If you have not heard a response to your lab or test results in 5 business days from all results returning to [place], please call the office to let us know. We ask that you please avoid calling prior to this time unless there is an emergent concern. Due to high call volumes, this can delay the resulting process. ? ?After Hours: ?For all non-emergency after hours needs, please call the office at [phone] and select the option to reach the on-call provider service. On-call services are shared between multiple [facility] offices and therefore it will not be possible to speak directly with your provider. On-call providers may provide medical advice and recommendations, but are unable to provide refills for maintenance medications.  ?For all emergency or urgent medical needs after normal business hours, we recommend that you seek care at the closest Urgent Care or Emergency Department to ensure appropriate treatment in a timely manner.  ?[facility] has a 24 hour emergency room located on the ground floor for your convenience.  ? ?Urgent Concerns During the Business  Day ?Providers are seeing patients from 8AM to [place] with a busy schedule and are most often not able to respond to non-urgent calls until the end of the day or the next business day. ?If you should have URGENT concerns during the day, please call and speak to the nurse or schedule a same day appointment so that we can address your concern without delay.  ? ?Thank you, again, for choosing me as your health care partner. I appreciate your trust and look forward to learning more about you.  ? ?[name], DNP, AGNP-c ?___________________________________________________________ ? ?Health Maintenance Recommendations ?Screening Testing ?Mammogram ?Every 1 -2 years based on history and risk factors ?Starting at age 65 ?Pap Smear ?Ages 21-39 every 3 years ?Ages 49-65 every 5 years with HPV testing ?More frequent testing may be required based on results and history ?Colon Cancer Screening ?Every 1-10 years based on test performed, risk factors, and history ?Starting at age 74 ?Bone Density Screening ?Every 2-10 years based on history ?Starting at age 6 for women ?Recommendations for men differ based on medication usage, history, and risk factors ?AAA Screening ?  One time ultrasound ?Men 69-61 years old who have every smoked ?Lung Cancer Screening ?Low Dose Lung CT every 12 months ?Age 60-80 years with a 30 pack-year smoking history who still smoke or who have quit within the last 15 years ? ?Screening Labs ?Routine  Labs: Complete Blood Count (CBC), Complete Metabolic Panel (CMP), Cholesterol (Lipid Panel) ?Every 6-12 months based on history and medications ?May be recommended more frequently based on current conditions or previous results ?Hemoglobin A1c Lab ?Every 3-12 months based on history and previous results ?Starting at age 68 or earlier with diagnosis of diabetes, high cholesterol, BMI >26, and/or risk factors ?Frequent monitoring for patients with diabetes to ensure blood sugar control ?Thyroid Panel (TSH w/ T3  & T4) ?Every 6 months based on history, symptoms, and risk factors ?May be repeated more often if on medication ?HIV ?One time testing for all patients 33 and older ?May be repeated more frequently for patients

## 2021-11-09 ENCOUNTER — Ambulatory Visit (HOSPITAL_BASED_OUTPATIENT_CLINIC_OR_DEPARTMENT_OTHER): Payer: Medicaid Other

## 2021-11-14 ENCOUNTER — Other Ambulatory Visit (HOSPITAL_BASED_OUTPATIENT_CLINIC_OR_DEPARTMENT_OTHER): Payer: Self-pay | Admitting: Nurse Practitioner

## 2021-11-14 ENCOUNTER — Encounter (HOSPITAL_BASED_OUTPATIENT_CLINIC_OR_DEPARTMENT_OTHER): Payer: Self-pay | Admitting: Nurse Practitioner

## 2021-11-14 ENCOUNTER — Telehealth (HOSPITAL_BASED_OUTPATIENT_CLINIC_OR_DEPARTMENT_OTHER): Payer: Self-pay | Admitting: Nurse Practitioner

## 2021-11-14 ENCOUNTER — Telehealth (HOSPITAL_BASED_OUTPATIENT_CLINIC_OR_DEPARTMENT_OTHER): Payer: Self-pay

## 2021-11-14 DIAGNOSIS — E559 Vitamin D deficiency, unspecified: Secondary | ICD-10-CM

## 2021-11-14 LAB — IRON,TIBC AND FERRITIN PANEL
Ferritin: 51 ng/mL (ref 15–150)
Iron Saturation: 30 % (ref 15–55)
Iron: 90 ug/dL (ref 27–159)
Total Iron Binding Capacity: 302 ug/dL (ref 250–450)
UIBC: 212 ug/dL (ref 131–425)

## 2021-11-14 LAB — B12 AND FOLATE PANEL
Folate: 13.4 ng/mL (ref >3.0)
Vitamin B-12: 729 pg/mL (ref 232–1245)

## 2021-11-14 LAB — COMPREHENSIVE METABOLIC PANEL
ALT: 22 IU/L (ref 0–32)
AST: 20 IU/L (ref 0–40)
Albumin/Globulin Ratio: 2 (ref 1.2–2.2)
Albumin: 4.7 g/dL (ref 3.9–5.0)
Alkaline Phosphatase: 48 IU/L (ref 44–121)
BUN/Creatinine Ratio: 13 (ref 9–23)
BUN: 12 mg/dL (ref 6–20)
Bilirubin Total: 1.1 mg/dL (ref 0.0–1.2)
CO2: 21 mmol/L (ref 20–29)
Calcium: 9.9 mg/dL (ref 8.7–10.2)
Chloride: 101 mmol/L (ref 96–106)
Creatinine, Ser: 0.93 mg/dL (ref 0.57–1.00)
Globulin, Total: 2.3 g/dL (ref 1.5–4.5)
Glucose: 98 mg/dL (ref 70–99)
Potassium: 4.3 mmol/L (ref 3.5–5.2)
Sodium: 138 mmol/L (ref 134–144)
Total Protein: 7 g/dL (ref 6.0–8.5)
eGFR: 87 mL/min/{1.73_m2} (ref >59)

## 2021-11-14 LAB — CBC WITH DIFFERENTIAL/PLATELET
Basophils Absolute: 0 10*3/uL (ref 0.0–0.2)
Basos: 1 %
EOS (ABSOLUTE): 0.1 10*3/uL (ref 0.0–0.4)
Eos: 1 %
Hematocrit: 39.1 % (ref 34.0–46.6)
Hemoglobin: 13.6 g/dL (ref 11.1–15.9)
Immature Grans (Abs): 0 10*3/uL (ref 0.0–0.1)
Immature Granulocytes: 0 %
Lymphocytes Absolute: 1.8 10*3/uL (ref 0.7–3.1)
Lymphs: 43 %
MCH: 29.2 pg (ref 26.6–33.0)
MCHC: 34.8 g/dL (ref 31.5–35.7)
MCV: 84 fL (ref 79–97)
Monocytes Absolute: 0.2 10*3/uL (ref 0.1–0.9)
Monocytes: 6 %
Neutrophils Absolute: 2.1 10*3/uL (ref 1.4–7.0)
Neutrophils: 49 %
Platelets: 281 10*3/uL (ref 150–450)
RBC: 4.65 x10E6/uL (ref 3.77–5.28)
RDW: 12.7 % (ref 11.7–15.4)
WBC: 4.3 10*3/uL (ref 3.4–10.8)

## 2021-11-14 LAB — T4, FREE: Free T4: 1.15 ng/dL (ref 0.82–1.77)

## 2021-11-14 LAB — HEMOGLOBIN A1C
Est. average glucose Bld gHb Est-mCnc: 105 mg/dL
Hgb A1c MFr Bld: 5.3 % (ref 4.8–5.6)

## 2021-11-14 LAB — QUANTIFERON-TB GOLD PLUS
QuantiFERON Mitogen Value: >10 IU/mL
QuantiFERON Nil Value: 0 IU/mL
QuantiFERON TB1 Ag Value: 0.01 IU/mL
QuantiFERON TB2 Ag Value: 0 IU/mL
QuantiFERON-TB Gold Plus: NEGATIVE

## 2021-11-14 LAB — VITAMIN D 25 HYDROXY (VIT D DEFICIENCY, FRACTURES): Vit D, 25-Hydroxy: 18.2 ng/mL — ABNORMAL LOW (ref 30.0–100.0)

## 2021-11-14 LAB — TSH: TSH: 1.23 u[IU]/mL (ref 0.450–4.500)

## 2021-11-14 MED ORDER — VITAMIN D3 25 MCG (1000 UNIT) PO TABS: 3000.0000 [IU] | ORAL_TABLET | Freq: Every day | ORAL | 11 refills | Status: DC

## 2021-11-14 NOTE — Telephone Encounter (Signed)
Pt came in on 5/15 to pick up paperwork for work that was filled out by her PCP. ?

## 2021-11-14 NOTE — Telephone Encounter (Signed)
Patient came in office ( 11/08/2021 ) and needed paper work filled out for her new job. Health Assessment/Medical Report was completed at no charge per [place]. Patient pick up completed paperwork 11/14/2021. ?Copies was placed for [place] Dec to scan in patients chart.  ?

## 2021-11-15 DIAGNOSIS — M6208 Separation of muscle (nontraumatic), other site: Secondary | ICD-10-CM | POA: Insufficient documentation

## 2021-11-15 DIAGNOSIS — F5101 Primary insomnia: Secondary | ICD-10-CM | POA: Insufficient documentation

## 2021-11-15 DIAGNOSIS — F410 Panic disorder [episodic paroxysmal anxiety] without agoraphobia: Secondary | ICD-10-CM | POA: Insufficient documentation

## 2021-11-15 DIAGNOSIS — Z3009 Encounter for other general counseling and advice on contraception: Secondary | ICD-10-CM | POA: Insufficient documentation

## 2021-11-15 DIAGNOSIS — K439 Ventral hernia without obstruction or gangrene: Secondary | ICD-10-CM | POA: Insufficient documentation

## 2021-11-15 NOTE — Assessment & Plan Note (Signed)
Discussion of non-hormonal contraceptive options with patient today. She does not wish for IUD at this time. Discussed option of Phexxi for contraceptive management. She would like to try this. Samples provided today for patient and she will notify if this is an option she would like to continue.  ?

## 2021-11-15 NOTE — Assessment & Plan Note (Signed)
>>  ASSESSMENT AND PLAN FOR DIASTASIS OF RECTUS ABDOMINIS WRITTEN ON 11/15/2021  1:04 PM BY Maeven Mcdougall E, NP  Symptoms and presentation consistent with diastasis rectus abdominus following recent childbirth. Discussed with patient that this separation is often related to the increased size of the abdomen in pregnancy and may begin to come together with abdominal strengthening exercises and time.  No herniation present at this time.  Patient will work on abdominal stretches and monitor for new symptoms such as protrusion, changes in bowel habits, pain, swelling, or redness and notify immediately.

## 2021-11-15 NOTE — Assessment & Plan Note (Signed)
Symptoms and presentation consistent with diastasis rectus abdominus following recent childbirth. Discussed with patient that this separation is often related to the increased size of the abdomen in pregnancy and may begin to come together with abdominal strengthening exercises and time.  ?No herniation present at this time.  ?Patient will work on abdominal stretches and monitor for new symptoms such as protrusion, changes in bowel habits, pain, swelling, or redness and notify immediately.  ?

## 2021-11-15 NOTE — Assessment & Plan Note (Signed)
Symptoms and presentation consistent with panic and anxiety disorder during the postpartum period. Symptoms of anxiety mainly focused around the baby and the care needed as well as concerns for the baby's sleep. Discussed that it is important to allow the baby to rest as often as needed and to rest when the baby is resting. Recommend time away from home and the baby to allow her to interact with other adults, even if just an hour or two one night a week.  ?Recommend setting habits for bedtime and avoiding constant monitoring of the babys activities. If the baby is awake, but content and safe, it is ok to let them stay where they are .  ?Discussed medication options that may be safe during breast feeding. She respectfully declines at this time, but is interested in counseling. Will send referral today.  ?No alarm sx present. Encouraged patient to reach out if she feels her symptoms are worsening or if new symptoms develop.  ?

## 2021-11-15 NOTE — Assessment & Plan Note (Signed)
Difficulty sleeping related to increased anxiety with new baby. Patient encouraged to turn off video monitor at bedtime and only use sound as prompting. Encourage resting with the baby rests and allowing herself time to get out of the house without the baby and do enjoyable things with friends. Recommend warm decaffeinated tea and trial of melatonin to see if this is helpful for sleep. I do feel that once her anxiety improves her sleep will follow.  ?

## 2021-11-30 ENCOUNTER — Other Ambulatory Visit (HOSPITAL_BASED_OUTPATIENT_CLINIC_OR_DEPARTMENT_OTHER): Payer: Self-pay

## 2021-11-30 DIAGNOSIS — E559 Vitamin D deficiency, unspecified: Secondary | ICD-10-CM

## 2021-11-30 MED ORDER — VITAMIN D3 25 MCG (1000 UNIT) PO TABS: 3000.0000 [IU] | ORAL_TABLET | Freq: Every day | ORAL | 11 refills | Status: DC

## 2021-12-07 ENCOUNTER — Encounter (HOSPITAL_BASED_OUTPATIENT_CLINIC_OR_DEPARTMENT_OTHER): Payer: Self-pay | Admitting: Nurse Practitioner

## 2021-12-09 ENCOUNTER — Ambulatory Visit (INDEPENDENT_AMBULATORY_CARE_PROVIDER_SITE_OTHER): Payer: Medicaid Other | Admitting: Nurse Practitioner

## 2021-12-09 ENCOUNTER — Encounter (HOSPITAL_BASED_OUTPATIENT_CLINIC_OR_DEPARTMENT_OTHER): Payer: Self-pay | Admitting: Nurse Practitioner

## 2021-12-09 ENCOUNTER — Other Ambulatory Visit (HOSPITAL_BASED_OUTPATIENT_CLINIC_OR_DEPARTMENT_OTHER): Payer: Self-pay

## 2021-12-09 VITALS — BP 128/88 | HR 82 | Ht 63.0 in | Wt 179.0 lb

## 2021-12-09 DIAGNOSIS — E559 Vitamin D deficiency, unspecified: Secondary | ICD-10-CM | POA: Diagnosis not present

## 2021-12-09 DIAGNOSIS — M6208 Separation of muscle (nontraumatic), other site: Secondary | ICD-10-CM

## 2021-12-09 DIAGNOSIS — K439 Ventral hernia without obstruction or gangrene: Secondary | ICD-10-CM

## 2021-12-09 MED ORDER — CHOLECALCIFEROL 25 MCG (1000 UT) PO TABS
1000.0000 [IU] | ORAL_TABLET | Freq: Every day | ORAL | 0 refills | Status: DC
  Filled 2021-12-09: qty 100, 100d supply, fill #0

## 2021-12-09 MED ORDER — ABDOMINAL BINDER/ELASTIC LARGE MISC: 0 refills | Status: DC

## 2021-12-09 NOTE — Patient Instructions (Signed)
I have sent the referral for the Surgeon consultation. They will be able to evaluate you and let you know if surgery is needed at this time or if it can wait longer.   Please let me know if you have any questions or concerns.

## 2021-12-09 NOTE — Progress Notes (Unsigned)
  [name], DNP, AGNP-c [address], [place] [address] [phone] Office [phone] Fax  ESTABLISHED PATIENT- Chronic Health and/or Follow-Up Visit  currently breastfeeding.  No chief complaint on file.   HPI  Brandi [name]  is a 26 y.o. year old female presenting today for evaluation and management of the following:   ROS All ROS negative with exception of what is listed in HPI  PHYSICAL EXAM Physical Exam  ASSESSMENT & PLAN Problem List Items Addressed This Visit   None    FOLLOW-UP No follow-ups on file.  Time: ***minutes, >50% spent counseling, care coordination, chart review, and documentation.   [name], DNP, AGNP-c

## 2021-12-13 DIAGNOSIS — K439 Ventral hernia without obstruction or gangrene: Secondary | ICD-10-CM | POA: Insufficient documentation

## 2021-12-13 NOTE — Assessment & Plan Note (Signed)
>>  ASSESSMENT AND PLAN FOR DIASTASIS OF RECTUS ABDOMINIS WRITTEN ON 12/13/2021  9:31 AM BY Shamaine Mulkern E, NP  Symptoms and presentation consistent with worsening diastases rectus abdominis following recent childbirth.  There is presence of herniation just superior to the umbilicus at the midline at this time.  Given the worsening of symptoms I do feel that evaluation with general surgery is appropriate at this time to determine if surgical intervention is required.  Discussed my recommendations with the patient.  Also discussed that she may consider an abdominal binder to help prevent further protrusion while she awaits the appointment.  She is aware of alarm symptoms that would require immediate evaluation including changes in bowel habits, abdominal pain, swelling, redness or warmth, nausea.  We will send referral today.

## 2021-12-13 NOTE — Assessment & Plan Note (Signed)
Symptoms and presentation consistent with worsening diastases rectus abdominis following recent childbirth.  There is presence of herniation just superior to the umbilicus at the midline at this time.  Given the worsening of symptoms I do feel that evaluation with general surgery is appropriate at this time to determine if surgical intervention is required.  Discussed my recommendations with the patient.  Also discussed that she may consider an abdominal binder to help prevent further protrusion while she awaits the appointment.  She is aware of alarm symptoms that would require immediate evaluation including changes in bowel habits, abdominal pain, swelling, redness or warmth, nausea.  We will send referral today.

## 2022-03-30 HISTORY — PX: HERNIA REPAIR: SHX51

## 2022-04-28 ENCOUNTER — Ambulatory Visit (HOSPITAL_BASED_OUTPATIENT_CLINIC_OR_DEPARTMENT_OTHER): Payer: Medicaid Other | Admitting: Nurse Practitioner

## 2022-04-28 ENCOUNTER — Encounter (HOSPITAL_BASED_OUTPATIENT_CLINIC_OR_DEPARTMENT_OTHER): Payer: Self-pay

## 2022-05-12 ENCOUNTER — Ambulatory Visit (INDEPENDENT_AMBULATORY_CARE_PROVIDER_SITE_OTHER): Payer: Medicaid Other | Admitting: Nurse Practitioner

## 2022-05-12 ENCOUNTER — Other Ambulatory Visit (HOSPITAL_BASED_OUTPATIENT_CLINIC_OR_DEPARTMENT_OTHER): Payer: Self-pay

## 2022-05-12 ENCOUNTER — Encounter (HOSPITAL_BASED_OUTPATIENT_CLINIC_OR_DEPARTMENT_OTHER): Payer: Self-pay | Admitting: Nurse Practitioner

## 2022-05-12 VITALS — BP 129/82 | HR 81 | Ht 63.0 in | Wt 193.4 lb

## 2022-05-12 DIAGNOSIS — R635 Abnormal weight gain: Secondary | ICD-10-CM | POA: Diagnosis not present

## 2022-05-12 DIAGNOSIS — F411 Generalized anxiety disorder: Secondary | ICD-10-CM | POA: Diagnosis not present

## 2022-05-12 DIAGNOSIS — G43819 Other migraine, intractable, without status migrainosus: Secondary | ICD-10-CM | POA: Diagnosis not present

## 2022-05-12 MED ORDER — SERTRALINE HCL 25 MG PO TABS
ORAL_TABLET | ORAL | 3 refills | Status: DC
Start: 2022-05-12 — End: 2022-09-15
  Filled 2022-05-12: qty 30, 30d supply, fill #0

## 2022-05-12 NOTE — Patient Instructions (Signed)
Start by walking 10-15 minutes a day. Then slowly increase in time and distance weekly.

## 2022-05-12 NOTE — Progress Notes (Signed)
Brandi Keeler, DNP, AGNP-c [place] [address], [place] [address] [phone] Office [phone] Fax  ESTABLISHED PATIENT- Chronic Health and/or Follow-Up Visit  Blood pressure 129/82, pulse 81, height 5\' 3"  (1.6 m), weight 193 lb 6.4 oz (87.7 kg), SpO2 100 %, currently breastfeeding.    Brandi [name] is a 26 y.o. year old female presenting today for evaluation and management of the following: Follow-up (Pt presents today for 60M follow up. Her biggest concerns are migraines. She is also concerned for her weight.)  Migraines - she tells me that she has been having more headaches recently. She tells me that prior to the headache she usually is startled or jumps up too fast. She tells me she feels like her blood pressure may be elevated when this occurs. She tells me that she does have headaches about 5 days before her period every month. With the headaches she tells me they usually last the remainder of the day and sometimes into the next day.  Description of Headaches: Location of pain: bilateral, temporal, frontal Radiation of pain?:none Character of pain:aching, pulsating, and throbbing Severity of pain: 8 Accompanying symptoms: nausea, sonophobia, photophobia, odors Prodromal sx?: none Rapidity of onset: gradual Typical duration of individual headache: several hours Are most headaches similar in presentation? yes Typical precipitants: stress or startle response  Temporal Pattern of Headaches: Started having HAs several years ago Worst time of day: no particular Awaken from sleep?: no Seasonal pattern?: no 'Clustering' of HAs over time? no Overall pattern since problem began: gradually worsening  Degree of Functional Impairment:  moderate to severe  Current Use of Meds to Treat HA: Abortive meds? acetaminophen Daily use? no Prophylactic meds? none  Additional Relevant History: History of head/neck  trauma? no History of head/neck surgery? no Family h/o headache problems? yes - father and sister Use of meds that might worsen HAs? no Exposure to carbon monoxide? no Substance use: none  She also tells me she is not getting much sleep  She tells me she has a hard time falling asleep and she struggles with anxiety. She tells mea few months ago she had an anxiety attack  All ROS negative with exception of what is listed above.   PHYSICAL EXAM Physical Exam Vitals and nursing note reviewed.  Constitutional:      General: She is not in acute distress.    Appearance: Normal appearance.  HENT:     Head: Normocephalic.  Eyes:     Extraocular Movements: Extraocular movements intact.     Conjunctiva/sclera: Conjunctivae normal.     Pupils: Pupils are equal, round, and reactive to light.  Neck:     Vascular: No carotid bruit.  Cardiovascular:     Rate and Rhythm: Normal rate and regular rhythm.     Pulses: Normal pulses.     Heart sounds: Normal heart sounds. No murmur heard. Pulmonary:     Effort: Pulmonary effort is normal.     Breath sounds: Normal breath sounds. No wheezing.  Musculoskeletal:        General: Normal range of motion.     Cervical back: Normal range of motion and neck supple.     Right lower leg: No edema.     Left lower leg: No edema.  Lymphadenopathy:     Cervical: No cervical adenopathy.  Skin:    General: Skin is warm and dry.     Capillary Refill: Capillary refill takes less than 2 seconds.  Neurological:  General: No focal deficit present.     Mental Status: She is alert and oriented to person, place, and time.  Psychiatric:        Mood and Affect: Mood normal.        Behavior: Behavior normal.        Thought Content: Thought content normal.        Judgment: Judgment normal.     PLAN Problem List Items Addressed This Visit     Generalized anxiety disorder - Primary    Increased anxiety that appears to be manifesting into a headache  syndrome. Discussion of ways to help alleviate anxiety and stress and therapeutic techniques that may be helpful. We will plan to start sertraline at a low dose and I will send referral for counseling. Will plan to monitor effectiveness of treatment closely. No alarm sx present at this time.       Relevant Medications   sertraline (ZOLOFT) 25 MG tablet   Other Relevant Orders   Ambulatory referral to Psychology   Migraine    Migraines appear to be stress related with responses to increased anxiety and stress. BP is normal today with no alarm symptoms present. There also appears to be a component of hormonal changes that trigger the episodes as they do come monthly around her menstrual cycle. Discussion of medication options that may be helpful for management. At this time we will trial sertraline to see if this is an effective preventative measure to help with both stress and migraines. If not effective, we will make changes. At this time she declines maxalt, but we can add this for abortive therapy at any time if she wishes. Will follow.       Relevant Medications   sertraline (ZOLOFT) 25 MG tablet   ibuprofen (ADVIL) 800 MG tablet   Weight gain    Patient is concerned about weight gain and difficulty losing PP weight. Recommend monitoring diet closely and manage portions as well as avoid skipping meals or eating only high carb meals. Encouraged patient to start walking at least 10 minutes a day. If these are not effective measures, we can consider other options that may be helpful in management.        Return in about 4 weeks (around 06/09/2022) for Mood F/U Virtual (30)- [place].   Brandi Clamp, DNP, AGNP-c 05/12/2022 10:19 AM

## 2022-06-02 DIAGNOSIS — G43909 Migraine, unspecified, not intractable, without status migrainosus: Secondary | ICD-10-CM | POA: Insufficient documentation

## 2022-06-02 DIAGNOSIS — F411 Generalized anxiety disorder: Secondary | ICD-10-CM | POA: Insufficient documentation

## 2022-06-02 DIAGNOSIS — R635 Abnormal weight gain: Secondary | ICD-10-CM | POA: Insufficient documentation

## 2022-06-02 NOTE — Assessment & Plan Note (Signed)
Increased anxiety that appears to be manifesting into a headache syndrome. Discussion of ways to help alleviate anxiety and stress and therapeutic techniques that may be helpful. We will plan to start sertraline at a low dose and I will send referral for counseling. Will plan to monitor effectiveness of treatment closely. No alarm sx present at this time.

## 2022-06-02 NOTE — Assessment & Plan Note (Signed)
Migraines appear to be stress related with responses to increased anxiety and stress. BP is normal today with no alarm symptoms present. There also appears to be a component of hormonal changes that trigger the episodes as they do come monthly around her menstrual cycle. Discussion of medication options that may be helpful for management. At this time we will trial sertraline to see if this is an effective preventative measure to help with both stress and migraines. If not effective, we will make changes. At this time she declines maxalt, but we can add this for abortive therapy at any time if she wishes. Will follow.

## 2022-06-02 NOTE — Assessment & Plan Note (Signed)
Patient is concerned about weight gain and difficulty losing PP weight. Recommend monitoring diet closely and manage portions as well as avoid skipping meals or eating only high carb meals. Encouraged patient to start walking at least 10 minutes a day. If these are not effective measures, we can consider other options that may be helpful in management.

## 2022-07-03 NOTE — L&D Delivery Note (Signed)
OB/GYN Faculty Practice Delivery Note  Brandi [name] is a 27 y.o. G3P1011 s/p SVD at [redacted]w[redacted]d. She was admitted for latent labor with cHTN, DFM.   ROM: 2h 56m with clear fluid GBS Status: Negative/-- (10/17 0816) Maximum Maternal Temperature: Temp (24hrs), Avg:98.1 F (36.7 C), Min:97.6 F (36.4 C), Max:98.4 F (36.9 C)   Labor Progress: Initial SVE: 2/70/-2. AROM and Cytotec required. She then progressed to complete.   Delivery Date/Time: 04/27/23 0656 Delivery: Called to room and patient was complete and pushing. Head delivered LOA. No nuchal cord present. Cord wrapped loosely around foot. Shoulder and body delivered in usual fashion. Infant with spontaneous cry, placed on mother's abdomen, dried and stimulated. Cord clamped x 2 after done pulsating (5+ minutes) by patient request, and cut by FOB. Cord blood unable to be drawn as placental/cord blood clotted after delayed cord clamping performed. Labia, perineum, and vagina inspected with 2nd degree perineal and b/l labial tears along scar tissue from prior delivery. 2nd degree repaired in the usual fashion with 2-0 vicryl. Labial were hemostatic and did not require repair. Placenta then delivered spontaneously with gentle cord traction. Fundus firm with massage and Pitocin.  Baby Weight: pending  Placenta: 3 vessel, intact. Sent to L&D Complications: None Lacerations: 2nd degree perineal, b/l labial EBL: 350 mL Anesthesia: epidural  Infant:  APGAR (1 MIN): 8  APGAR (5 MINS): 9  APGAR (10 MINS):    [name], MD [facility] Family Medicine Fellow, [facility] for [facility], [facility] 04/27/2023, 7:28 AM

## 2022-09-15 ENCOUNTER — Encounter: Payer: Self-pay | Admitting: Nurse Practitioner

## 2022-09-15 ENCOUNTER — Ambulatory Visit (INDEPENDENT_AMBULATORY_CARE_PROVIDER_SITE_OTHER): Payer: Medicaid Other | Admitting: Nurse Practitioner

## 2022-09-15 VITALS — BP 124/78 | HR 71 | Wt 199.6 lb

## 2022-09-15 DIAGNOSIS — Z3201 Encounter for pregnancy test, result positive: Secondary | ICD-10-CM

## 2022-09-15 DIAGNOSIS — R109 Unspecified abdominal pain: Secondary | ICD-10-CM | POA: Diagnosis not present

## 2022-09-15 DIAGNOSIS — E282 Polycystic ovarian syndrome: Secondary | ICD-10-CM | POA: Diagnosis not present

## 2022-09-15 DIAGNOSIS — N926 Irregular menstruation, unspecified: Secondary | ICD-10-CM

## 2022-09-15 DIAGNOSIS — M6208 Separation of muscle (nontraumatic), other site: Secondary | ICD-10-CM

## 2022-09-15 DIAGNOSIS — Z3A01 Less than 8 weeks gestation of pregnancy: Secondary | ICD-10-CM

## 2022-09-15 LAB — POCT URINE PREGNANCY: Preg Test, Ur: POSITIVE — AB

## 2022-09-15 NOTE — Progress Notes (Unsigned)
  Orma Render, DNP, AGNP-c [facility] [place] [address] [place], [place] [address] [phone]  Subjective:   Brandi [name] is a 27 y.o. female presents to day for evaluation of: Brandi [name]   PMH, Medications, and Allergies reviewed and updated in chart as appropriate.   ROS negative except for what is listed in HPI. Objective:  BP 124/78   Pulse 71   Wt 199 lb 9.6 oz (90.5 kg)   LMP 07/08/2022   BMI 35.36 kg/m  Physical Exam        Assessment & Plan:   Problem List Items Addressed This Visit   None Visit Diagnoses     Missed period    -  Primary   Relevant Orders   POCT urine pregnancy (Completed)   Abdominal pain, unspecified abdominal location             Orma Render, DNP, AGNP-c 09/15/2022  9:59 AM    History, Medications, Surgery, SDOH, and Family History reviewed and updated as appropriate.

## 2022-09-15 NOTE — Patient Instructions (Addendum)
It appears that your Estimated Due Date is May 15, 2023  I will send the referral for OB and look for Jameia to see if I can find her.   Congratulations!!

## 2022-09-16 LAB — CBC
Hematocrit: 36.9 % (ref 34.0–46.6)
Hemoglobin: 12.8 g/dL (ref 11.1–15.9)
MCH: 29.2 pg (ref 26.6–33.0)
MCHC: 34.7 g/dL (ref 31.5–35.7)
MCV: 84 fL (ref 79–97)
Platelets: 313 10*3/uL (ref 150–450)
RBC: 4.39 x10E6/uL (ref 3.77–5.28)
RDW: 12.7 % (ref 11.7–15.4)
WBC: 7.5 10*3/uL (ref 3.4–10.8)

## 2022-09-16 LAB — BETA HCG QUANT (REF LAB): hCG Quant: 35402 m[IU]/mL

## 2022-09-21 DIAGNOSIS — N926 Irregular menstruation, unspecified: Secondary | ICD-10-CM | POA: Insufficient documentation

## 2022-09-21 DIAGNOSIS — E282 Polycystic ovarian syndrome: Secondary | ICD-10-CM

## 2022-09-21 DIAGNOSIS — Z3A01 Less than 8 weeks gestation of pregnancy: Secondary | ICD-10-CM | POA: Insufficient documentation

## 2022-09-21 HISTORY — DX: Polycystic ovarian syndrome: E28.2

## 2022-09-21 NOTE — Assessment & Plan Note (Signed)
Brandi [name] has a history of PCOS with fertility issues with successful use of metformin to attain pregnancy with her first child. At this time, there does not appear to be any necessary benefit to starting metformin. We discussed the use of this medication for menstrual regulation and helping with fertility prior to pregnancy.  Plan: - I will allow for OB to determine the need for medications for blood sugar control as this is their area of speciality.

## 2022-09-21 NOTE — Assessment & Plan Note (Signed)
>>  ASSESSMENT AND PLAN FOR DIASTASIS OF RECTUS ABDOMINIS WRITTEN ON 09/21/2022  7:38 AM BY Carel Carrier E, NP  Discussion of previous diagnosis following pregnancy. We discussed that this can recurr with subsequent pregnancies due to the muscles separating with growth of the baby. At this time, I suggest she monitor.

## 2022-09-21 NOTE — Assessment & Plan Note (Signed)
Brandi [name] is in the Brandi [name] first trimester of her pregnancy, with her last menstrual period dated February 3. She experiences nausea without vomiting and has no history of spotting or bleeding during this pregnancy. Her medical history includes one miscarriage and a successful pregnancy while on metformin for PCOS treatment. She expresses concerns about her current pregnancy due to her past miscarriage. At this time she appears well nourished and stable with no alarm symptoms present. She is no longer taking nifedipine or sertraline. Her abdomen is soft and non-tender.   Plan: - Recommend start daily prenatal vitamins. - Work on maintaining a healthy diet and hydration. Please do not attempt weight loss at this time. - Normal exercise routines that do not restrict caloric intake or cause discomfort are OK. - Order lab work for hCG levels and other relevant pregnancy markers. -We will ensure close monitoring of the pregnancy and work to get you in with GYN as soon as possible.  - Please let me know if you have any concerns or questions.

## 2022-09-21 NOTE — Assessment & Plan Note (Signed)
Discussion of previous diagnosis following pregnancy. We discussed that this can recurr with subsequent pregnancies due to the muscles separating with growth of the baby. At this time, I suggest she monitor.

## 2022-09-22 ENCOUNTER — Telehealth: Payer: Self-pay | Admitting: Nurse Practitioner

## 2022-09-22 DIAGNOSIS — R1114 Bilious vomiting: Secondary | ICD-10-CM

## 2022-09-22 MED ORDER — ONDANSETRON HCL 4 MG PO TABS: 2.0000 mg | ORAL_TABLET | Freq: Three times a day (TID) | ORAL | 1 refills | Status: DC | PRN

## 2022-09-22 NOTE — Telephone Encounter (Signed)
Pt husband called and they are wondering if it is ok for her to take diclegis while she is breast feeding?

## 2022-10-13 LAB — OB RESULTS CONSOLE GC/CHLAMYDIA
Chlamydia: NEGATIVE
Neisseria Gonorrhea: NEGATIVE

## 2022-10-13 LAB — HEPATITIS C ANTIBODY: HCV Ab: NEGATIVE

## 2022-10-13 LAB — OB RESULTS CONSOLE RPR: RPR: NONREACTIVE

## 2022-10-13 LAB — OB RESULTS CONSOLE RUBELLA ANTIBODY, IGM: Rubella: IMMUNE

## 2022-10-13 LAB — OB RESULTS CONSOLE ABO/RH: RH Type: POSITIVE

## 2022-10-13 LAB — OB RESULTS CONSOLE PLATELET COUNT: Platelets: 338

## 2022-10-13 LAB — OB RESULTS CONSOLE HIV ANTIBODY (ROUTINE TESTING): HIV: NONREACTIVE

## 2022-10-13 LAB — OB RESULTS CONSOLE HGB/HCT, BLOOD
HCT: 41 (ref 29–41)
Hemoglobin: 12.7

## 2022-10-13 LAB — OB RESULTS CONSOLE ANTIBODY SCREEN: Antibody Screen: NEGATIVE

## 2022-10-13 LAB — OB RESULTS CONSOLE HEPATITIS B SURFACE ANTIGEN: Hepatitis B Surface Ag: NEGATIVE

## 2022-10-25 ENCOUNTER — Telehealth: Payer: Medicaid Other

## 2022-11-01 ENCOUNTER — Encounter: Payer: Medicaid Other | Admitting: Certified Nurse Midwife

## 2022-11-29 ENCOUNTER — Encounter: Payer: Self-pay | Admitting: Nurse Practitioner

## 2022-12-08 ENCOUNTER — Encounter: Payer: Self-pay | Admitting: Nurse Practitioner

## 2022-12-11 LAB — OB RESULTS CONSOLE PLATELET COUNT: Platelets: 284

## 2022-12-11 LAB — OB RESULTS CONSOLE HGB/HCT, BLOOD
HCT: 39 (ref 29–41)
Hemoglobin: 12.7

## 2023-01-19 ENCOUNTER — Encounter: Payer: Self-pay | Admitting: Certified Nurse Midwife

## 2023-01-19 ENCOUNTER — Other Ambulatory Visit: Payer: Self-pay

## 2023-01-19 ENCOUNTER — Inpatient Hospital Stay (HOSPITAL_COMMUNITY)
Admission: AD | Admit: 2023-01-19 | Discharge: 2023-01-19 | Disposition: A | Discharge status: Home / Self Care | Payer: Medicaid Other | Attending: Obstetrics | Admitting: Obstetrics

## 2023-01-19 DIAGNOSIS — O99352 Diseases of the nervous system complicating pregnancy, second trimester: Secondary | ICD-10-CM | POA: Diagnosis not present

## 2023-01-19 DIAGNOSIS — O26892 Other specified pregnancy related conditions, second trimester: Secondary | ICD-10-CM | POA: Diagnosis not present

## 2023-01-19 DIAGNOSIS — O0990 Supervision of high risk pregnancy, unspecified, unspecified trimester: Secondary | ICD-10-CM

## 2023-01-19 DIAGNOSIS — F411 Generalized anxiety disorder: Secondary | ICD-10-CM | POA: Diagnosis not present

## 2023-01-19 DIAGNOSIS — I10 Essential (primary) hypertension: Secondary | ICD-10-CM

## 2023-01-19 DIAGNOSIS — Z3A23 23 weeks gestation of pregnancy: Secondary | ICD-10-CM | POA: Insufficient documentation

## 2023-01-19 DIAGNOSIS — G43E09 Chronic migraine with aura, not intractable, without status migrainosus: Secondary | ICD-10-CM | POA: Insufficient documentation

## 2023-01-19 DIAGNOSIS — O99342 Other mental disorders complicating pregnancy, second trimester: Secondary | ICD-10-CM | POA: Diagnosis not present

## 2023-01-19 DIAGNOSIS — O10912 Unspecified pre-existing hypertension complicating pregnancy, second trimester: Secondary | ICD-10-CM | POA: Diagnosis not present

## 2023-01-19 DIAGNOSIS — Z3689 Encounter for other specified antenatal screening: Secondary | ICD-10-CM

## 2023-01-19 LAB — COMPREHENSIVE METABOLIC PANEL
ALT: 10 U/L (ref 0–44)
AST: 15 U/L (ref 15–41)
Albumin: 3.1 g/dL — ABNORMAL LOW (ref 3.5–5.0)
Alkaline Phosphatase: 36 U/L — ABNORMAL LOW (ref 38–126)
Anion gap: 11 (ref 5–15)
BUN: 6 mg/dL (ref 6–20)
CO2: 18 mmol/L — ABNORMAL LOW (ref 22–32)
Calcium: 9.1 mg/dL (ref 8.9–10.3)
Chloride: 105 mmol/L (ref 98–111)
Creatinine, Ser: 0.61 mg/dL (ref 0.44–1.00)
GFR, Estimated: >60 mL/min (ref >60)
Glucose, Bld: 75 mg/dL (ref 70–99)
Potassium: 3.8 mmol/L (ref 3.5–5.1)
Sodium: 134 mmol/L — ABNORMAL LOW (ref 135–145)
Total Bilirubin: 0.4 mg/dL (ref 0.3–1.2)
Total Protein: 6.6 g/dL (ref 6.5–8.1)

## 2023-01-19 LAB — WET PREP, GENITAL
Clue Cells Wet Prep HPF POC: NONE SEEN
Sperm: NONE SEEN
Trich, Wet Prep: NONE SEEN
WBC, Wet Prep HPF POC: <10 (ref <10)
Yeast Wet Prep HPF POC: NONE SEEN

## 2023-01-19 LAB — CBC
HCT: 39 % (ref 36.0–46.0)
Hemoglobin: 13 g/dL (ref 12.0–15.0)
MCH: 28.8 pg (ref 26.0–34.0)
MCHC: 33.3 g/dL (ref 30.0–36.0)
MCV: 86.3 fL (ref 80.0–100.0)
Platelets: 283 10*3/uL (ref 150–400)
RBC: 4.52 MIL/uL (ref 3.87–5.11)
RDW: 13.1 % (ref 11.5–15.5)
WBC: 11.6 10*3/uL — ABNORMAL HIGH (ref 4.0–10.5)
nRBC: 0 % (ref 0.0–0.2)

## 2023-01-19 LAB — URINALYSIS, ROUTINE W REFLEX MICROSCOPIC
Bilirubin Urine: NEGATIVE
Glucose, UA: NEGATIVE mg/dL
Hgb urine dipstick: NEGATIVE
Ketones, ur: NEGATIVE mg/dL
Nitrite: NEGATIVE
Protein, ur: NEGATIVE mg/dL
Specific Gravity, Urine: 1.023 (ref 1.005–1.030)
pH: 6 (ref 5.0–8.0)

## 2023-01-19 LAB — VITAMIN D 25 HYDROXY (VIT D DEFICIENCY, FRACTURES): Vit D, 25-Hydroxy: 13.9 ng/mL — ABNORMAL LOW (ref 30–100)

## 2023-01-19 LAB — VITAMIN B12: Vitamin B-12: 234 pg/mL (ref 180–914)

## 2023-01-19 MED ORDER — LABETALOL HCL 100 MG PO TABS: 100.0000 mg | ORAL_TABLET | Freq: Two times a day (BID) | ORAL | 3 refills | Status: DC

## 2023-01-19 MED ORDER — SERTRALINE HCL 25 MG PO TABS: 25.0000 mg | ORAL_TABLET | Freq: Every day | ORAL | 6 refills | Status: DC

## 2023-01-19 MED ORDER — EXCEDRIN MIGRAINE 250-250-65 MG PO TABS: 2.0000 | ORAL_TABLET | Freq: Three times a day (TID) | ORAL | 3 refills | Status: DC | PRN

## 2023-01-19 MED ORDER — ONDANSETRON HCL 4 MG/2ML IJ SOLN
4.0000 mg | Freq: Once | INTRAMUSCULAR | Status: AC
  Administered 2023-01-19: 4 mg via INTRAVENOUS
  Filled 2023-01-19: qty 2

## 2023-01-19 MED ORDER — VITAMIN D 125 MCG (5000 UT) PO CAPS: 1.0000 | ORAL_CAPSULE | Freq: Every day | ORAL | 11 refills | Status: DC

## 2023-01-19 MED ORDER — ASPIRIN 81 MG PO TBEC: 162.0000 mg | DELAYED_RELEASE_TABLET | Freq: Every day | ORAL | 12 refills | Status: DC

## 2023-01-19 MED ORDER — LACTATED RINGERS IV BOLUS
1000.0000 mL | Freq: Once | INTRAVENOUS | Status: AC
  Administered 2023-01-19: 1000 mL via INTRAVENOUS

## 2023-01-19 MED ORDER — ACETAMINOPHEN-CAFFEINE 500-65 MG PO TABS
2.0000 | ORAL_TABLET | Freq: Once | ORAL | Status: AC
  Administered 2023-01-19: 2 via ORAL
  Filled 2023-01-19: qty 2

## 2023-01-19 MED ORDER — MAG-OXIDE 200 MG PO TABS: 400.0000 mg | ORAL_TABLET | Freq: Every day | ORAL | 3 refills | Status: DC

## 2023-01-19 NOTE — MAU Note (Signed)
RN called lab to inquire about  Vitamin D and Vitamin B12 . Lab tech answered and informed RN that labs are delayed due to outage. Expect labs to be back within 1 hr to 1.5hrs.

## 2023-01-19 NOTE — MAU Note (Signed)
Brandi [name] is a 27 y.o. at [redacted]w[redacted]d here in MAU reporting: ongoing migraines for the last 2 months. Reports sleeping all day and this headache is effecting her daily life. Reports HA in back of head/lower neck/left side of head. Feels dull in the back and sharp in her temples.  Reports nausea and light sensitivity. Reports sometimes seeing floaters that are black.  Endorses +FM fetal movement audible  Lower abdominal cramping that comes and goes.   Onset of complaint: ongoing  Pain score: 10 earlier 6 now  Vitals:   01/19/23 1708  BP: (!) 145/95  Pulse: 81  Resp: 17  Temp: (!) 97.2 F (36.2 C)     FHT:148 Lab orders placed from triage:  UA

## 2023-01-19 NOTE — Discharge Instructions (Signed)
Liana Hastings w/ [facility] [address] [place], [place] [address] [phone] [email or web address]  

## 2023-01-19 NOTE — MAU Provider Note (Signed)
Chief Complaint:  Abdominal Pain and Headache  HPI  Brandi [name] is a 27 y.o. G3P1011 at [redacted]w[redacted]d who presents to maternity admissions reporting a migraine that has been almost constant since 11wks. It is on her left side, it wakes her from sleep every morning. Aggravated by light and movement, accompanied by nausea but does not throw up often.   She has tried 500mg  Tylenol, decaf coffee, fiorcet, increased hydration/food and rest. She has been told that headaches are normal in pregnancy and likely related to hormones.   The headache has been debilitating, affecting her ability to do much with her 28yo or fulfill household duties, etc. This has been incredibly triggering to her baseline anxiety/depression (has spiraling thoughts about how she is not doing enough with her son and then feels depressed about it). Was taking zoloft prior to pregnancy and requested to be put back on it but was told she didn't need it since her depression is situational.  Also has a history of chronic hypertension, was initially put on 100mg  labetalol BID but told to stop for a week and stay off when her BP remained normal at home and subsequent visits.   Also has ongoing lower left sided sciatica, this was present in her last pregnancy and helped by chiro care and PT. Notes lower abdominal pain that seems to correlate with back pain/movement.  No other physical complaints.  Pregnancy Course: Receives care at [facility]. Was seen at [facility] for her last pregnancy and strongly desired to return to CNM care but could not figure out where CNM was relocated. Still strongly desires previous CNM care.  Past Medical History:  Diagnosis Date   Anxiety    COVID-19 virus infection 07/13/2021   Depression    HSV-1 infection    Onychomycosis 12/16/2020   Preeclampsia 07/13/2021   PTSD (post-traumatic stress disorder)    Tinea corporis 01/11/2021   UTI (urinary tract infection)    OB History  Gravida Para  Term Preterm AB Living  3 1 1   1 1   SAB IAB Ectopic Multiple Live Births  1     0 1    # Outcome Date GA Lbr Len/2nd Weight Sex Type Anes PTL Lv  3 Current           2 Term 07/13/21 [redacted]w[redacted]d 19:17 / 00:48 8 lb 5.7 oz (3.79 kg) M Vag-Spont EPI  LIV     Birth Comments: WDL  1 SAB 08/04/19           Past Surgical History:  Procedure Laterality Date   HERNIA REPAIR     Family History  Problem Relation Age of Onset   Heart disease Mother    Stroke Mother    Hypertension Mother    Bipolar disorder Mother    Healthy Father    Anxiety disorder Sister    Depression Sister    Hypertension Sister    Cancer Maternal Grandmother    Hypertension Maternal Grandmother    Cancer Maternal Grandfather    Stroke Maternal Grandfather    Depression Paternal Grandmother    Hypertension Paternal Grandmother    Social History   Tobacco Use   Smoking status: Former    Current packs/day: 0.50    Types: Cigarettes    Passive exposure: Never   Smokeless tobacco: Never  Vaping Use   Vaping status: Never Used  Substance Use Topics   Alcohol use: Not Currently    Comment: occasional   Drug use: Never  No Known Allergies No medications prior to admission.   I have reviewed patient's Past Medical Hx, Surgical Hx, Family Hx, Social Hx, medications and allergies.   ROS  Pertinent items noted in HPI and remainder of comprehensive ROS otherwise negative.   PHYSICAL EXAM  Patient Vitals for the past 24 hrs:  BP Temp Temp src Pulse Resp  01/19/23 2015 132/73 -- -- -- --  01/19/23 1719 132/78 -- -- -- --  01/19/23 1708 (!) 145/95 (!) 97.2 F (36.2 C) Axillary 81 17   Constitutional: Well-developed, well-nourished female in mild distress, very light sensitive.  Cardiovascular: normal rate & rhythm, warm and well-perfused Respiratory: normal effort, no problems with respiration noted GI: Abd soft, non-tender, non-distended MS: Extremities nontender, no edema, normal ROM Neurologic: Alert and  oriented x 4.  GU: no CVA tenderness Pelvic: exam deferred  Fetal Tracing: reactive Baseline: 145 Variability: moderate for gestational age Accelerations: 10x10 Decelerations: occasional variable, appropriate for GA Toco: relaxed   Labs: Results for orders placed or performed during the hospital encounter of 01/19/23 (from the past 24 hour(s))  Wet prep, genital     Status: None   Collection Time: 01/19/23  5:38 PM   Specimen: Vaginal  Result Value Ref Range   Yeast Wet Prep HPF POC NONE SEEN NONE SEEN   Trich, Wet Prep NONE SEEN NONE SEEN   Clue Cells Wet Prep HPF POC NONE SEEN NONE SEEN   WBC, Wet Prep HPF POC <10 <10   Sperm NONE SEEN   CBC     Status: Abnormal   Collection Time: 01/19/23  5:38 PM  Result Value Ref Range   WBC 11.6 (H) 4.0 - 10.5 K/uL   RBC 4.52 3.87 - 5.11 MIL/uL   Hemoglobin 13.0 12.0 - 15.0 g/dL   HCT 78.4 69.6 - 29.5 %   MCV 86.3 80.0 - 100.0 fL   MCH 28.8 26.0 - 34.0 pg   MCHC 33.3 30.0 - 36.0 g/dL   RDW 28.4 13.2 - 44.0 %   Platelets 283 150 - 400 K/uL   nRBC 0.0 0.0 - 0.2 %  Comprehensive metabolic panel     Status: Abnormal   Collection Time: 01/19/23  5:38 PM  Result Value Ref Range   Sodium 134 (L) 135 - 145 mmol/L   Potassium 3.8 3.5 - 5.1 mmol/L   Chloride 105 98 - 111 mmol/L   CO2 18 (L) 22 - 32 mmol/L   Glucose, Bld 75 70 - 99 mg/dL   BUN 6 6 - 20 mg/dL   Creatinine, Ser 1.02 0.44 - 1.00 mg/dL   Calcium 9.1 8.9 - 72.5 mg/dL   Total Protein 6.6 6.5 - 8.1 g/dL   Albumin 3.1 (L) 3.5 - 5.0 g/dL   AST 15 15 - 41 U/L   ALT 10 0 - 44 U/L   Alkaline Phosphatase 36 (L) 38 - 126 U/L   Total Bilirubin 0.4 0.3 - 1.2 mg/dL   GFR, Estimated >36 >64 mL/min   Anion gap 11 5 - 15  VITAMIN D 25 Hydroxy (Vit-D Deficiency, Fractures)     Status: Abnormal   Collection Time: 01/19/23  5:38 PM  Result Value Ref Range   Vit D, 25-Hydroxy 13.90 (L) 30 - 100 ng/mL  Vitamin B12     Status: None   Collection Time: 01/19/23  5:38 PM  Result Value Ref  Range   Vitamin B-12 234 180 - 914 pg/mL  Urinalysis, Routine w reflex microscopic -Urine, Clean  Catch     Status: Abnormal   Collection Time: 01/19/23  5:47 PM  Result Value Ref Range   Color, Urine YELLOW YELLOW   APPearance HAZY (A) CLEAR   Specific Gravity, Urine 1.023 1.005 - 1.030   pH 6.0 5.0 - 8.0   Glucose, UA NEGATIVE NEGATIVE mg/dL   Hgb urine dipstick NEGATIVE NEGATIVE   Bilirubin Urine NEGATIVE NEGATIVE   Ketones, ur NEGATIVE NEGATIVE mg/dL   Protein, ur NEGATIVE NEGATIVE mg/dL   Nitrite NEGATIVE NEGATIVE   Leukocytes,Ua SMALL (A) NEGATIVE   RBC / HPF 0-5 0 - 5 RBC/hpf   WBC, UA 0-5 0 - 5 WBC/hpf   Bacteria, UA RARE (A) NONE SEEN   Squamous Epithelial / HPF 0-5 0 - 5 /HPF   Mucus PRESENT    Imaging:  No results found.  MDM & MAU COURSE  MDM: High  MAU Course: Orders Placed This Encounter  Procedures   Wet prep, genital   CBC   Comprehensive metabolic panel   VITAMIN D 25 Hydroxy (Vit-D Deficiency, Fractures)   Vitamin B12   Urinalysis, Routine w reflex microscopic -Urine, Clean Catch   Discharge patient   Meds ordered this encounter  Medications   lactated ringers bolus 1,000 mL   ondansetron (ZOFRAN) injection 4 mg   acetaminophen-caffeine (EXCEDRIN TENSION HEADACHE) 500-65 MG per tablet 2 tablet   labetalol (NORMODYNE) 100 MG tablet    Sig: Take 1 tablet (100 mg total) by mouth 2 (two) times daily.    Dispense:  60 tablet    Refill:  3   Magnesium Oxide -Mg Supplement (MAG-OXIDE) 200 MG TABS    Sig: Take 2 tablets (400 mg total) by mouth at bedtime. If that amount causes loose stools in the am, switch to 200mg  daily at bedtime.    Dispense:  60 tablet    Refill:  3   aspirin EC 81 MG tablet    Sig: Take 2 tablets (162 mg total) by mouth daily after breakfast. Swallow whole.    Dispense:  30 tablet    Refill:  12   sertraline (ZOLOFT) 25 MG tablet    Sig: Take 1 tablet (25 mg total) by mouth daily after supper.    Dispense:  30 tablet     Refill:  6   LR bolus + zofran and excedrin-tension ordered to start. Also ordered CBC, CMP, vitamin D and B12 to check for imbalances that could cause chronic headaches.  Headache completely resolved with Excedrin, labs normal.  Long discussion on management of anx/dep, headache treatment/prevention and returning to chiropractic care to help sciatica and left-sided tightness/tension. Suggested nightly magnesium, daily aspirin for preeclampsia prevention and going back on labetalol BID since it is a low dose and headaches may be related to BP. If this plan helps her feel better but she still feels anxious/depressed, can start back on low dose zoloft. Chiro info given so she can return to care.  Pt expressed agreement with the plan and overall relief. Asked to return to CNM care - pt booked for 8/7, practice RN to call for intake visit prior.   ASSESSMENT   1. Chronic migraine with aura without status migrainosus, not intractable   2. NST (non-stress test) reactive   3. [redacted] weeks gestation of pregnancy   4. Chronic hypertension   5. Generalized anxiety disorder    PLAN  Discharge home in stable condition with new plan and return precautions.     Follow-up Information  [facility] Follow up on 02/07/2023.   Specialty: Obstetrics and Gynecology Why: as scheduled for ongoing prenatal care, my nurse will be in touch to schedule your intake visit. Contact information: [address] [place] [address] [phone]                Allergies as of 01/19/2023   No Known Allergies      Medication List     TAKE these medications    aspirin EC 81 MG tablet Take 2 tablets (162 mg total) by mouth daily after breakfast. Swallow whole.   labetalol 100 MG tablet Commonly known as: NORMODYNE Take 1 tablet (100 mg total) by mouth 2 (two) times daily.   Mag-Oxide 200 MG Tabs Generic drug: Magnesium Oxide -Mg  Supplement Take 2 tablets (400 mg total) by mouth at bedtime. If that amount causes loose stools in the am, switch to 200mg  daily at bedtime.   ondansetron 4 MG tablet Commonly known as: Zofran Take 0.5-1 tablets (2-4 mg total) by mouth every 8 (eight) hours as needed for nausea or vomiting.   prenatal multivitamin Tabs tablet Take 1 tablet by mouth daily at 12 noon.   sertraline 25 MG tablet Commonly known as: Zoloft Take 1 tablet (25 mg total) by mouth daily after supper.       Edd Arbour, CNM, MSN, IBCLC Certified Nurse Midwife, [facility]

## 2023-01-22 ENCOUNTER — Inpatient Hospital Stay (HOSPITAL_COMMUNITY)
Admission: AD | Admit: 2023-01-22 | Discharge: 2023-01-22 | Disposition: A | Discharge status: Home / Self Care | Payer: Medicaid Other | Source: Home / Self Care | Attending: Obstetrics and Gynecology | Admitting: Obstetrics and Gynecology

## 2023-01-22 ENCOUNTER — Encounter (HOSPITAL_COMMUNITY): Payer: Self-pay | Admitting: Obstetrics and Gynecology

## 2023-01-22 DIAGNOSIS — Z3A24 24 weeks gestation of pregnancy: Secondary | ICD-10-CM | POA: Diagnosis not present

## 2023-01-22 DIAGNOSIS — M898X8 Other specified disorders of bone, other site: Secondary | ICD-10-CM | POA: Insufficient documentation

## 2023-01-22 DIAGNOSIS — Z87891 Personal history of nicotine dependence: Secondary | ICD-10-CM | POA: Insufficient documentation

## 2023-01-22 DIAGNOSIS — O26892 Other specified pregnancy related conditions, second trimester: Secondary | ICD-10-CM | POA: Diagnosis not present

## 2023-01-22 DIAGNOSIS — M899 Disorder of bone, unspecified: Secondary | ICD-10-CM | POA: Diagnosis not present

## 2023-01-22 DIAGNOSIS — R109 Unspecified abdominal pain: Secondary | ICD-10-CM | POA: Diagnosis present

## 2023-01-22 LAB — URINALYSIS, ROUTINE W REFLEX MICROSCOPIC
Bilirubin Urine: NEGATIVE
Glucose, UA: NEGATIVE mg/dL
Hgb urine dipstick: NEGATIVE
Ketones, ur: NEGATIVE mg/dL
Leukocytes,Ua: NEGATIVE
Nitrite: NEGATIVE
Protein, ur: 30 mg/dL — AB
Specific Gravity, Urine: 1.014 (ref 1.005–1.030)
pH: 6 (ref 5.0–8.0)

## 2023-01-22 MED ORDER — CYCLOBENZAPRINE HCL 10 MG PO TABS: 5.0000 mg | ORAL_TABLET | Freq: Three times a day (TID) | ORAL | 2 refills | Status: DC | PRN

## 2023-01-22 NOTE — MAU Provider Note (Signed)
History     CSN: [id number]  Arrival date and time: 01/22/23 1428   None     Chief Complaint  Patient presents with   Abdominal Pain   HPI This is a 27 year old G3 P1-0-1-1 at 24 weeks and 2 days who is seen for pubic bone pain that started earlier today.  Pain started all of the sudden after lifting up her son.  She did not feel a pop or any sort of injury.  Pain is on her pubic bone and radiating up into her left side.  She finds that it is difficult to bend and move.  She does have a history of sciatic pain with her first pregnancy. No contractions, discharge, etc.  OB History     Gravida  3   Para  1   Term  1   Preterm      AB  1   Living  1      SAB  1   IAB      Ectopic      Multiple  0   Live Births  1           Past Medical History:  Diagnosis Date   Anxiety    COVID-19 virus infection 07/13/2021   Depression    HSV-1 infection    Onychomycosis 12/16/2020   Preeclampsia 07/13/2021   PTSD (post-traumatic stress disorder)    Tinea corporis 01/11/2021   UTI (urinary tract infection)     Past Surgical History:  Procedure Laterality Date   HERNIA REPAIR      Family History  Problem Relation Age of Onset   Heart disease Mother    Stroke Mother    Hypertension Mother    Bipolar disorder Mother    Healthy Father    Anxiety disorder Sister    Depression Sister    Hypertension Sister    Cancer Maternal Grandmother    Hypertension Maternal Grandmother    Cancer Maternal Grandfather    Stroke Maternal Grandfather    Depression Paternal Grandmother    Hypertension Paternal Grandmother     Social History   Tobacco Use   Smoking status: Former    Current packs/day: 0.50    Types: Cigarettes    Passive exposure: Never   Smokeless tobacco: Never  Vaping Use   Vaping status: Never Used  Substance Use Topics   Alcohol use: Not Currently    Comment: occasional   Drug use: Never    Allergies: No Known Allergies  Medications Prior  to Admission  Medication Sig Dispense Refill Last Dose   aspirin EC 81 MG tablet Take 2 tablets (162 mg total) by mouth daily after breakfast. Swallow whole. 30 tablet 12 01/22/2023   Cholecalciferol (VITAMIN D) 125 MCG (5000 UT) CAPS Take 1 capsule by mouth daily after breakfast. 30 capsule 11 01/22/2023   labetalol (NORMODYNE) 100 MG tablet Take 1 tablet (100 mg total) by mouth 2 (two) times daily. 60 tablet 3 01/21/2023   aspirin-acetaminophen-caffeine (EXCEDRIN MIGRAINE) 250-250-65 MG tablet Take 2 tablets by mouth every 8 (eight) hours as needed for headache. 30 tablet 3    Magnesium Oxide -Mg Supplement (MAG-OXIDE) 200 MG TABS Take 2 tablets (400 mg total) by mouth at bedtime. If that amount causes loose stools in the am, switch to 200mg  daily at bedtime. 60 tablet 3    ondansetron (ZOFRAN) 4 MG tablet Take 0.5-1 tablets (2-4 mg total) by mouth every 8 (eight) hours as needed for nausea or  vomiting. 30 tablet 1    Prenatal Vit-Fe Fumarate-FA (PRENATAL MULTIVITAMIN) TABS tablet Take 1 tablet by mouth daily at 12 noon.      sertraline (ZOLOFT) 25 MG tablet Take 1 tablet (25 mg total) by mouth daily after supper. 30 tablet 6     Review of Systems Physical Exam   Blood pressure 119/68, pulse 93, temperature 97.9 F (36.6 C), temperature source Oral, resp. rate 16, height 5\' 3"  (1.6 m), weight 95.1 kg, last menstrual period 08/05/2022, SpO2 98%, not currently breastfeeding.  Physical Exam Vitals reviewed.  Constitutional:      Appearance: She is well-developed.  Abdominal:     Tenderness: There is no abdominal tenderness. There is no guarding or rebound.  Musculoskeletal:     Comments: Tenderness to the pubic bone.  Patient has difficulty with abduction.  Some pain with passive range of motion to the left lower extremity, especially in the extreme flexion and external rotation  Skin:    General: Skin is warm and dry.     Capillary Refill: Capillary refill takes less than 2 seconds.   Neurological:     General: No focal deficit present.     Mental Status: She is alert.  Psychiatric:        Mood and Affect: Mood normal.        Behavior: Behavior normal.     MAU Course  Procedures NST:  Baseline: 150  Variability: moderate Accelerations: none  Decelerations: none Contractions: none   MDM   Assessment and Plan   1. [redacted] weeks gestation of pregnancy   2. Pubic bone pain    Discharge to home. PT referral made Flexeril prescribed.  Brandi [name] 01/22/2023, 4:00 PM

## 2023-01-22 NOTE — MAU Note (Signed)
.  Brandi [name] is a 27 y.o. at [redacted]w[redacted]d here in MAU reporting: she was driving home today and started having lower abdominal pain that starts on her side/hip and continues to the lower abdomen. The pain is bilateral, but hurts worse on the left side. Pain described as if she pulled a muscle, but she denies any injury. The pain also feels similar to her sciatica with her last pregnancy, but is worse than that. Pain is intermittent and worse with movement. She has not taken any medications for the pain.   LMP: N/A Onset of complaint: Today Pain score: 8/10 Vitals:   01/22/23 1514  BP: 128/61  Pulse: 94  Resp: 16  Temp: 97.9 F (36.6 C)  SpO2: 99%     FHT:150 external Lab orders placed from triage:  UA

## 2023-01-24 ENCOUNTER — Telehealth: Payer: Self-pay

## 2023-01-24 DIAGNOSIS — O0990 Supervision of high risk pregnancy, unspecified, unspecified trimester: Secondary | ICD-10-CM

## 2023-01-24 NOTE — Telephone Encounter (Signed)
Called pt to notify her she will not need a new OB intake. VM left. MyChart message sent.

## 2023-01-29 DIAGNOSIS — O0990 Supervision of high risk pregnancy, unspecified, unspecified trimester: Secondary | ICD-10-CM

## 2023-02-06 DIAGNOSIS — O10919 Unspecified pre-existing hypertension complicating pregnancy, unspecified trimester: Secondary | ICD-10-CM | POA: Insufficient documentation

## 2023-02-06 DIAGNOSIS — I10 Essential (primary) hypertension: Secondary | ICD-10-CM | POA: Insufficient documentation

## 2023-02-06 NOTE — Progress Notes (Unsigned)
History:   Jalisa Zuvich is a 27 y.o. G3P1011 at [redacted]w[redacted]d by {Ob dating:14516} being seen today for her first obstetrical visit.  Her obstetrical history is significant for {ob risk factors:10154}. Patient {does/does not:19097} intend to breast feed. Pregnancy history fully reviewed.  Patient reports {sx:14538}.      HISTORY: OB History  Gravida Para Term Preterm AB Living  3 1 1  0 1 1  SAB IAB Ectopic Multiple Live Births  1 0 0 0 1    # Outcome Date GA Lbr Len/2nd Weight Sex Type Anes PTL Lv  3 Current           2 Term 07/13/21 [redacted]w[redacted]d 19:17 / 00:48 8 lb 5.7 oz (3.79 kg) M Vag-Spont EPI  LIV     Birth Comments: WDL     Name: [place],BOY [facility]     Apgar1: 9  Apgar5: 9  1 SAB 08/04/19            Last pap smear was done *** and was {Normal/Abnormal Appearance:21344::"normal"}  Past Medical History:  Diagnosis Date   Anxiety    COVID-19 virus infection 07/13/2021   Depression    HSV-1 infection    Onychomycosis 12/16/2020   Preeclampsia 07/13/2021   PTSD (post-traumatic stress disorder)    Tinea corporis 01/11/2021   UTI (urinary tract infection)    Past Surgical History:  Procedure Laterality Date   HERNIA REPAIR  07/03/2006   HERNIA REPAIR  03/30/2022   Family History  Problem Relation Age of Onset   Heart disease Mother    Stroke Mother    Hypertension Mother    Bipolar disorder Mother    Healthy Father    Anxiety disorder Sister    Depression Sister    Hypertension Sister    Hypertension Maternal Grandmother    Lung cancer Maternal Grandmother    Stroke Maternal Grandfather    Colon cancer Maternal Grandfather    Depression Paternal Grandmother    Hypertension Paternal Grandmother    Social History   Tobacco Use   Smoking status: Former    Current packs/day: 0.50    Types: Cigarettes    Passive exposure: Never   Smokeless tobacco: Never  Vaping Use   Vaping status: Never Used  Substance Use Topics   Alcohol use: Not Currently     Comment: occasional prior to pregnancy   Drug use: Never   No Known Allergies Current Outpatient Medications on File Prior to Visit  Medication Sig Dispense Refill   aspirin EC 81 MG tablet Take 2 tablets (162 mg total) by mouth daily after breakfast. Swallow whole. 30 tablet 12   aspirin-acetaminophen-caffeine (EXCEDRIN MIGRAINE) 250-250-65 MG tablet Take 2 tablets by mouth every 8 (eight) hours as needed for headache. 30 tablet 3   Butalbital-APAP-Caffeine 50-300-40 MG CAPS Take 1 capsule by mouth every 6 (six) hours as needed.     Cholecalciferol (VITAMIN D) 125 MCG (5000 UT) CAPS Take 1 capsule by mouth daily after breakfast. 30 capsule 11   cyclobenzaprine (FLEXERIL) 10 MG tablet Take 0.5-1 tablets (5-10 mg total) by mouth 3 (three) times daily as needed for muscle spasms. 30 tablet 2   DICLEGIS 10-10 MG TBEC Take 2 tablets by mouth daily.     labetalol (NORMODYNE) 100 MG tablet Take 1 tablet (100 mg total) by mouth 2 (two) times daily. 60 tablet 3   Magnesium Oxide -Mg Supplement (MAG-OXIDE) 200 MG TABS Take 2 tablets (400 mg total) by mouth at bedtime. If  that amount causes loose stools in the am, switch to 200mg  daily at bedtime. 60 tablet 3   metoCLOPramide (REGLAN) 10 MG tablet Take 10 mg by mouth 2 (two) times daily as needed.     ondansetron (ZOFRAN) 4 MG tablet Take 0.5-1 tablets (2-4 mg total) by mouth every 8 (eight) hours as needed for nausea or vomiting. 30 tablet 1   Prenatal Vit-Fe Fumarate-FA (PRENATAL MULTIVITAMIN) TABS tablet Take 1 tablet by mouth daily at 12 noon.     sertraline (ZOLOFT) 25 MG tablet Take 1 tablet (25 mg total) by mouth daily after supper. 30 tablet 6   No current facility-administered medications on file prior to visit.    Review of Systems Pertinent items noted in HPI and remainder of comprehensive ROS otherwise negative. Physical Exam:  There were no vitals filed for this visit.    Constitutional: Well-developed, well-nourished pregnant female  in no acute distress.  HEENT: PERRLA Skin: normal color and turgor, no rash Cardiovascular: normal rate & rhythm, warm and well perfused Respiratory: normal effort, no problems with respiration noted GI: Abd soft, non-distended MS: Extremities nontender, no edema, normal ROM Neurologic: Alert and oriented x 4.  GU: no CVA tenderness Pelvic: NEFG, physiologic discharge, no blood, cervix clean. ***Pap/swabs collected ***Exam deferred  Assessment:    Pregnancy: G3P1011 Patient Active Problem List   Diagnosis Date Noted   Chronic hypertension affecting pregnancy 02/06/2023   Supervision of high risk pregnancy, antepartum 01/19/2023   PCOS (polycystic ovarian syndrome) 09/21/2022   Generalized anxiety disorder 06/02/2022   Migraine 06/02/2022   Hernia of anterior abdominal wall 12/13/2021   Diastasis of rectus abdominis 11/15/2021     Plan:    1. Supervision of high risk pregnancy in second trimester ***  2. [redacted] weeks gestation of pregnancy ***  3. Migraine with aura and without status migrainosus, not intractable ***  4. Generalized anxiety disorder ***  5. Chronic hypertension affecting pregnancy ***  6. Initial obstetric visit in second trimester ***   Initial labs drawn. Continue prenatal vitamins. Problem list reviewed and updated. Genetic Screening discussed, {Blank multiple:19196::"First trimester screen","Quad screen","NIPS"}: {requests/ordered/declines:14581}. Ultrasound discussed; fetal anatomic survey: {requests/ordered/declines:14581}. Anticipatory guidance about prenatal visits given including labs, ultrasounds, and testing. Discussed usage of Babyscripts and virtual visits as additional source of managing and completing prenatal visits in midst of coronavirus and pandemic.   Encouraged to complete MyChart Registration for her ability to review results, send requests, and have questions addressed.  The nature of [facility]  Healthcare/Faculty Practice with multiple MDs and Advanced Practice Providers was explained to patient; also emphasized that residents, students are part of our team. Routine obstetric precautions reviewed. Encouraged to seek out care at office or emergency room [facility] preferred) for urgent and/or emergent concerns. No follow-ups on file.    Future Appointments  Date Time Provider Department Center  02/07/2023  2:55 PM Bernerd Limbo, CNM [facility]    Edd Arbour, MSN, CNM, IBCLC Certified Nurse Midwife, [facility]

## 2023-02-07 ENCOUNTER — Other Ambulatory Visit: Payer: Self-pay

## 2023-02-07 ENCOUNTER — Ambulatory Visit (INDEPENDENT_AMBULATORY_CARE_PROVIDER_SITE_OTHER): Payer: Medicaid Other | Admitting: Certified Nurse Midwife

## 2023-02-07 VITALS — BP 119/79 | HR 82 | Wt 212.2 lb

## 2023-02-07 DIAGNOSIS — O10919 Unspecified pre-existing hypertension complicating pregnancy, unspecified trimester: Secondary | ICD-10-CM

## 2023-02-07 DIAGNOSIS — O0992 Supervision of high risk pregnancy, unspecified, second trimester: Secondary | ICD-10-CM

## 2023-02-07 DIAGNOSIS — N949 Unspecified condition associated with female genital organs and menstrual cycle: Secondary | ICD-10-CM

## 2023-02-07 DIAGNOSIS — Z3A26 26 weeks gestation of pregnancy: Secondary | ICD-10-CM

## 2023-02-07 DIAGNOSIS — F411 Generalized anxiety disorder: Secondary | ICD-10-CM

## 2023-02-07 DIAGNOSIS — G43109 Migraine with aura, not intractable, without status migrainosus: Secondary | ICD-10-CM | POA: Diagnosis not present

## 2023-02-07 DIAGNOSIS — O0932 Supervision of pregnancy with insufficient antenatal care, second trimester: Secondary | ICD-10-CM

## 2023-02-07 DIAGNOSIS — O10912 Unspecified pre-existing hypertension complicating pregnancy, second trimester: Secondary | ICD-10-CM

## 2023-02-07 NOTE — Patient Instructions (Signed)
For prevention of migraines in pregnancy: -Magnesium, 400mg  by mouth, once daily -Vitamin B2, 400mg  by mouth, once daily  For treatment of migraines in pregnancy: -take medication at the first sign of the pain of a headache, or the first sign of your aura -start with 1000mg  Excedrin Tension (do not exceed 4000mg  of Tylenol in 24hrs), with or without Reglan 10mg  -if no relief after 1-2hours, can take Flexeril 10mg  -if headache is severe and not relieved by the above, may take Fioricet, 1 tablet, no more than 3 days per month -Fioricet should only be used as a rescue medication, when absolutely necessary -if the above regimen does not resolve your headache at all, please come to MAU for additional treatment -if you take Fioricet, please be aware that this has Tylenol in it and will contribute to the 4,000mg  of Tylenol that you are allowed to take per day

## 2023-02-08 ENCOUNTER — Encounter: Payer: Self-pay | Admitting: Certified Nurse Midwife

## 2023-02-15 ENCOUNTER — Other Ambulatory Visit: Payer: Self-pay

## 2023-02-15 DIAGNOSIS — O0990 Supervision of high risk pregnancy, unspecified, unspecified trimester: Secondary | ICD-10-CM

## 2023-02-16 ENCOUNTER — Other Ambulatory Visit: Payer: Self-pay

## 2023-02-16 ENCOUNTER — Other Ambulatory Visit: Payer: Medicaid Other

## 2023-02-16 DIAGNOSIS — O0990 Supervision of high risk pregnancy, unspecified, unspecified trimester: Secondary | ICD-10-CM

## 2023-02-17 LAB — RPR: RPR Ser Ql: NONREACTIVE

## 2023-02-17 LAB — CBC
Hematocrit: 39.1 % (ref 34.0–46.6)
Hemoglobin: 12.7 g/dL (ref 11.1–15.9)
MCH: 28.3 pg (ref 26.6–33.0)
MCHC: 32.5 g/dL (ref 31.5–35.7)
MCV: 87 fL (ref 79–97)
Platelets: 248 10*3/uL (ref 150–450)
RBC: 4.48 x10E6/uL (ref 3.77–5.28)
RDW: 13.2 % (ref 11.7–15.4)
WBC: 9 10*3/uL (ref 3.4–10.8)

## 2023-02-17 LAB — HIV ANTIBODY (ROUTINE TESTING W REFLEX): HIV Screen 4th Generation wRfx: NONREACTIVE

## 2023-02-17 LAB — GLUCOSE TOLERANCE, 2 HOURS W/ 1HR
Glucose, 1 hour: 149 mg/dL (ref 70–179)
Glucose, 2 hour: 140 mg/dL (ref 70–152)
Glucose, Fasting: 71 mg/dL (ref 70–91)

## 2023-02-19 NOTE — Progress Notes (Unsigned)
   OBSTETRICS PRENATAL VIRTUAL VISIT ENCOUNTER NOTE  Provider location: [facility] {Blank single:19197::"[facility]"}   Patient location: Home  I connected with Brandi [name] on 02/19/23 at  1:55 PM EDT by MyChart Video Encounter and verified that I am speaking with the correct person using two identifiers. I discussed the limitations, risks, security and privacy concerns of performing an evaluation and management service virtually and the availability of in person appointments. I also discussed with the patient that there may be a patient responsible charge related to this service. The patient expressed understanding and agreed to proceed. Subjective:  Brandi [name] is a 27 y.o. G3P1011 at [redacted]w[redacted]d being seen today for ongoing prenatal care.  She is currently monitored for the following issues for this {Blank single:19197::"high-risk","low-risk"} pregnancy and has Diastasis of rectus abdominis; Hernia of anterior abdominal wall; Generalized anxiety disorder; Migraine; PCOS (polycystic ovarian syndrome); Supervision of high risk pregnancy, antepartum; and Chronic hypertension affecting pregnancy on their problem list.  Patient reports {sx:14538}.   .  .   . Denies any leaking of fluid.   The following portions of the patient's history were reviewed and updated as appropriate: allergies, current medications, past family history, past medical history, past social history, past surgical history and problem list.   Objective:  There were no vitals filed for this visit.  Fetal Status:           General:  Alert, oriented and cooperative. Patient is in no acute distress.  Respiratory: Normal respiratory effort, no problems with respiration noted  Mental Status: Normal mood and affect. Normal behavior. Normal judgment and thought content.  Rest of physical  exam deferred due to type of encounter  Imaging: No results found.  Assessment and Plan:  Pregnancy: G3P1011 at [redacted]w[redacted]d 1. Supervision of high risk pregnancy, antepartum ***  2. [redacted] weeks gestation of pregnancy ***  3. Chronic hypertension affecting pregnancy ***  {Blank single:19197::"Term","Preterm"} labor symptoms and general obstetric precautions including but not limited to vaginal bleeding, contractions, leaking of fluid and fetal movement were reviewed in detail with the patient. I discussed the assessment and treatment plan with the patient. The patient was provided an opportunity to ask questions and all were answered. The patient agreed with the plan and demonstrated an understanding of the instructions. The patient was advised to call back or seek an in-person office evaluation/go to MAU at [facility] for any urgent or concerning symptoms. Please refer to After Visit Summary for other counseling recommendations.   I provided *** minutes of face-to-face time during this encounter.  No follow-ups on file.  Future Appointments  Date Time Provider Department Center  02/20/2023  1:55 PM [facility]borne Oman [facility]  02/23/2023  8:30 AM [facility] NURSE [facility]  03/07/2023 10:15 AM Bernerd Limbo, CNM [facility]  03/09/2023  8:30 AM [facility] NURSE [facility]  03/21/2023 10:15 AM [facility]  03/23/2023  8:30 AM [facility] NURSE [facility]  04/04/2023  9:15 AM Osborne Oman [facility]  04/06/2023  8:30 AM [facility] NURSE [facility]    Bernerd Limbo, CNM [facility] [employer], [facility]

## 2023-02-20 ENCOUNTER — Telehealth: Payer: Medicaid Other | Admitting: Certified Nurse Midwife

## 2023-02-20 DIAGNOSIS — O10919 Unspecified pre-existing hypertension complicating pregnancy, unspecified trimester: Secondary | ICD-10-CM

## 2023-02-20 DIAGNOSIS — O10912 Unspecified pre-existing hypertension complicating pregnancy, second trimester: Secondary | ICD-10-CM

## 2023-02-20 DIAGNOSIS — O0990 Supervision of high risk pregnancy, unspecified, unspecified trimester: Secondary | ICD-10-CM

## 2023-02-20 DIAGNOSIS — O0992 Supervision of high risk pregnancy, unspecified, second trimester: Secondary | ICD-10-CM

## 2023-02-20 DIAGNOSIS — Z3A29 29 weeks gestation of pregnancy: Secondary | ICD-10-CM

## 2023-02-20 DIAGNOSIS — G4486 Cervicogenic headache: Secondary | ICD-10-CM

## 2023-02-20 NOTE — Progress Notes (Signed)
No batteries for home BP cuff. Coming Friday, 02/23/23 for BP and FHR check with RN. Headache yesterday to early AM today; no headache now. Denies vision changes or dizziness.  Fleet Contras RN

## 2023-02-23 ENCOUNTER — Ambulatory Visit: Payer: Medicaid Other

## 2023-03-06 NOTE — Progress Notes (Unsigned)
   OBSTETRICS PRENATAL VIRTUAL VISIT ENCOUNTER NOTE  Provider location: [facility] at {Blank single:19197::"[facility]"}   Patient location: Home  I connected with Brandi [name] on 03/06/23 at 10:35 AM EDT by MyChart Video Encounter and verified that I am speaking with the correct person using two identifiers. I discussed the limitations, risks, security and privacy concerns of performing an evaluation and management service virtually and the availability of in person appointments. I also discussed with the patient that there may be a patient responsible charge related to this service. The patient expressed understanding and agreed to proceed. Subjective:  Brandi [name] is a 27 y.o. G3P1011 at [redacted]w[redacted]d being seen today for ongoing prenatal care.  She is currently monitored for the following issues for this {Blank single:19197::"high-risk","low-risk"} pregnancy and has Diastasis of rectus abdominis; Hernia of anterior abdominal wall; Generalized anxiety disorder; Migraine; PCOS (polycystic ovarian syndrome); Supervision of high risk pregnancy, antepartum; and Chronic hypertension affecting pregnancy on their problem list.  Patient reports {sx:14538}.   .  .   . Denies any leaking of fluid.   The following portions of the patient's history were reviewed and updated as appropriate: allergies, current medications, past family history, past medical history, past social history, past surgical history and problem list.   Objective:  There were no vitals filed for this visit.  Fetal Status:           General:  Alert, oriented and cooperative. Patient is in no acute distress.  Respiratory: Normal respiratory effort, no problems with respiration noted  Mental Status: Normal mood and affect. Normal behavior. Normal judgment and thought content.  Rest of physical  exam deferred due to type of encounter  Imaging: No results found.  Assessment and Plan:  Pregnancy: G3P1011 at [redacted]w[redacted]d 1. Supervision of high risk pregnancy, antepartum ***  2. [redacted] weeks gestation of pregnancy ***  3. Chronic hypertension affecting pregnancy ***  {Blank single:19197::"Term","Preterm"} labor symptoms and general obstetric precautions including but not limited to vaginal bleeding, contractions, leaking of fluid and fetal movement were reviewed in detail with the patient. I discussed the assessment and treatment plan with the patient. The patient was provided an opportunity to ask questions and all were answered. The patient agreed with the plan and demonstrated an understanding of the instructions. The patient was advised to call back or seek an in-person office evaluation/go to MAU at [facility] for any urgent or concerning symptoms. Please refer to After Visit Summary for other counseling recommendations.   I provided *** minutes of face-to-face time during this encounter.  No follow-ups on file.  Future Appointments  Date Time Provider Department Center  03/07/2023 10:35 AM [facility]  03/09/2023  8:30 AM [facility] NURSE [facility]  03/12/2023  2:30 PM Levert Feinstein, MD [facility] None  03/21/2023 10:15 AM Bernerd Limbo, CNM [facility]  03/23/2023  8:30 AM [facility] NURSE [facility]  04/04/2023  9:15 AM Bernerd Limbo, CNM [facility]  04/06/2023  8:30 AM [facility] NURSE [facility]    Bernerd Limbo, CNM [facility] [employer], [facility]

## 2023-03-07 ENCOUNTER — Telehealth: Payer: Medicaid Other | Admitting: Certified Nurse Midwife

## 2023-03-07 ENCOUNTER — Telehealth (INDEPENDENT_AMBULATORY_CARE_PROVIDER_SITE_OTHER): Payer: Medicaid Other | Admitting: Certified Nurse Midwife

## 2023-03-07 VITALS — BP 125/72 | HR 76

## 2023-03-07 DIAGNOSIS — Z3A31 31 weeks gestation of pregnancy: Secondary | ICD-10-CM

## 2023-03-07 DIAGNOSIS — O10919 Unspecified pre-existing hypertension complicating pregnancy, unspecified trimester: Secondary | ICD-10-CM

## 2023-03-07 DIAGNOSIS — O10913 Unspecified pre-existing hypertension complicating pregnancy, third trimester: Secondary | ICD-10-CM

## 2023-03-07 DIAGNOSIS — O0990 Supervision of high risk pregnancy, unspecified, unspecified trimester: Secondary | ICD-10-CM

## 2023-03-07 MED ORDER — ASPIRIN 81 MG PO TBEC
162.0000 mg | DELAYED_RELEASE_TABLET | Freq: Every day | ORAL | 6 refills | Status: DC
Start: 2023-03-07 — End: 2023-06-14

## 2023-03-07 NOTE — Progress Notes (Unsigned)
Having discomfort at night. Unsure if this is sciatic pain or other hip pain. Discussed supporting belly with pillow underneath and trying a pillow between her knees to see if this relieves hip pain. When asked if she's taking magnesium she reported it makes her feel jittery and weird, describes a foggy feeling. Was taking right before bed but has since stopped.  Labetalol-- was taking once a day before bedtime because she didn't realize she should be taking BID. Starting taking BID when she noticed and began vomiting and "feeling weird." Describes as feeling strange and tired. Can't remember if her heart felt faster or slower. The only thing she had changed was adding Labetalol in the morning. Took this for 2 days with same symptoms; symptoms resolved after stopping.  Having headache every day. This is worse when she stands quickly, some improvement with changing positions slowly. Noticed improvement with cup of coffee each day, vitamin D, and labetalol; they became less frequent, but not absent. Home BP below:

## 2023-03-08 NOTE — Addendum Note (Signed)
Addended by: Maxwell Marion E on: 03/08/2023 03:26 PM   Modules accepted: Orders

## 2023-03-09 ENCOUNTER — Ambulatory Visit (INDEPENDENT_AMBULATORY_CARE_PROVIDER_SITE_OTHER): Payer: Medicaid Other

## 2023-03-09 ENCOUNTER — Other Ambulatory Visit: Payer: Self-pay

## 2023-03-09 VITALS — BP 127/71 | HR 81 | Ht 63.0 in | Wt 209.6 lb

## 2023-03-09 DIAGNOSIS — Z0130 Encounter for examination of blood pressure without abnormal findings: Secondary | ICD-10-CM

## 2023-03-09 NOTE — Progress Notes (Signed)
Pt here today for BP check as requested per provider at virtual visit on 03/08/23.  BP 127/71.  Pt denies changes in vision however is having headaches.  Pt reports that she took Labetolol at night before bed and then woke up in the middle of the night with a throbbing headache.  Pt reports that she does not take Tylenol for the pain but uses cold compress on the forehead.  FHR 152.  Pt advised to take medication as provider requested and to call with concerns.   Leonette Nutting  03/09/23

## 2023-03-12 ENCOUNTER — Ambulatory Visit (INDEPENDENT_AMBULATORY_CARE_PROVIDER_SITE_OTHER): Payer: Medicaid Other | Admitting: Neurology

## 2023-03-12 ENCOUNTER — Encounter: Payer: Self-pay | Admitting: Neurology

## 2023-03-12 VITALS — BP 115/78 | HR 86

## 2023-03-12 DIAGNOSIS — G43719 Chronic migraine without aura, intractable, without status migrainosus: Secondary | ICD-10-CM | POA: Diagnosis not present

## 2023-03-12 NOTE — Progress Notes (Signed)
Chief Complaint  Patient presents with   New Patient (Initial Visit)    Rm15, alone, pregnant (expecting 2nd boy)  Cervicogenic headache:daily ha's upon waking up  Triggers: unidentifiable      ASSESSMENT AND PLAN  Brandi [name] is a 27 y.o. female   Chronic migraine Pregnant, third trimester, due on May 07, 2023  Her neurological examination is normal, including funduscopic examination,  I offered potential preventive medications and abortive treatment, could not tolerate magnesium oxide, worried about the side effect of medications, I even offered nerve block, she wants to hold it off,  Will take Tylenol as needed, may combine with Phenergan as needed, sleep should help,  Will return to clinic for worsening headaches  DIAGNOSTIC DATA (LABS, IMAGING, TESTING) - I reviewed patient records, labs, notes, testing and imaging myself where available.   MEDICAL HISTORY:  Brandi [name], is a 27 year old female seen in request by   obstetrician Bernerd Limbo, MD primary care nurse practitioner [place], [place] for evaluation of frequent headaches  I reviewed and summarized the referring note. PMHX.  She had long history of chronic migraine, her typical migraine are retro-orbital area pounding headache with light, noise sensitivity, used to happen only couple times each months, did very well during her previous pregnancy, she is currently [redacted] weeks pregnant, due date May 07, 2023  Since she was [redacted] weeks pregnant with current pregnancy, she began to notice frequent headaches, now almost daily basis, variable degree, she often can push through her headache, but couple times each week at the end of the day, but headache can be so severe, 10 out of 10, with light, noise sensitivity, nauseous, tends to stay on the left side, left occipital area tension pain  She has tried Tylenol, Flexeril with limited help, she also tried magnesium, could not tolerated, due to feeling  jittery   Today's examination is normal including funduscopy examination, she denies focal signs,  We have talked about medication choices, including nerve block, preventive medications, and as needed medicine, she worry about the side effect, decided to continue with Tylenol, may combine with Phenergan as needed, sleep usually help her headache  PHYSICAL EXAM:   Vitals:   03/12/23 1416 03/12/23 1417  BP: (!) 149/79 115/78  Pulse: 81 86      There is no height or weight on file to calculate BMI.  PHYSICAL EXAMNIATION:  Gen: NAD, conversant, well nourised, well groomed                     Cardiovascular: Regular rate rhythm, no peripheral edema, warm, nontender. Eyes: Conjunctivae clear without exudates or hemorrhage Neck: Supple, no carotid bruits. Pulmonary: Clear to auscultation bilaterally   NEUROLOGICAL EXAM:  MENTAL STATUS: Speech/cognition: Awake, alert, oriented to history taking and casual conversation CRANIAL NERVES: CN II: Visual fields are full to confrontation. Pupils are round equal and briskly reactive to light.  Funduscopy examination was normal bilaterally CN III, IV, VI: extraocular movement are normal. No ptosis. CN V: Facial sensation is intact to light touch CN VII: Face is symmetric with normal eye closure  CN VIII: Hearing is normal to causal conversation. CN IX, X: Phonation is normal. CN XI: Head turning and shoulder shrug are intact  MOTOR: There is no pronator drift of out-stretched arms. Muscle bulk and tone are normal. Muscle strength is normal.  REFLEXES: Reflexes are 2+ and symmetric at the biceps, triceps, knees, and ankles. Plantar responses are flexor.  SENSORY: Intact to  light touch, pinprick and vibratory sensation are intact in fingers and toes.  COORDINATION: There is no trunk or limb dysmetria noted.  GAIT/STANCE: Posture is normal. Gait is steady with normal steps, base, arm swing, and turning. Heel and toe walking are  normal. Tandem gait is normal.  Romberg is absent.  REVIEW OF SYSTEMS:  Full 14 system review of systems performed and notable only for as above All other review of systems were negative.   ALLERGIES: No Known Allergies  HOME MEDICATIONS: Current Outpatient Medications  Medication Sig Dispense Refill   aspirin EC 81 MG tablet Take 2 tablets (162 mg total) by mouth daily after breakfast. Swallow whole. 60 tablet 6   Cholecalciferol (VITAMIN D) 125 MCG (5000 UT) CAPS Take 1 capsule by mouth daily after breakfast. 30 capsule 11   Prenatal Vit-Fe Fumarate-FA (PRENATAL MULTIVITAMIN) TABS tablet Take 1 tablet by mouth daily at 12 noon.     sertraline (ZOLOFT) 25 MG tablet Take 1 tablet (25 mg total) by mouth daily after supper. 30 tablet 6   No current facility-administered medications for this visit.    PAST MEDICAL HISTORY: Past Medical History:  Diagnosis Date   Anxiety    COVID-19 virus infection 07/13/2021   Depression    HSV-1 infection    Onychomycosis 12/16/2020   Preeclampsia 07/13/2021   PTSD (post-traumatic stress disorder)    Tinea corporis 01/11/2021   UTI (urinary tract infection)     PAST SURGICAL HISTORY: Past Surgical History:  Procedure Laterality Date   HERNIA REPAIR  07/03/2006   HERNIA REPAIR  03/30/2022    FAMILY HISTORY: Family History  Problem Relation Age of Onset   Heart disease Mother    Stroke Mother    Hypertension Mother    Bipolar disorder Mother    Healthy Father    Anxiety disorder Sister    Depression Sister    Hypertension Sister    Hypertension Maternal Grandmother    Lung cancer Maternal Grandmother    Stroke Maternal Grandfather    Colon cancer Maternal Grandfather    Depression Paternal Grandmother    Hypertension Paternal Grandmother     SOCIAL HISTORY: Social History   Socioeconomic History   Marital status: Married    Spouse name: Not on file   Number of children: 1   Years of education: Not on file   Highest  education level: Associate degree: academic program  Occupational History   Occupation: unemployed  Tobacco Use   Smoking status: Former    Current packs/day: 0.50    Types: Cigarettes    Passive exposure: Never   Smokeless tobacco: Never  Vaping Use   Vaping status: Never Used  Substance and Sexual Activity   Alcohol use: Not Currently    Comment: occasional prior to pregnancy   Drug use: Never   Sexual activity: Not Currently    Birth control/protection: None  Other Topics Concern   Not on file  Social History Narrative   Not on file   Social Determinants of Health   Financial Resource Strain: Not on file  Food Insecurity: No Food Insecurity (02/07/2023)   Hunger Vital Sign    Worried About Running Out of Food in the Last Year: Never true    Ran Out of Food in the Last Year: Never true  Transportation Needs: No Transportation Needs (02/07/2023)   PRAPARE - Administrator, Civil Service (Medical): No    Lack of Transportation (Non-Medical): No  Physical Activity: Not  on file  Stress: Not on file  Social Connections: Not on file  Intimate Partner Violence: Not on file      Levert Feinstein, M.D. Ph.D.  [facility] [address], [address] [place], [place] [address] Ph: [phone] Fax: [phone]  CC:  Bernerd Limbo, CNM [address] [place],  [place] [address]  Arbie Cookey, Sung Amabile, NP

## 2023-03-15 ENCOUNTER — Inpatient Hospital Stay (HOSPITAL_COMMUNITY)
Admission: AD | Admit: 2023-03-15 | Discharge: 2023-03-16 | Disposition: A | Discharge status: Home / Self Care | Payer: Medicaid Other | Attending: Obstetrics and Gynecology | Admitting: Obstetrics and Gynecology

## 2023-03-15 ENCOUNTER — Other Ambulatory Visit: Payer: Self-pay

## 2023-03-15 ENCOUNTER — Encounter (HOSPITAL_COMMUNITY): Payer: Self-pay | Admitting: Obstetrics and Gynecology

## 2023-03-15 DIAGNOSIS — O4693 Antepartum hemorrhage, unspecified, third trimester: Secondary | ICD-10-CM | POA: Insufficient documentation

## 2023-03-15 DIAGNOSIS — N858 Other specified noninflammatory disorders of uterus: Secondary | ICD-10-CM | POA: Diagnosis not present

## 2023-03-15 DIAGNOSIS — Z3A32 32 weeks gestation of pregnancy: Secondary | ICD-10-CM | POA: Insufficient documentation

## 2023-03-15 DIAGNOSIS — O4703 False labor before 37 completed weeks of gestation, third trimester: Secondary | ICD-10-CM

## 2023-03-15 DIAGNOSIS — O0990 Supervision of high risk pregnancy, unspecified, unspecified trimester: Secondary | ICD-10-CM

## 2023-03-15 LAB — URINALYSIS, ROUTINE W REFLEX MICROSCOPIC
Bacteria, UA: NONE SEEN
Bilirubin Urine: NEGATIVE
Glucose, UA: NEGATIVE mg/dL
Hgb urine dipstick: NEGATIVE
Ketones, ur: 5 mg/dL — AB
Leukocytes,Ua: NEGATIVE
Nitrite: NEGATIVE
Protein, ur: 30 mg/dL — AB
Specific Gravity, Urine: 1.029 (ref 1.005–1.030)
pH: 6 (ref 5.0–8.0)

## 2023-03-15 LAB — WET PREP, GENITAL
Clue Cells Wet Prep HPF POC: NONE SEEN
Sperm: NONE SEEN
Trich, Wet Prep: NONE SEEN
WBC, Wet Prep HPF POC: <10 (ref <10)
Yeast Wet Prep HPF POC: NONE SEEN

## 2023-03-15 MED ORDER — NIFEDIPINE 10 MG PO CAPS
10.0000 mg | ORAL_CAPSULE | ORAL | Status: DC | PRN
  Administered 2023-03-15 (×2): 10 mg via ORAL
  Filled 2023-03-15 (×2): qty 1

## 2023-03-15 NOTE — MAU Note (Signed)
.  Brandi [name] is a 27 y.o. at [redacted]w[redacted]d here in MAU reporting going to bathroom tonight and saw light bleeding on tissue. Has been tired and some nausea.  Some abd cramping today. Reports good FM  Onset of complaint: today Pain score: 3 Vitals:   03/15/23 2148 03/15/23 2150  BP:  128/73  Pulse: 82   Resp: 17   Temp: 98.1 F (36.7 C)   SpO2: 100%      FHT:156 Lab orders placed from triage:   U/a

## 2023-03-15 NOTE — MAU Provider Note (Signed)
Chief Complaint:  Vaginal Bleeding, Nausea, and Abdominal Pain   Event Date/Time   First Provider Initiated Contact with Patient 03/15/23 2215      HPI: Brandi [name] is a 27 y.o. G3P1011 at 35w3dwho presents to maternity admissions reporting seeing small amount of bleeding tonight  Has some cramping as well Did have a bowel movement prior to seeing the blood on tissue. . She reports good fetal movement, denies LOF, vaginal itching/burning, urinary symptoms, h/a, dizziness, n/v, diarrhea, constipation or fever/chills.  She denies headache, visual changes or RUQ abdominal pain.  Vaginal Bleeding The patient's primary symptoms include pelvic pain and vaginal bleeding. The patient's pertinent negatives include no genital itching or genital odor. The current episode started today. Associated symptoms include abdominal pain and back pain. Pertinent negatives include no constipation, diarrhea, dysuria, fever or frequency. She has not been passing clots. She has not been passing tissue.  Abdominal Pain The current episode started today. The quality of the pain is cramping. Pertinent negatives include no constipation, diarrhea, dysuria, fever or frequency. The pain is relieved by Nothing. She has tried nothing for the symptoms.  RN Note: Brandi [name] is a 27 y.o. at [redacted]w[redacted]d here in MAU reporting going to bathroom tonight and saw light bleeding on tissue. Has been tired and some nausea.  Some abd cramping today. Reports good FM  Onset of complaint: today Pain score: 3  Past Medical History: Past Medical History:  Diagnosis Date   Anxiety    COVID-19 virus infection 07/13/2021   Depression    HSV-1 infection    Onychomycosis 12/16/2020   Preeclampsia 07/13/2021   PTSD (post-traumatic stress disorder)    Tinea corporis 01/11/2021   UTI (urinary tract infection)     Past obstetric history: OB History  Gravida Para Term Preterm AB Living  3 1 1   1 1   SAB IAB Ectopic Multiple Live  Births  1     0 1    # Outcome Date GA Lbr Len/2nd Weight Sex Type Anes PTL Lv  3 Current           2 Term 07/13/21 [redacted]w[redacted]d 19:17 / 00:48 3790 g M Vag-Spont EPI  LIV     Birth Comments: WDL  1 SAB 08/04/19            Past Surgical History: Past Surgical History:  Procedure Laterality Date   HERNIA REPAIR  07/03/2006   HERNIA REPAIR  03/30/2022    Family History: Family History  Problem Relation Age of Onset   Heart disease Mother    Stroke Mother    Hypertension Mother    Bipolar disorder Mother    Healthy Father    Anxiety disorder Sister    Depression Sister    Hypertension Sister    Hypertension Maternal Grandmother    Lung cancer Maternal Grandmother    Stroke Maternal Grandfather    Colon cancer Maternal Grandfather    Depression Paternal Grandmother    Hypertension Paternal Grandmother     Social History: Social History   Tobacco Use   Smoking status: Former    Current packs/day: 0.50    Types: Cigarettes    Passive exposure: Never   Smokeless tobacco: Never  Vaping Use   Vaping status: Never Used  Substance Use Topics   Alcohol use: Not Currently    Comment: occasional prior to pregnancy   Drug use: Never    Allergies: No Known Allergies  Meds:  Medications Prior to Admission  Medication Sig Dispense Refill Last Dose   aspirin EC 81 MG tablet Take 2 tablets (162 mg total) by mouth daily after breakfast. Swallow whole. 60 tablet 6    Cholecalciferol (VITAMIN D) 125 MCG (5000 UT) CAPS Take 1 capsule by mouth daily after breakfast. 30 capsule 11    Prenatal Vit-Fe Fumarate-FA (PRENATAL MULTIVITAMIN) TABS tablet Take 1 tablet by mouth daily at 12 noon.      sertraline (ZOLOFT) 25 MG tablet Take 1 tablet (25 mg total) by mouth daily after supper. 30 tablet 6     I have reviewed patient's Past Medical Hx, Surgical Hx, Family Hx, Social Hx, medications and allergies.   ROS:  Review of Systems  Constitutional:  Negative for fever.  Gastrointestinal:   Positive for abdominal pain. Negative for constipation and diarrhea.  Genitourinary:  Positive for pelvic pain and vaginal bleeding. Negative for dysuria and frequency.  Musculoskeletal:  Positive for back pain.   Other systems negative  Physical Exam  Patient Vitals for the past 24 hrs:  BP Temp Pulse Resp SpO2 Height Weight  03/15/23 2150 128/73 -- -- -- -- -- --  03/15/23 2148 -- 98.1 F (36.7 C) 82 17 100 % 5\' 3"  (1.6 m) 97.1 kg   Constitutional: Well-developed, well-nourished female in no acute distress.  Cardiovascular: normal rate and rhythm Respiratory: normal effort GI: Abd soft, non-tender, gravid appropriate for gestational age.   No rebound or guarding. MS: Extremities nontender, no edema, normal ROM Neurologic: Alert and oriented x 4.  GU: Neg CVAT.  PELVIC EXAM: Cervix pink, visually closed, without lesion, scant white creamy discharge, vaginal walls and external genitalia normal   NO BLOOD OR COLORED DISCHARGE SEEN  Cervix is closed and long  FHT:  Baseline 140 , moderate variability, accelerations present, no decelerations Contractions: Tiny 10 second uterine irritability cramps seen   Patient states can feel them somewhat.     Labs: Results for orders placed or performed during the hospital encounter of 03/15/23 (from the past 24 hour(s))  Urinalysis, Routine w reflex microscopic -Urine, Clean Catch     Status: Abnormal   Collection Time: 03/15/23  9:48 PM  Result Value Ref Range   Color, Urine YELLOW YELLOW   APPearance HAZY (A) CLEAR   Specific Gravity, Urine 1.029 1.005 - 1.030   pH 6.0 5.0 - 8.0   Glucose, UA NEGATIVE NEGATIVE mg/dL   Hgb urine dipstick NEGATIVE NEGATIVE   Bilirubin Urine NEGATIVE NEGATIVE   Ketones, ur 5 (A) NEGATIVE mg/dL   Protein, ur 30 (A) NEGATIVE mg/dL   Nitrite NEGATIVE NEGATIVE   Leukocytes,Ua NEGATIVE NEGATIVE   RBC / HPF 0-5 0 - 5 RBC/hpf   WBC, UA 0-5 0 - 5 WBC/hpf   Bacteria, UA NONE SEEN NONE SEEN   Squamous Epithelial  / HPF 6-10 0 - 5 /HPF   Mucus PRESENT   Wet prep, genital     Status: None   Collection Time: 03/15/23 10:13 PM   Specimen: Vaginal  Result Value Ref Range   Yeast Wet Prep HPF POC NONE SEEN NONE SEEN   Trich, Wet Prep NONE SEEN NONE SEEN   Clue Cells Wet Prep HPF POC NONE SEEN NONE SEEN   WBC, Wet Prep HPF POC <10 <10   Sperm NONE SEEN     O/Positive/-- (04/12 0000)  Imaging:  No results found.  MAU Course/MDM: I have reviewed the triage vital signs and the nursing notes.   Pertinent labs & imaging results  that were available during my care of the patient were reviewed by me and considered in my medical decision making (see chart for details).      I have reviewed her medical records including past results, notes and treatments.   I have ordered labs and reviewed results.  NST reviewed  Treatments in MAU included 2 doses of Procardia which resolved her irritability  Discussed bleeding may have been rectal given BM and negative vaginal exam.  Reviewed signs of PTL and bleeding to return for.    Assessment: Single IUP at [redacted]w[redacted]d Uterine irritability Threatened preterm labor Bleeding in third trimester, unclear source  Plan: Discharge home Preterm Labor precautions and fetal kick counts Bleeding precautions Follow up in Office for prenatal visits and recheck Encouraged to return if she develops worsening of symptoms, increase in pain, fever, or other concerning symptoms.   Pt stable at time of discharge.  Wynelle Bourgeois CNM, MSN Certified Nurse-Midwife 03/15/2023 10:15 PM

## 2023-03-16 DIAGNOSIS — O4703 False labor before 37 completed weeks of gestation, third trimester: Secondary | ICD-10-CM

## 2023-03-16 DIAGNOSIS — N858 Other specified noninflammatory disorders of uterus: Secondary | ICD-10-CM

## 2023-03-16 DIAGNOSIS — O4693 Antepartum hemorrhage, unspecified, third trimester: Secondary | ICD-10-CM

## 2023-03-16 DIAGNOSIS — Z3A32 32 weeks gestation of pregnancy: Secondary | ICD-10-CM

## 2023-03-16 LAB — GC/CHLAMYDIA PROBE AMP (~~LOC~~) NOT AT ARMC
Chlamydia: NEGATIVE
Comment: NEGATIVE
Comment: NORMAL
Neisseria Gonorrhea: NEGATIVE

## 2023-03-20 DIAGNOSIS — O99210 Obesity complicating pregnancy, unspecified trimester: Secondary | ICD-10-CM | POA: Insufficient documentation

## 2023-03-20 HISTORY — DX: Obesity complicating pregnancy, unspecified trimester: O99.210

## 2023-03-20 NOTE — Progress Notes (Unsigned)
   OBSTETRICS PRENATAL VIRTUAL VISIT ENCOUNTER NOTE  Provider location: [facility] {Blank single:19197::"[facility]"}   Patient location: Home  I connected with Brandi [name] on 03/20/23 at 10:15 AM EDT by MyChart Video Encounter and verified that I am speaking with the correct person using two identifiers. I discussed the limitations, risks, security and privacy concerns of performing an evaluation and management service virtually and the availability of in person appointments. I also discussed with the patient that there may be a patient responsible charge related to this service. The patient expressed understanding and agreed to proceed. Subjective:  Brandi [name] is a 27 y.o. G3P1011 at [redacted]w[redacted]d being seen today for ongoing prenatal care.  She is currently monitored for the following issues for this {Blank single:19197::"high-risk","low-risk"} pregnancy and has Diastasis of rectus abdominis; Hernia of anterior abdominal wall; Generalized anxiety disorder; Migraine; PCOS (polycystic ovarian syndrome); Supervision of high risk pregnancy, antepartum; and Chronic hypertension affecting pregnancy on their problem list.  Patient reports {sx:14538}.   .  .   . Denies any leaking of fluid.   The following portions of the patient's history were reviewed and updated as appropriate: allergies, current medications, past family history, past medical history, past social history, past surgical history and problem list.   Objective:  There were no vitals filed for this visit.  Fetal Status:           General:  Alert, oriented and cooperative. Patient is in no acute distress.  Respiratory: Normal respiratory effort, no problems with respiration noted  Mental Status: Normal mood and affect. Normal behavior. Normal judgment and thought content.  Rest of physical  exam deferred due to type of encounter  Imaging: No results found.  Assessment and Plan:  Pregnancy: G3P1011 at [redacted]w[redacted]d 1. Supervision of high risk pregnancy, antepartum ***  2. [redacted] weeks gestation of pregnancy ***  3. Chronic hypertension affecting pregnancy ***  4. Migraine with aura and without status migrainosus, not intractable ***  {Blank single:19197::"Term","Preterm"} labor symptoms and general obstetric precautions including but not limited to vaginal bleeding, contractions, leaking of fluid and fetal movement were reviewed in detail with the patient. I discussed the assessment and treatment plan with the patient. The patient was provided an opportunity to ask questions and all were answered. The patient agreed with the plan and demonstrated an understanding of the instructions. The patient was advised to call back or seek an in-person office evaluation/go to MAU at [facility] for any urgent or concerning symptoms. Please refer to After Visit Summary for other counseling recommendations.   I provided *** minutes of face-to-face time during this encounter.  No follow-ups on file.  Future Appointments  Date Time Provider Department Center  03/21/2023 10:15 AM [facility]  03/23/2023  7:15 AM [facility] NURSE [facility]  03/23/2023  7:30 AM [facility] US1 [facility]  04/04/2023  9:15 AM [facility]  04/06/2023  8:30 AM [facility] NURSE [facility]    Bernerd Limbo, CNM [facility] [employer], [facility]

## 2023-03-21 ENCOUNTER — Telehealth: Payer: Medicaid Other | Admitting: Certified Nurse Midwife

## 2023-03-21 VITALS — BP 126/77 | HR 96

## 2023-03-21 DIAGNOSIS — O0990 Supervision of high risk pregnancy, unspecified, unspecified trimester: Secondary | ICD-10-CM

## 2023-03-21 DIAGNOSIS — O10919 Unspecified pre-existing hypertension complicating pregnancy, unspecified trimester: Secondary | ICD-10-CM

## 2023-03-21 DIAGNOSIS — Z3A33 33 weeks gestation of pregnancy: Secondary | ICD-10-CM

## 2023-03-21 DIAGNOSIS — O0993 Supervision of high risk pregnancy, unspecified, third trimester: Secondary | ICD-10-CM

## 2023-03-21 DIAGNOSIS — O10913 Unspecified pre-existing hypertension complicating pregnancy, third trimester: Secondary | ICD-10-CM

## 2023-03-21 DIAGNOSIS — G43109 Migraine with aura, not intractable, without status migrainosus: Secondary | ICD-10-CM

## 2023-03-21 NOTE — Progress Notes (Signed)
Seen at MAU on 03/15/23 for vaginal spotting. Reports a few days before MAU visit she had regular, painful contractions. When she started to track, they went away. Provider told her she was having uterine irritability at MAU and was given Procardia.  No vaginal bleeding since visit to MAU on 03/15/23. Yesterday she was experiencing tightening of her abdomen, none today. Describes this as more uncomfortable than painful. She has been struggling to eat regularly due to poor appetite. Reports good hydration.   Tried taking labetalol at night following visit with Jamilla on 03/07/23. Had a severe headache after taking, so she hasn't taken since day of last visit.   Fleet Contras RN 03/21/23

## 2023-03-23 ENCOUNTER — Ambulatory Visit: Payer: Medicaid Other

## 2023-03-23 ENCOUNTER — Ambulatory Visit: Payer: Self-pay

## 2023-03-23 ENCOUNTER — Ambulatory Visit: Payer: Medicaid Other | Attending: Certified Nurse Midwife

## 2023-03-23 ENCOUNTER — Encounter: Payer: Self-pay | Admitting: *Deleted

## 2023-03-23 ENCOUNTER — Ambulatory Visit: Payer: Medicaid Other | Admitting: *Deleted

## 2023-03-23 VITALS — BP 130/71 | HR 92

## 2023-03-23 DIAGNOSIS — O0990 Supervision of high risk pregnancy, unspecified, unspecified trimester: Secondary | ICD-10-CM | POA: Insufficient documentation

## 2023-03-23 DIAGNOSIS — Z3A33 33 weeks gestation of pregnancy: Secondary | ICD-10-CM | POA: Diagnosis not present

## 2023-03-23 DIAGNOSIS — O10919 Unspecified pre-existing hypertension complicating pregnancy, unspecified trimester: Secondary | ICD-10-CM | POA: Diagnosis present

## 2023-03-23 DIAGNOSIS — O99213 Obesity complicating pregnancy, third trimester: Secondary | ICD-10-CM

## 2023-03-23 DIAGNOSIS — O10013 Pre-existing essential hypertension complicating pregnancy, third trimester: Secondary | ICD-10-CM

## 2023-03-23 DIAGNOSIS — E669 Obesity, unspecified: Secondary | ICD-10-CM | POA: Diagnosis not present

## 2023-03-23 DIAGNOSIS — O99210 Obesity complicating pregnancy, unspecified trimester: Secondary | ICD-10-CM

## 2023-03-26 ENCOUNTER — Other Ambulatory Visit: Payer: Self-pay | Admitting: *Deleted

## 2023-03-26 DIAGNOSIS — O10913 Unspecified pre-existing hypertension complicating pregnancy, third trimester: Secondary | ICD-10-CM

## 2023-03-29 ENCOUNTER — Inpatient Hospital Stay (HOSPITAL_COMMUNITY)
Admission: AD | Admit: 2023-03-29 | Discharge: 2023-03-30 | Disposition: A | Discharge status: Home / Self Care | Payer: Medicaid Other | Attending: Obstetrics and Gynecology | Admitting: Obstetrics and Gynecology

## 2023-03-29 ENCOUNTER — Encounter (HOSPITAL_COMMUNITY): Payer: Self-pay | Admitting: Obstetrics and Gynecology

## 2023-03-29 DIAGNOSIS — M549 Dorsalgia, unspecified: Secondary | ICD-10-CM

## 2023-03-29 DIAGNOSIS — B379 Candidiasis, unspecified: Secondary | ICD-10-CM

## 2023-03-29 DIAGNOSIS — B3731 Acute candidiasis of vulva and vagina: Secondary | ICD-10-CM | POA: Insufficient documentation

## 2023-03-29 DIAGNOSIS — R102 Pelvic and perineal pain: Secondary | ICD-10-CM | POA: Insufficient documentation

## 2023-03-29 DIAGNOSIS — M545 Low back pain, unspecified: Secondary | ICD-10-CM | POA: Insufficient documentation

## 2023-03-29 DIAGNOSIS — Z3A34 34 weeks gestation of pregnancy: Secondary | ICD-10-CM | POA: Insufficient documentation

## 2023-03-29 DIAGNOSIS — O98813 Other maternal infectious and parasitic diseases complicating pregnancy, third trimester: Secondary | ICD-10-CM | POA: Insufficient documentation

## 2023-03-29 NOTE — MAU Note (Signed)
.  Brandi [name] is a 27 y.o. at [redacted]w[redacted]d here in MAU reporting: lower back pain-has been constant - unsure if contracting-Braxton Hicks feel stronger-also reporting nausea. Denies SROM, vaginal bleeding or bloody show. Endorses + fetal movement. Pt reports that mother just passed and she has been experiencing a lot of emotional stress.  Onset of complaint: Thursday Pain score: 8 lower back-burning Vitals:   03/29/23 2324  BP: 133/75  Pulse: 88  Resp: 16  Temp: 97.9 F (36.6 C)  SpO2: 100%     [id number] will obtain in room Lab orders placed from triage:  UA

## 2023-03-30 DIAGNOSIS — R102 Pelvic and perineal pain: Secondary | ICD-10-CM

## 2023-03-30 DIAGNOSIS — M549 Dorsalgia, unspecified: Secondary | ICD-10-CM

## 2023-03-30 DIAGNOSIS — O26893 Other specified pregnancy related conditions, third trimester: Secondary | ICD-10-CM | POA: Diagnosis not present

## 2023-03-30 DIAGNOSIS — M545 Low back pain, unspecified: Secondary | ICD-10-CM | POA: Diagnosis present

## 2023-03-30 DIAGNOSIS — B379 Candidiasis, unspecified: Secondary | ICD-10-CM

## 2023-03-30 DIAGNOSIS — B3731 Acute candidiasis of vulva and vagina: Secondary | ICD-10-CM | POA: Diagnosis not present

## 2023-03-30 DIAGNOSIS — Z3A34 34 weeks gestation of pregnancy: Secondary | ICD-10-CM | POA: Diagnosis not present

## 2023-03-30 DIAGNOSIS — R110 Nausea: Secondary | ICD-10-CM | POA: Diagnosis present

## 2023-03-30 DIAGNOSIS — O98813 Other maternal infectious and parasitic diseases complicating pregnancy, third trimester: Secondary | ICD-10-CM | POA: Diagnosis not present

## 2023-03-30 DIAGNOSIS — Z634 Disappearance and death of family member: Secondary | ICD-10-CM | POA: Diagnosis present

## 2023-03-30 LAB — GC/CHLAMYDIA PROBE AMP (~~LOC~~) NOT AT ARMC
Chlamydia: NEGATIVE
Comment: NEGATIVE
Comment: NORMAL
Neisseria Gonorrhea: NEGATIVE

## 2023-03-30 LAB — URINALYSIS, ROUTINE W REFLEX MICROSCOPIC
Bilirubin Urine: NEGATIVE
Glucose, UA: NEGATIVE mg/dL
Hgb urine dipstick: NEGATIVE
Ketones, ur: 20 mg/dL — AB
Leukocytes,Ua: NEGATIVE
Nitrite: NEGATIVE
Protein, ur: NEGATIVE mg/dL
Specific Gravity, Urine: 1.021 (ref 1.005–1.030)
pH: 6 (ref 5.0–8.0)

## 2023-03-30 LAB — WET PREP, GENITAL
Clue Cells Wet Prep HPF POC: NONE SEEN
Sperm: NONE SEEN
Trich, Wet Prep: NONE SEEN
WBC, Wet Prep HPF POC: >=10 — AB (ref <10)

## 2023-03-30 IMAGING — CR DG CHEST 2V
2 series · 2 of 2 positions shown · non-contrast
Comparison: None.

CLINICAL DATA: shob

EXAM:
CHEST - 2 VIEW

[chest pa]
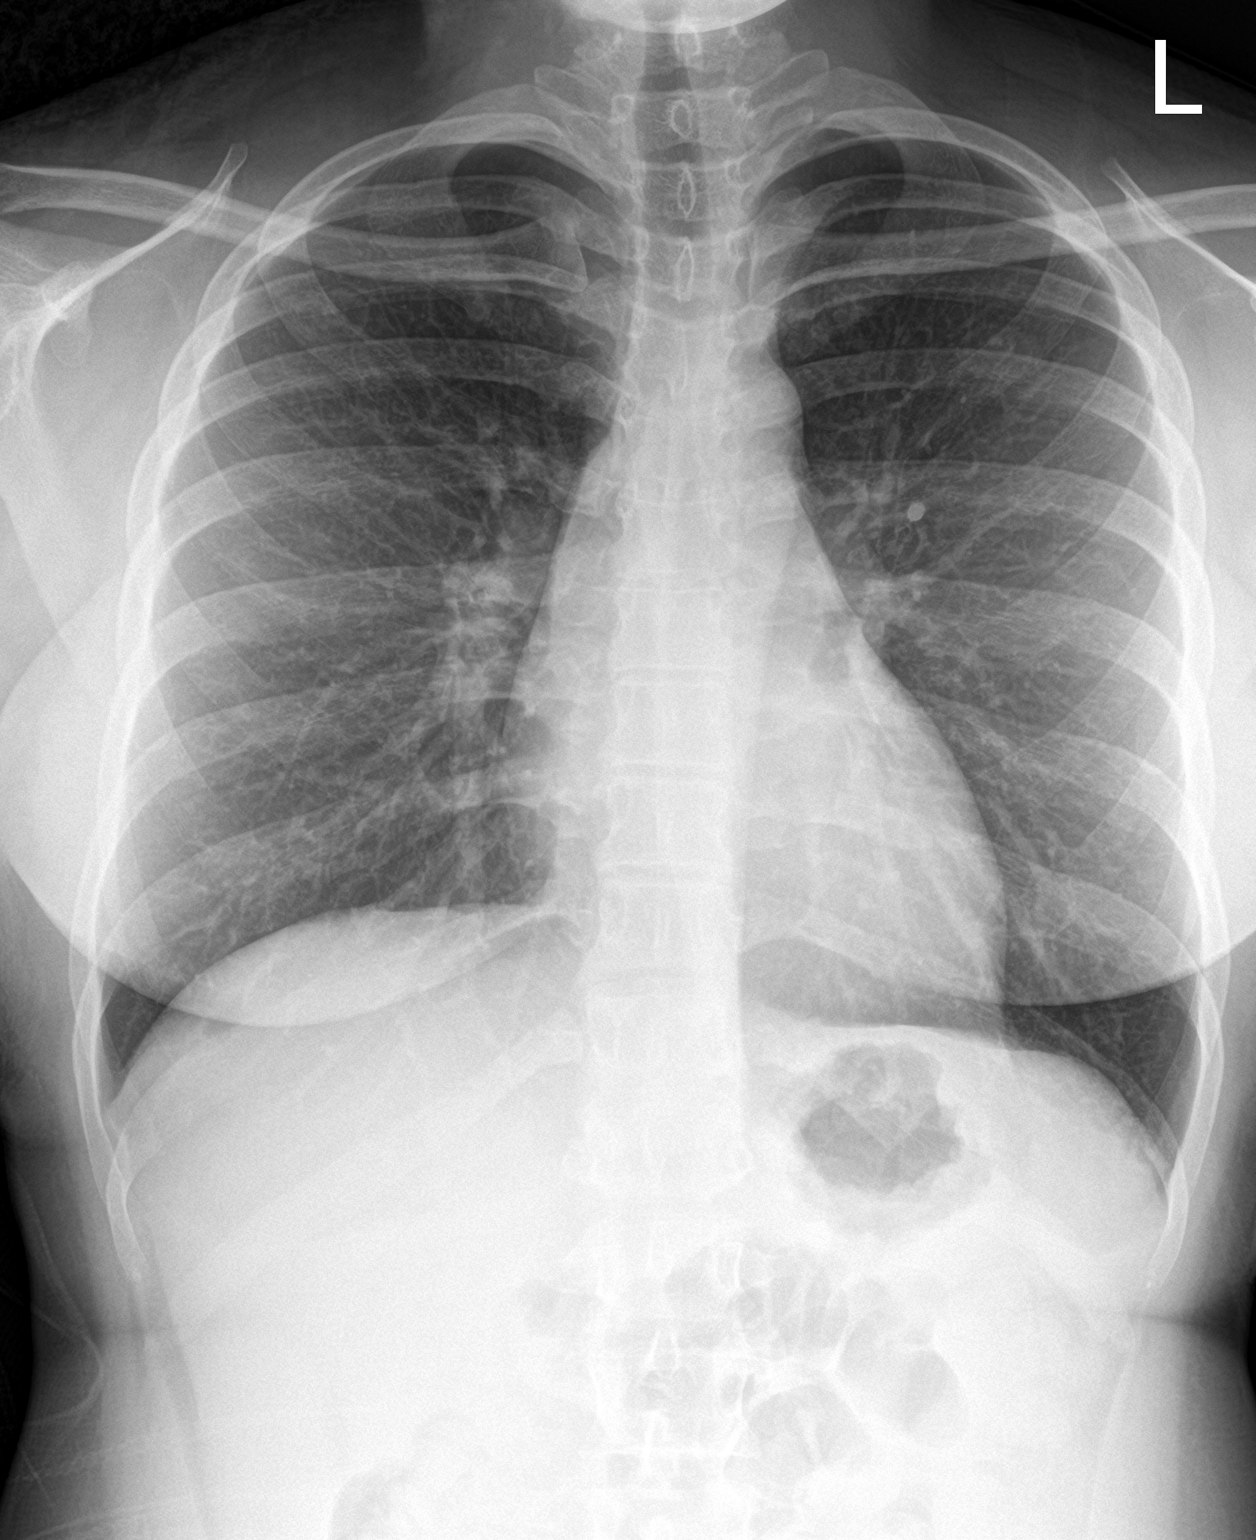

[chest lat]
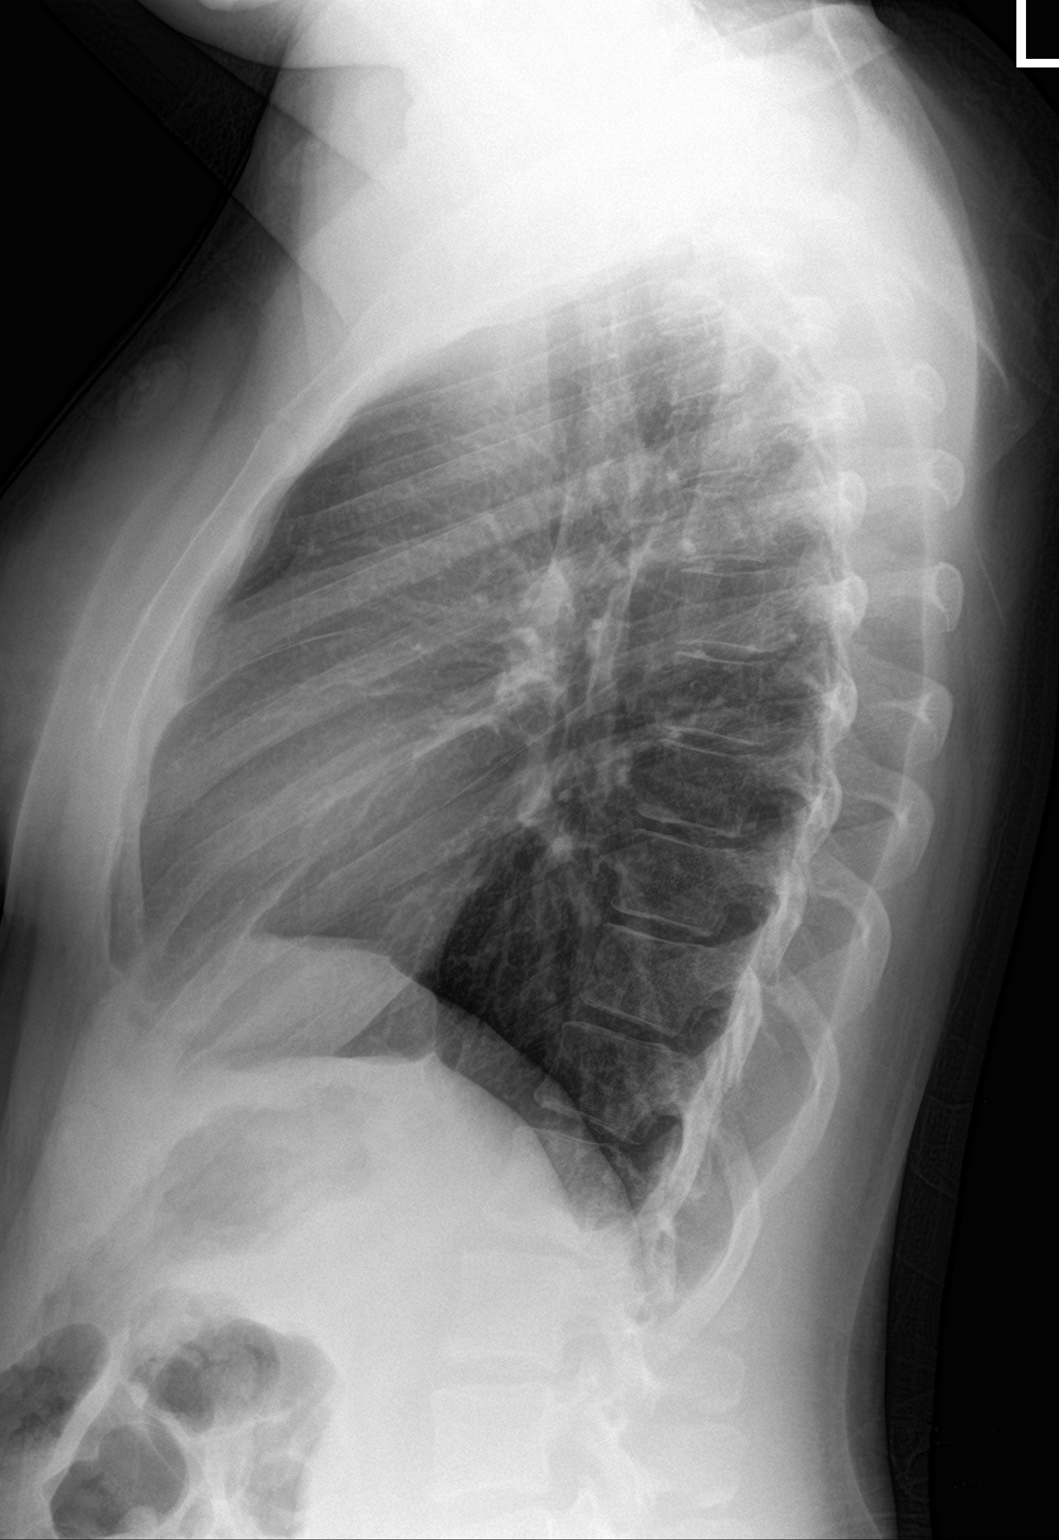

[2 of 2 positions shown; findings below may reference images not displayed]

FINDINGS: The heart and mediastinal contours are within normal limits.

No focal consolidation. No pulmonary edema. No pleural effusion. No
pneumothorax.

No acute osseous abnormality.
IMPRESSION: No active cardiopulmonary disease.

## 2023-03-30 MED ORDER — CYCLOBENZAPRINE HCL 5 MG PO TABS
10.0000 mg | ORAL_TABLET | Freq: Once | ORAL | Status: AC
  Administered 2023-03-30: 10 mg via ORAL
  Filled 2023-03-30: qty 2

## 2023-03-30 MED ORDER — CYCLOBENZAPRINE HCL 5 MG PO TABS: 5.0000 mg | ORAL_TABLET | Freq: Three times a day (TID) | ORAL | 0 refills | Status: DC | PRN

## 2023-03-30 MED ORDER — ACETAMINOPHEN 500 MG PO TABS
1000.0000 mg | ORAL_TABLET | Freq: Once | ORAL | Status: AC
  Administered 2023-03-30: 1000 mg via ORAL
  Filled 2023-03-30: qty 2

## 2023-03-30 MED ORDER — TERCONAZOLE 0.4 % VA CREA
1.0000 | TOPICAL_CREAM | Freq: Every day | VAGINAL | 0 refills | Status: DC
Start: 2023-03-30 — End: 2023-03-31

## 2023-03-30 NOTE — MAU Provider Note (Signed)
History     CSN: [id number]  Arrival date and time: 03/29/23 2216   Event Date/Time   First Provider Initiated Contact with Patient 03/30/23 [phone]      Chief Complaint  Patient presents with   Abdominal Pain   Brandi [name] , a  27 y.o. G3P1011 at [redacted]w[redacted]d presents to MAU with complaints of intermittent lower back pain and lower abdominal cramping.  Patient reports that pain started on Thursday and states that its just gotten progressively worse since. She denies worsening or alleviating  symptoms and attempting to relieve symptoms. She denies vaginal bleeding, leaking of fluid and is "unsure" if she is contracting. She endorses positive fetal movement.          OB History     Gravida  3   Para  1   Term  1   Preterm      AB  1   Living  1      SAB  1   IAB      Ectopic      Multiple  0   Live Births  1           Past Medical History:  Diagnosis Date   Anxiety    COVID-19 virus infection 07/13/2021   Depression    HSV-1 infection    Onychomycosis 12/16/2020   Preeclampsia 07/13/2021   PTSD (post-traumatic stress disorder)    Tinea corporis 01/11/2021   UTI (urinary tract infection)     Past Surgical History:  Procedure Laterality Date   HERNIA REPAIR  07/03/2006   HERNIA REPAIR  03/30/2022    Family History  Problem Relation Age of Onset   Heart disease Mother    Stroke Mother    Hypertension Mother    Bipolar disorder Mother    Healthy Father    Anxiety disorder Sister    Depression Sister    Hypertension Sister    Hypertension Maternal Grandmother    Lung cancer Maternal Grandmother    Stroke Maternal Grandfather    Colon cancer Maternal Grandfather    Depression Paternal Grandmother    Hypertension Paternal Grandmother     Social History   Tobacco Use   Smoking status: Former    Current packs/day: 0.50    Types: Cigarettes    Passive exposure: Never   Smokeless tobacco: Never  Vaping Use   Vaping status: Never  Used  Substance Use Topics   Alcohol use: Not Currently    Comment: occasional prior to pregnancy   Drug use: Never    Allergies: No Known Allergies  Medications Prior to Admission  Medication Sig Dispense Refill Last Dose   aspirin EC 81 MG tablet Take 2 tablets (162 mg total) by mouth daily after breakfast. Swallow whole. 60 tablet 6 03/28/2023   Cholecalciferol (VITAMIN D) 125 MCG (5000 UT) CAPS Take 1 capsule by mouth daily after breakfast. 30 capsule 11 03/28/2023   Prenatal Vit-Fe Fumarate-FA (PRENATAL MULTIVITAMIN) TABS tablet Take 1 tablet by mouth daily at 12 noon.   03/28/2023   sertraline (ZOLOFT) 25 MG tablet Take 1 tablet (25 mg total) by mouth daily after supper. (Patient not taking: Reported on 03/21/2023) 30 tablet 6     Review of Systems  Constitutional:  Negative for chills, fatigue and fever.  Eyes:  Negative for pain and visual disturbance.  Respiratory:  Negative for apnea, shortness of breath and wheezing.   Cardiovascular:  Negative for chest pain and palpitations.  Gastrointestinal:  Positive  for abdominal pain. Negative for constipation, diarrhea, nausea and vomiting.  Genitourinary:  Positive for pelvic pain and vaginal discharge. Negative for difficulty urinating, dysuria, vaginal bleeding and vaginal pain.  Musculoskeletal:  Positive for back pain.  Neurological:  Negative for seizures, weakness and headaches.  Psychiatric/Behavioral:  Negative for suicidal ideas.    Physical Exam   Blood pressure 133/75, pulse 88, temperature 97.9 F (36.6 C), temperature source Oral, resp. rate 16, height 5\' 3"  (1.6 m), weight 96.8 kg, last menstrual period 08/06/2022, SpO2 100%, not currently breastfeeding.  Physical Exam Vitals and nursing note reviewed. Exam conducted with a chaperone present.  Constitutional:      General: She is not in acute distress.    Appearance: Normal appearance.  HENT:     Head: Normocephalic.  Pulmonary:     Effort: Pulmonary effort is  normal.  Abdominal:     Palpations: Abdomen is soft.     Tenderness: There is no abdominal tenderness.  Genitourinary:    Vagina: Vaginal discharge present.  Musculoskeletal:     Cervical back: Normal range of motion.  Skin:    General: Skin is warm and dry.  Neurological:     Mental Status: She is alert and oriented to person, place, and time.  Psychiatric:        Mood and Affect: Mood normal.    Dilation: 1 Effacement (%): Thick Station: Ballotable Exam by:: Rhunette Croft CNM  FHT: 135bpm with moderate variability accels present no decles  Toco: UI  Appropriate for gestational age.    MAU Course  Procedures Orders Placed This Encounter  Procedures   Wet prep, genital   Urinalysis, Routine w reflex microscopic -Urine, Clean Catch   Discharge patient   Results for orders placed or performed during the hospital encounter of 03/29/23 (from the past 24 hour(s))  Urinalysis, Routine w reflex microscopic -Urine, Clean Catch     Status: Abnormal   Collection Time: 03/30/23  1:34 AM  Result Value Ref Range   Color, Urine YELLOW YELLOW   APPearance CLEAR CLEAR   Specific Gravity, Urine 1.021 1.005 - 1.030   pH 6.0 5.0 - 8.0   Glucose, UA NEGATIVE NEGATIVE mg/dL   Hgb urine dipstick NEGATIVE NEGATIVE   Bilirubin Urine NEGATIVE NEGATIVE   Ketones, ur 20 (A) NEGATIVE mg/dL   Protein, ur NEGATIVE NEGATIVE mg/dL   Nitrite NEGATIVE NEGATIVE   Leukocytes,Ua NEGATIVE NEGATIVE  Wet prep, genital     Status: Abnormal   Collection Time: 03/30/23  1:39 AM  Result Value Ref Range   Yeast Wet Prep HPF POC PRESENT (A) NONE SEEN   Trich, Wet Prep NONE SEEN NONE SEEN   Clue Cells Wet Prep HPF POC NONE SEEN NONE SEEN   WBC, Wet Prep HPF POC >=10 (A) <10   Sperm NONE SEEN     MDM - Cervix is long and thick, low suspicion for Preterm labor  - Wet prep positive for yeast.  - UA noted for 20 of ketones, PO fluids ordered  - Tylenol and flexeril ordered.  - Pain started to improve  spontaneously prior to analgesia.  - Plan for discharge.   Assessment and Plan   1. Yeast infection   2. Back pain, unspecified back location, unspecified back pain laterality, unspecified chronicity   3. Pelvic pain   4. [redacted] weeks gestation of pregnancy    - Reviewed that vaginal infections like yeast can cause abdominal cramping and discomfort.  - Rx for terazole and  Flexeril sent to outpatient pharmacy.  - Recommended the use of a maternity support belt.  - Reviewed worsening signs and return precautions reviewed.  - FHT appropriate for gestational age at time of discharge.  - Patient discharged home in stable condition and may return to MAU as needed.   Claudette Head, MSN CNM  03/30/2023, 12:43 AM

## 2023-03-31 ENCOUNTER — Other Ambulatory Visit: Payer: Self-pay | Admitting: Certified Nurse Midwife

## 2023-03-31 DIAGNOSIS — B3731 Acute candidiasis of vulva and vagina: Secondary | ICD-10-CM

## 2023-03-31 IMAGING — CT CT ANGIO CHEST
2 of 6 series · 18 of 46 positions shown · IV contrast (agent unspecified)
Comparison: None.

CLINICAL DATA: Shortness of breath, postpartum.

EXAM:
CT ANGIOGRAPHY CHEST WITH CONTRAST
TECHNIQUE: Multidetector CT imaging of the chest was performed using the
standard protocol during bolus administration of intravenous
contrast. Multiplanar CT image reconstructions and MIPs were
obtained to evaluate the vascular anatomy.

[Series 7: thins · axial · 0.71mm/px · z∈[-273,-63]mm · 15 of 230 slices shown]
[im 10/230  lung]
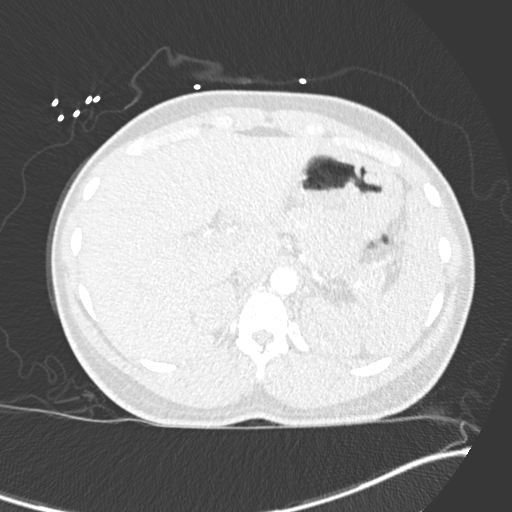
[im 30/230  soft-tissue]
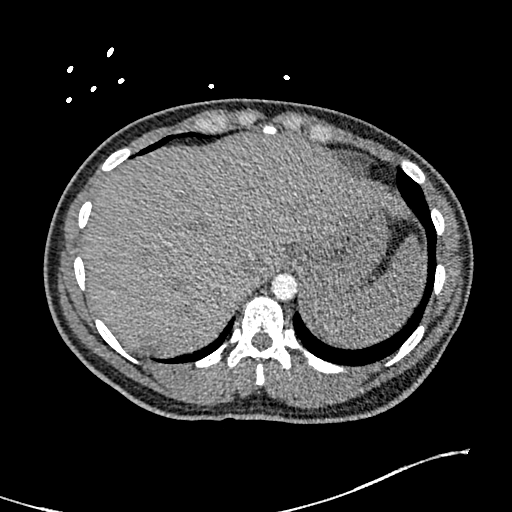
[im 40/230  lung]
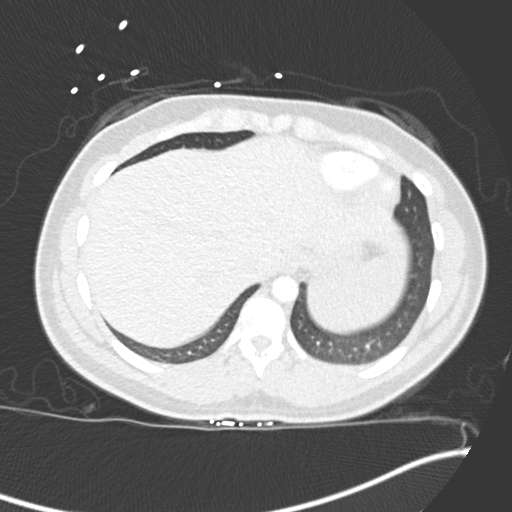
[im 60/230  soft-tissue]
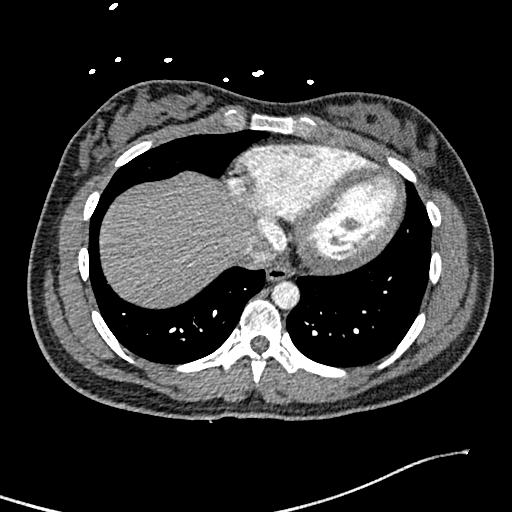
[im 70/230  lung]
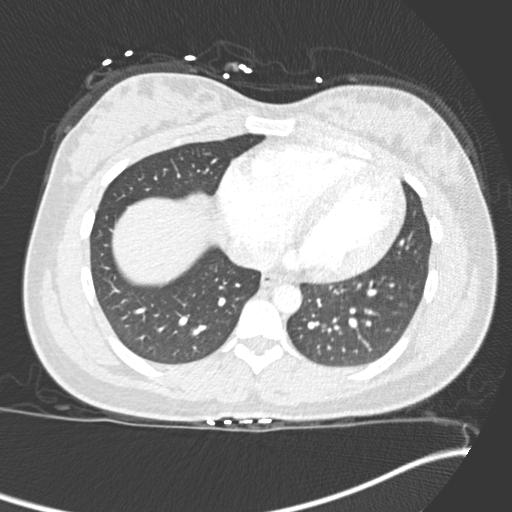
[im 90/230  soft-tissue]
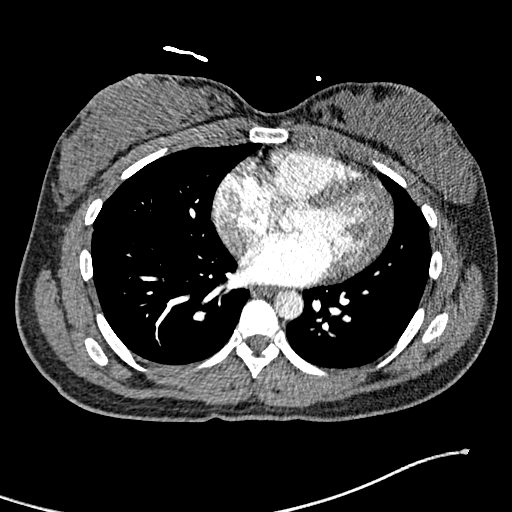
[im 100/230  lung]
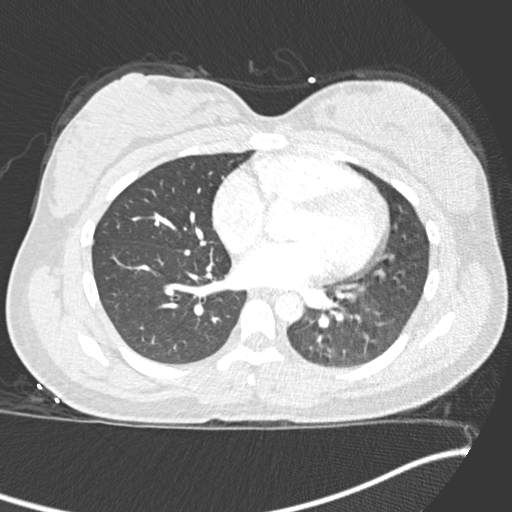
[im 120/230  soft-tissue]
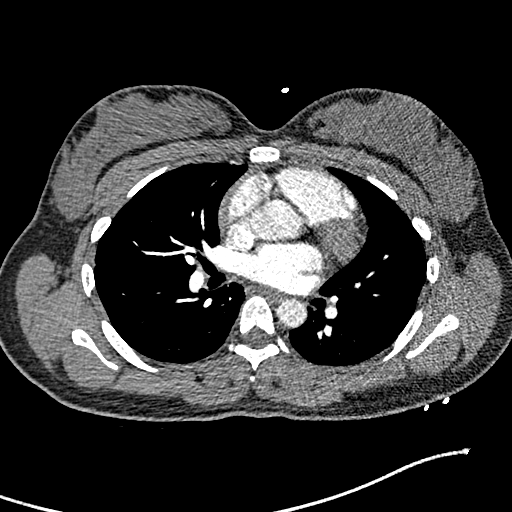
[im 130/230  lung]
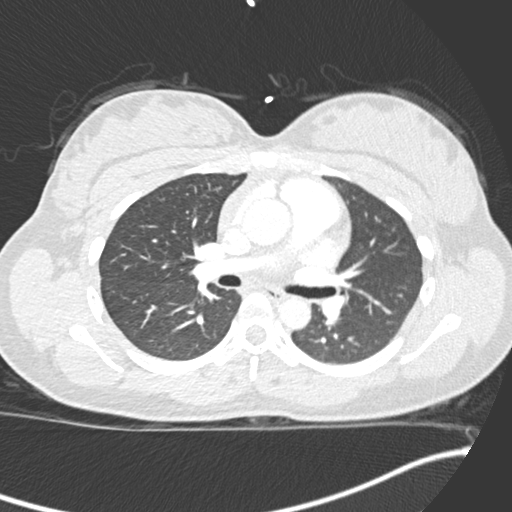
[im 140/230  soft-tissue]
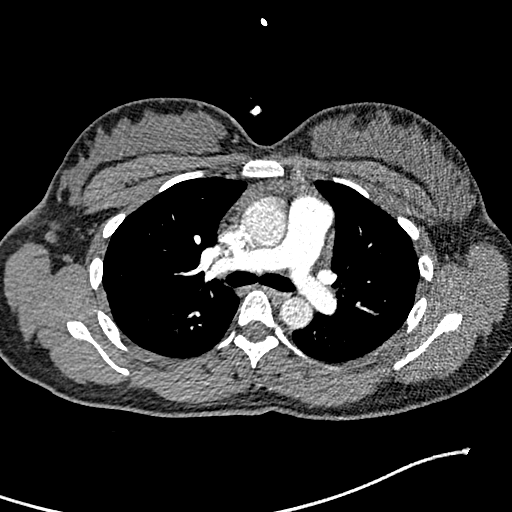
[im 160/230  lung]
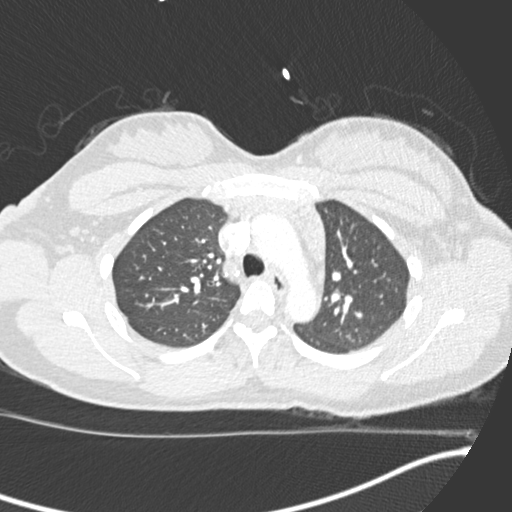
[im 170/230  soft-tissue]
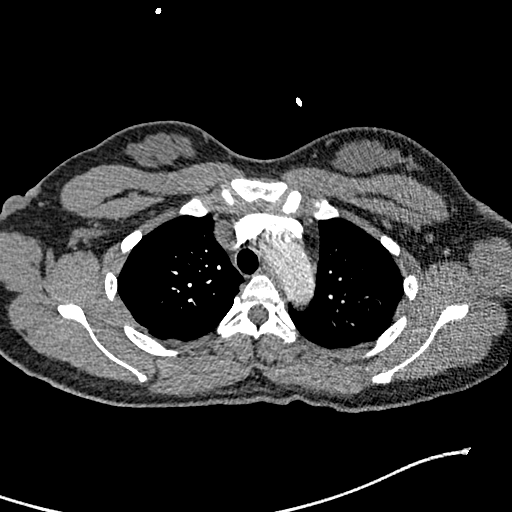
[im 190/230  lung]
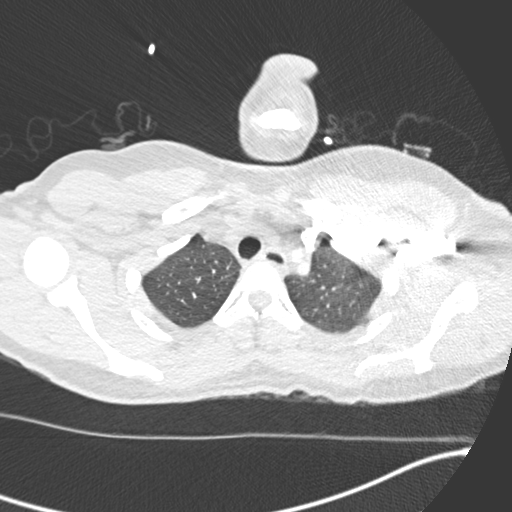
[im 200/230  soft-tissue]
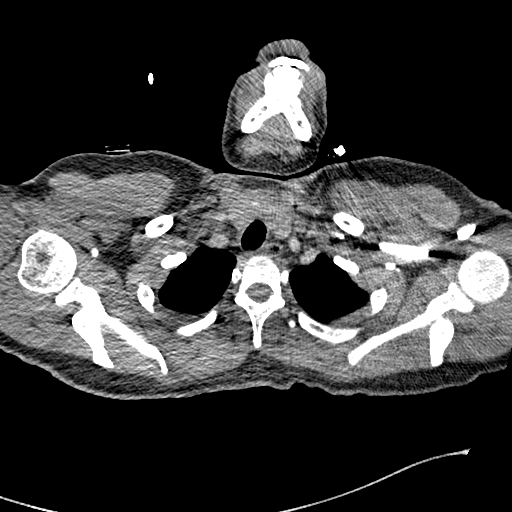
[im 220/230  lung]
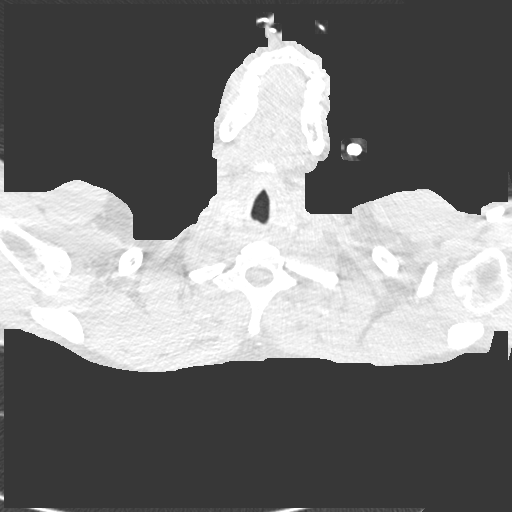

[Series 8: coronal mpr · coronal · 0.45mm/px · 3 of 127 slices shown]
[im 32/127  soft-tissue]
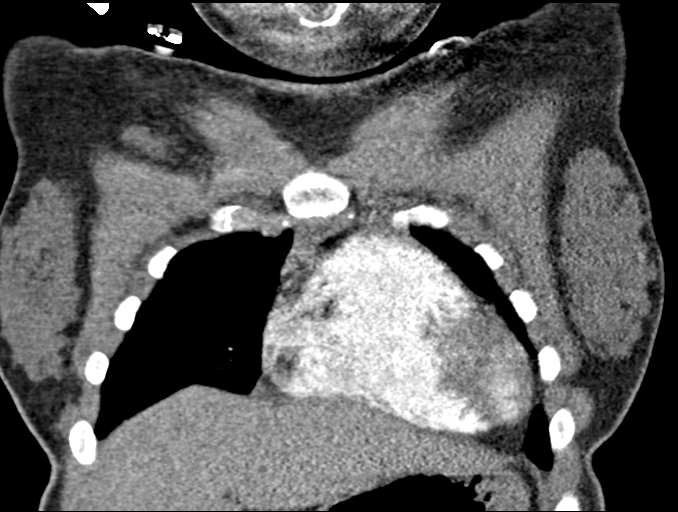
[im 64/127  soft-tissue]
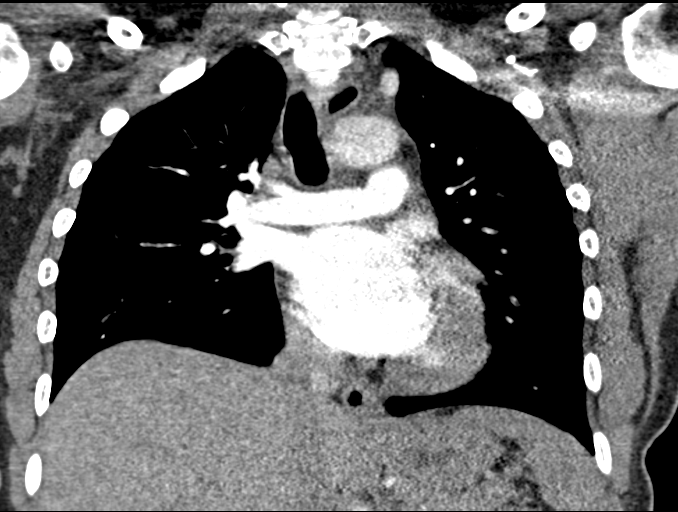
[im 95/127  soft-tissue]
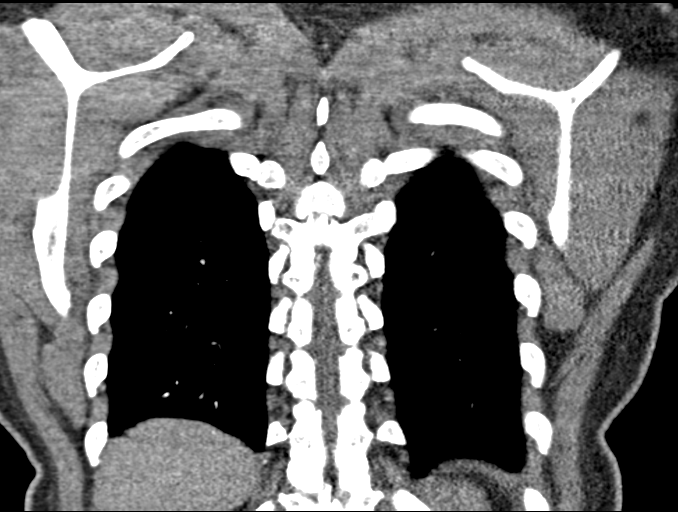

[18 of 46 positions shown; findings below may reference images not displayed]

RADIATION DOSE REDUCTION: This exam was performed according to the
departmental dose-optimization program which includes automated
exposure control, adjustment of the mA and/or kV according to
patient size and/or use of iterative reconstruction technique.

CONTRAST:  40mL OMNIPAQUE IOHEXOL 350 MG/ML SOLN
FINDINGS: Cardiovascular: No filling defects in the pulmonary arteries to
suggest pulmonary emboli. Heart is normal size. Aorta is normal
caliber.

Mediastinum/Nodes: No mediastinal, hilar, or axillary adenopathy.
Trachea and esophagus are unremarkable. Thyroid unremarkable.

Lungs/Pleura: Lungs are clear. No focal airspace opacities or
suspicious nodules. No effusions.

Upper Abdomen: No acute findings

Musculoskeletal: Chest wall soft tissues are unremarkable. No acute
bony abnormality.

Review of the MIP images confirms the above findings.
IMPRESSION: No evidence of pulmonary embolus.

No acute cardiopulmonary disease.

## 2023-03-31 MED ORDER — FLUCONAZOLE 150 MG PO TABS
150.0000 mg | ORAL_TABLET | Freq: Every day | ORAL | 1 refills | Status: DC
Start: 2023-03-31 — End: 2023-04-04

## 2023-04-04 ENCOUNTER — Other Ambulatory Visit: Payer: Self-pay

## 2023-04-04 ENCOUNTER — Ambulatory Visit (INDEPENDENT_AMBULATORY_CARE_PROVIDER_SITE_OTHER): Payer: Medicaid Other | Admitting: Certified Nurse Midwife

## 2023-04-04 VITALS — BP 113/75 | HR 77 | Wt 214.5 lb

## 2023-04-04 DIAGNOSIS — F5105 Insomnia due to other mental disorder: Secondary | ICD-10-CM

## 2023-04-04 DIAGNOSIS — O0993 Supervision of high risk pregnancy, unspecified, third trimester: Secondary | ICD-10-CM

## 2023-04-04 DIAGNOSIS — Z3A35 35 weeks gestation of pregnancy: Secondary | ICD-10-CM

## 2023-04-04 DIAGNOSIS — F409 Phobic anxiety disorder, unspecified: Secondary | ICD-10-CM

## 2023-04-04 DIAGNOSIS — O10913 Unspecified pre-existing hypertension complicating pregnancy, third trimester: Secondary | ICD-10-CM

## 2023-04-04 DIAGNOSIS — O10919 Unspecified pre-existing hypertension complicating pregnancy, unspecified trimester: Secondary | ICD-10-CM

## 2023-04-04 DIAGNOSIS — Z23 Encounter for immunization: Secondary | ICD-10-CM | POA: Diagnosis not present

## 2023-04-04 MED ORDER — HYDROXYZINE HCL 50 MG PO TABS
50.0000 mg | ORAL_TABLET | Freq: Three times a day (TID) | ORAL | 0 refills | Status: DC | PRN
Start: 2023-04-04 — End: 2023-06-13

## 2023-04-05 NOTE — Progress Notes (Signed)
   PRENATAL VISIT NOTE  Subjective:  Brandi [name] is a 27 y.o. G3P1011 at [redacted]w[redacted]d being seen today for ongoing prenatal care.  She is currently monitored for the following issues for this low-risk pregnancy and has Diastasis of rectus abdominis; Hernia of anterior abdominal wall; Generalized anxiety disorder; Migraine; PCOS (polycystic ovarian syndrome); Supervision of high risk pregnancy, antepartum; Chronic hypertension affecting pregnancy; and Obesity affecting pregnancy on their problem list.  Patient reports  no physical complaints other than insomnia due to recent unexpected loss of her mother . Feeling very anxious and having intrusive thoughts/nightmares. Desperately needing to sleep but denies SI. Contractions: Irritability. Vag. Bleeding: None.  Movement: Present. Denies leaking of fluid.   The following portions of the patient's history were reviewed and updated as appropriate: allergies, current medications, past family history, past medical history, past social history, past surgical history and problem list.   Objective:   Vitals:   04/04/23 1701  BP: 113/75  Pulse: 77  Weight: 214 lb 8 oz (97.3 kg)   Fetal Status: Fetal Heart Rate (bpm): 130 Fundal Height: 35 cm Movement: Present  Presentation: Vertex  General:  Alert, oriented and cooperative. Patient is in no acute distress.  Skin: Skin is warm and dry. No rash noted.   Cardiovascular: Normal heart rate noted  Respiratory: Normal respiratory effort, no problems with respiration noted  Abdomen: Soft, gravid, appropriate for gestational age.  Pain/Pressure: Present     Pelvic: Cervical exam deferred        Extremities: Normal range of motion.  Edema: None  Mental Status: Normal mood and affect. Normal behavior. Normal judgment and thought content.   Assessment and Plan:  Pregnancy: G3P1011 at [redacted]w[redacted]d 1. Supervision of high risk pregnancy in third trimester - Doing well, feeling regular and vigorous fetal movement   - Tdap vaccine greater than or equal to 7yo IM  2. [redacted] weeks gestation of pregnancy - Routine OB care including anticipatory guidance re GBS testing at next visit   3. Chronic hypertension affecting pregnancy - Stable on no meds, did not tolerate labetalol well. Taking aspirin  4. Insomnia due to anxiety and fear - Much encouragement and reassurance given, let her verbally process some of the trauma she experienced. Validated her intuition and feelings about the loss and encouraged her to try at least hydroxyzine for sleep. Strongly advised sending a message if she tries it and it does not allow her to sleep. - hydrOXYzine (ATARAX) 50 MG tablet; Take 1 tablet (50 mg total) by mouth 3 (three) times daily as needed.  Dispense: 30 tablet; Refill: 0  Preterm labor symptoms and general obstetric precautions including but not limited to vaginal bleeding, contractions, leaking of fluid and fetal movement were reviewed in detail with the patient. Please refer to After Visit Summary for other counseling recommendations.   Return in about 1 week (around 04/11/2023) for IN-PERSON, LOB.  Future Appointments  Date Time Provider Department Center  04/18/2023  4:45 PM Osborne Oman [facility]  04/20/2023  8:30 AM [facility] US[facility]  05/02/2023  4:45 PM Bernerd Limbo, CNM [facility]    Bernerd Limbo, CNM

## 2023-04-06 ENCOUNTER — Ambulatory Visit: Payer: Self-pay

## 2023-04-17 DIAGNOSIS — F4321 Adjustment disorder with depressed mood: Secondary | ICD-10-CM | POA: Insufficient documentation

## 2023-04-17 HISTORY — DX: Adjustment disorder with depressed mood: F43.21

## 2023-04-17 NOTE — Progress Notes (Signed)
   PRENATAL VISIT NOTE  Subjective:  Brandi [name] is a 27 y.o. G3P1011 at [redacted]w[redacted]d being seen today for ongoing prenatal care.  She is currently monitored for the following issues for this high-risk pregnancy and has Diastasis of rectus abdominis; Hernia of anterior abdominal wall; Generalized anxiety disorder; Migraine; PCOS (polycystic ovarian syndrome); Supervision of high-risk pregnancy; Chronic hypertension affecting pregnancy; Obesity affecting pregnancy; and Complicated grief on their problem list.  Patient reports  feeling much better, has been able to sleep much better with addition of vistaril. Feeling overall well, still processing her grief .  Contractions: Not present. Vag. Bleeding: None.  Movement: Present. Denies leaking of fluid.   The following portions of the patient's history were reviewed and updated as appropriate: allergies, current medications, past family history, past medical history, past social history, past surgical history and problem list.   Objective:   Vitals:   04/18/23 1714  BP: 123/77  Pulse: 73  Weight: 221 lb 4.8 oz (100.4 kg)   Fetal Status: Fetal Heart Rate (bpm): 138 Fundal Height: 37 cm Movement: Present  Presentation: Vertex  General:  Alert, oriented and cooperative. Patient is in no acute distress.  Skin: Skin is warm and dry. No rash noted.   Cardiovascular: Normal heart rate noted  Respiratory: Normal respiratory effort, no problems with respiration noted  Abdomen: Soft, gravid, appropriate for gestational age.  Pain/Pressure: Present     Pelvic: Cervical exam deferred        Extremities: Normal range of motion.  Edema: None  Mental Status: Normal mood and affect. Normal behavior. Normal judgment and thought content.   Assessment and Plan:  Pregnancy: G3P1011 at [redacted]w[redacted]d 1. Supervision of high risk pregnancy in third trimester - Doing well, feeling regular and vigorous fetal movement   2. [redacted] weeks gestation of pregnancy - Routine OB  care   3. Chronic hypertension affecting pregnancy - Stable on no meds, selected 11/1 for 39-40wk IOL  4. Complicated grief - Much improved, will continue taking vistaril as needed. Erasmo Score has finally happened which is allowing her some emotional space to recover.  Term labor symptoms and general obstetric precautions including but not limited to vaginal bleeding, contractions, leaking of fluid and fetal movement were reviewed in detail with the patient. Please refer to After Visit Summary for other counseling recommendations.   Return in about 1 week (around 04/25/2023) for IN-PERSON, LOB.  Future Appointments  Date Time Provider Department Center  05/02/2023  4:45 PM Osborne Oman [facility]  05/04/2023 12:00 AM [facility] SCHED ROOM [facility] None    Bernerd Limbo, CNM

## 2023-04-18 ENCOUNTER — Other Ambulatory Visit: Payer: Self-pay

## 2023-04-18 ENCOUNTER — Ambulatory Visit (INDEPENDENT_AMBULATORY_CARE_PROVIDER_SITE_OTHER): Payer: Medicaid Other | Admitting: Certified Nurse Midwife

## 2023-04-18 VITALS — BP 123/77 | HR 73 | Wt 221.3 lb

## 2023-04-18 DIAGNOSIS — O0993 Supervision of high risk pregnancy, unspecified, third trimester: Secondary | ICD-10-CM

## 2023-04-18 DIAGNOSIS — Z3A37 37 weeks gestation of pregnancy: Secondary | ICD-10-CM

## 2023-04-18 DIAGNOSIS — O10919 Unspecified pre-existing hypertension complicating pregnancy, unspecified trimester: Secondary | ICD-10-CM

## 2023-04-18 DIAGNOSIS — F4321 Adjustment disorder with depressed mood: Secondary | ICD-10-CM

## 2023-04-18 DIAGNOSIS — O10913 Unspecified pre-existing hypertension complicating pregnancy, third trimester: Secondary | ICD-10-CM | POA: Diagnosis not present

## 2023-04-19 LAB — POCT URINALYSIS DIP (DEVICE)
Bilirubin Urine: NEGATIVE
Glucose, UA: NEGATIVE mg/dL
Hgb urine dipstick: NEGATIVE
Ketones, ur: NEGATIVE mg/dL
Nitrite: NEGATIVE
Protein, ur: NEGATIVE mg/dL
Specific Gravity, Urine: 1.025 (ref 1.005–1.030)
Urobilinogen, UA: 0.2 mg/dL (ref 0.0–1.0)
pH: 7 (ref 5.0–8.0)

## 2023-04-20 ENCOUNTER — Ambulatory Visit: Payer: Medicaid Other | Attending: Maternal & Fetal Medicine

## 2023-04-20 ENCOUNTER — Other Ambulatory Visit: Payer: Self-pay

## 2023-04-20 VITALS — BP 129/75

## 2023-04-20 DIAGNOSIS — E669 Obesity, unspecified: Secondary | ICD-10-CM | POA: Diagnosis not present

## 2023-04-20 DIAGNOSIS — O10013 Pre-existing essential hypertension complicating pregnancy, third trimester: Secondary | ICD-10-CM | POA: Diagnosis not present

## 2023-04-20 DIAGNOSIS — Z3A37 37 weeks gestation of pregnancy: Secondary | ICD-10-CM

## 2023-04-20 DIAGNOSIS — O10913 Unspecified pre-existing hypertension complicating pregnancy, third trimester: Secondary | ICD-10-CM | POA: Diagnosis present

## 2023-04-20 DIAGNOSIS — O99213 Obesity complicating pregnancy, third trimester: Secondary | ICD-10-CM

## 2023-04-20 DIAGNOSIS — O0993 Supervision of high risk pregnancy, unspecified, third trimester: Secondary | ICD-10-CM | POA: Diagnosis present

## 2023-04-23 LAB — CULTURE, BETA STREP (GROUP B ONLY): Strep Gp B Culture: NEGATIVE

## 2023-04-25 ENCOUNTER — Telehealth: Payer: Self-pay

## 2023-04-26 ENCOUNTER — Other Ambulatory Visit: Payer: Self-pay

## 2023-04-26 ENCOUNTER — Inpatient Hospital Stay (HOSPITAL_COMMUNITY)
Admission: AD | Admit: 2023-04-26 | Discharge: 2023-04-28 | DRG: 806 | Disposition: A | Discharge status: Home / Self Care | Payer: Medicaid Other | Attending: Obstetrics & Gynecology | Admitting: Obstetrics & Gynecology

## 2023-04-26 ENCOUNTER — Encounter (HOSPITAL_COMMUNITY): Payer: Self-pay | Admitting: Obstetrics & Gynecology

## 2023-04-26 ENCOUNTER — Telehealth: Payer: Self-pay | Admitting: Nurse Practitioner

## 2023-04-26 DIAGNOSIS — Z3A38 38 weeks gestation of pregnancy: Secondary | ICD-10-CM | POA: Diagnosis not present

## 2023-04-26 DIAGNOSIS — Z8616 Personal history of COVID-19: Secondary | ICD-10-CM

## 2023-04-26 DIAGNOSIS — O133 Gestational [pregnancy-induced] hypertension without significant proteinuria, third trimester: Principal | ICD-10-CM

## 2023-04-26 DIAGNOSIS — Z823 Family history of stroke: Secondary | ICD-10-CM | POA: Diagnosis not present

## 2023-04-26 DIAGNOSIS — O99344 Other mental disorders complicating childbirth: Secondary | ICD-10-CM | POA: Diagnosis present

## 2023-04-26 DIAGNOSIS — O10919 Unspecified pre-existing hypertension complicating pregnancy, unspecified trimester: Secondary | ICD-10-CM | POA: Diagnosis present

## 2023-04-26 DIAGNOSIS — A6000 Herpesviral infection of urogenital system, unspecified: Secondary | ICD-10-CM | POA: Diagnosis present

## 2023-04-26 DIAGNOSIS — Z800 Family history of malignant neoplasm of digestive organs: Secondary | ICD-10-CM

## 2023-04-26 DIAGNOSIS — O1092 Unspecified pre-existing hypertension complicating childbirth: Secondary | ICD-10-CM | POA: Diagnosis present

## 2023-04-26 DIAGNOSIS — O368130 Decreased fetal movements, third trimester, not applicable or unspecified: Secondary | ICD-10-CM | POA: Diagnosis present

## 2023-04-26 DIAGNOSIS — O99214 Obesity complicating childbirth: Secondary | ICD-10-CM | POA: Diagnosis present

## 2023-04-26 DIAGNOSIS — O9832 Other infections with a predominantly sexual mode of transmission complicating childbirth: Secondary | ICD-10-CM | POA: Diagnosis present

## 2023-04-26 DIAGNOSIS — Z818 Family history of other mental and behavioral disorders: Secondary | ICD-10-CM

## 2023-04-26 DIAGNOSIS — Z560 Unemployment, unspecified: Secondary | ICD-10-CM

## 2023-04-26 DIAGNOSIS — Z801 Family history of malignant neoplasm of trachea, bronchus and lung: Secondary | ICD-10-CM

## 2023-04-26 DIAGNOSIS — Z87891 Personal history of nicotine dependence: Secondary | ICD-10-CM | POA: Diagnosis not present

## 2023-04-26 DIAGNOSIS — F411 Generalized anxiety disorder: Secondary | ICD-10-CM | POA: Diagnosis present

## 2023-04-26 DIAGNOSIS — O134 Gestational [pregnancy-induced] hypertension without significant proteinuria, complicating childbirth: Secondary | ICD-10-CM | POA: Diagnosis not present

## 2023-04-26 DIAGNOSIS — O0990 Supervision of high risk pregnancy, unspecified, unspecified trimester: Secondary | ICD-10-CM

## 2023-04-26 DIAGNOSIS — Z7982 Long term (current) use of aspirin: Secondary | ICD-10-CM

## 2023-04-26 DIAGNOSIS — O99210 Obesity complicating pregnancy, unspecified trimester: Secondary | ICD-10-CM | POA: Diagnosis present

## 2023-04-26 DIAGNOSIS — O368131 Decreased fetal movements, third trimester, fetus 1: Secondary | ICD-10-CM

## 2023-04-26 DIAGNOSIS — Z8249 Family history of ischemic heart disease and other diseases of the circulatory system: Secondary | ICD-10-CM

## 2023-04-26 DIAGNOSIS — I10 Essential (primary) hypertension: Secondary | ICD-10-CM | POA: Diagnosis present

## 2023-04-26 LAB — CBC
HCT: 36.8 % (ref 36.0–46.0)
Hemoglobin: 12.6 g/dL (ref 12.0–15.0)
MCH: 29.1 pg (ref 26.0–34.0)
MCHC: 34.2 g/dL (ref 30.0–36.0)
MCV: 85 fL (ref 80.0–100.0)
Platelets: 207 10*3/uL (ref 150–400)
RBC: 4.33 MIL/uL (ref 3.87–5.11)
RDW: 13.5 % (ref 11.5–15.5)
WBC: 8.3 10*3/uL (ref 4.0–10.5)
nRBC: 0 % (ref 0.0–0.2)

## 2023-04-26 LAB — COMPREHENSIVE METABOLIC PANEL
ALT: 14 U/L (ref 0–44)
AST: 22 U/L (ref 15–41)
Albumin: 3 g/dL — ABNORMAL LOW (ref 3.5–5.0)
Alkaline Phosphatase: 62 U/L (ref 38–126)
Anion gap: 13 (ref 5–15)
BUN: 7 mg/dL (ref 6–20)
CO2: 19 mmol/L — ABNORMAL LOW (ref 22–32)
Calcium: 9.3 mg/dL (ref 8.9–10.3)
Chloride: 105 mmol/L (ref 98–111)
Creatinine, Ser: 0.76 mg/dL (ref 0.44–1.00)
GFR, Estimated: >60 mL/min (ref >60)
Glucose, Bld: 107 mg/dL — ABNORMAL HIGH (ref 70–99)
Potassium: 3.8 mmol/L (ref 3.5–5.1)
Sodium: 137 mmol/L (ref 135–145)
Total Bilirubin: 0.8 mg/dL (ref 0.3–1.2)
Total Protein: 6.4 g/dL — ABNORMAL LOW (ref 6.5–8.1)

## 2023-04-26 LAB — TYPE AND SCREEN
ABO/RH(D): O POS
Antibody Screen: NEGATIVE

## 2023-04-26 LAB — PROTEIN / CREATININE RATIO, URINE
Creatinine, Urine: 218 mg/dL
Protein Creatinine Ratio: 0.06 mg/mg{creat} (ref 0.00–0.15)
Total Protein, Urine: 14 mg/dL

## 2023-04-26 MED ORDER — LACTATED RINGERS IV SOLN: INTRAVENOUS | Status: DC

## 2023-04-26 MED ORDER — MISOPROSTOL 25 MCG QUARTER TABLET
25.0000 ug | ORAL_TABLET | Freq: Once | ORAL | Status: AC
  Administered 2023-04-27: 25 ug via VAGINAL
  Filled 2023-04-26: qty 1

## 2023-04-26 MED ORDER — MISOPROSTOL 50MCG HALF TABLET
50.0000 ug | ORAL_TABLET | Freq: Once | ORAL | Status: AC
  Administered 2023-04-27: 50 ug via ORAL
  Filled 2023-04-26: qty 1

## 2023-04-26 MED ORDER — TERBUTALINE SULFATE 1 MG/ML IJ SOLN: 0.2500 mg | Freq: Once | INTRAMUSCULAR | Status: DC | PRN

## 2023-04-26 MED ORDER — HYDROXYZINE HCL 25 MG PO TABS
50.0000 mg | ORAL_TABLET | Freq: Three times a day (TID) | ORAL | Status: DC | PRN
  Administered 2023-04-26: 50 mg via ORAL
  Filled 2023-04-26: qty 1

## 2023-04-26 MED ORDER — ONDANSETRON HCL 4 MG/2ML IJ SOLN
4.0000 mg | Freq: Four times a day (QID) | INTRAMUSCULAR | Status: DC | PRN
Start: 2023-04-26 — End: 2023-04-27

## 2023-04-26 MED ORDER — OXYTOCIN BOLUS FROM INFUSION
333.0000 mL | Freq: Once | INTRAVENOUS | Status: AC
  Administered 2023-04-27: 333 mL via INTRAVENOUS

## 2023-04-26 MED ORDER — OXYCODONE-ACETAMINOPHEN 5-325 MG PO TABS: 2.0000 | ORAL_TABLET | ORAL | Status: DC | PRN

## 2023-04-26 MED ORDER — OXYCODONE-ACETAMINOPHEN 5-325 MG PO TABS
1.0000 | ORAL_TABLET | ORAL | Status: DC | PRN
Start: 2023-04-26 — End: 2023-04-27

## 2023-04-26 MED ORDER — ACETAMINOPHEN 325 MG PO TABS: 650.0000 mg | ORAL_TABLET | ORAL | Status: DC | PRN

## 2023-04-26 MED ORDER — OXYTOCIN-SODIUM CHLORIDE 30-0.9 UT/500ML-% IV SOLN
2.5000 [IU]/h | INTRAVENOUS | Status: DC
  Administered 2023-04-27: 2.5 [IU]/h via INTRAVENOUS
  Filled 2023-04-26: qty 500

## 2023-04-26 MED ORDER — SOD CITRATE-CITRIC ACID 500-334 MG/5ML PO SOLN: 30.0000 mL | ORAL | Status: DC | PRN

## 2023-04-26 MED ORDER — FLEET ENEMA RE ENEM: 1.0000 | ENEMA | RECTAL | Status: DC | PRN

## 2023-04-26 MED ORDER — LACTATED RINGERS IV SOLN: 500.0000 mL | INTRAVENOUS | Status: DC | PRN

## 2023-04-26 MED ORDER — FENTANYL CITRATE (PF) 100 MCG/2ML IJ SOLN
100.0000 ug | INTRAMUSCULAR | Status: DC | PRN
  Administered 2023-04-27: 100 ug via INTRAVENOUS
  Filled 2023-04-26: qty 2

## 2023-04-26 MED ORDER — LIDOCAINE HCL (PF) 1 % IJ SOLN: 30.0000 mL | INTRAMUSCULAR | Status: DC | PRN

## 2023-04-26 NOTE — MAU Provider Note (Signed)
Chief Complaint  Patient presents with   Contractions     Event Date/Time   First Provider Initiated Contact with Patient 04/26/23 2048      S: Brandi [name]  is a 27 y.o. y.o. year old G70P1011 female at [redacted]w[redacted]d weeks gestation who presents to MAU reporting decreased fetal movement and contractions.    Contractions: Mild-mod Vaginal bleeding: Denies Leaking of fluid: Denies  Feels a HA starting. HX chronic HA's. Started seeing some spots upon arrival to MAU. Denies epigastric pain.   Per chart review pt has Hx CHTN, but pt does not seem to know when this was diagnosed. Was started on Labetalol 100 BID mid pregnancy but pt's understanding was that it was to help with HA's. Pt did not tolerate it and stopped taking it a few months ago. Nml BP off meds since then.    Patient Active Problem List   Diagnosis Date Noted   Complicated grief 04/17/2023   Obesity affecting pregnancy 03/20/2023   Chronic hypertension affecting pregnancy 02/06/2023   Supervision of high-risk pregnancy 01/19/2023   PCOS (polycystic ovarian syndrome) 09/21/2022   Generalized anxiety disorder 06/02/2022   Migraine 06/02/2022   Hernia of anterior abdominal wall 12/13/2021   Diastasis of rectus abdominis 11/15/2021    O: Patient Vitals for the past 24 hrs:  BP Temp Temp src Pulse Resp SpO2 Height Weight  04/26/23 2101 (!) 143/85 -- -- 93 -- -- -- --  04/26/23 2046 131/86 -- -- 91 -- -- -- --  04/26/23 2031 (!) 141/82 -- -- 87 -- -- -- --  04/26/23 2015 138/81 -- -- 84 -- -- -- --  04/26/23 2013 (!) 144/86 -- -- 83 -- -- -- --  04/26/23 1955 (!) 151/72 -- -- 84 -- -- -- --  04/26/23 1930 137/82 -- -- 90 -- -- -- --  04/26/23 1916 (!) 143/86 -- -- 89 -- -- -- --  04/26/23 1913 (!) 143/82 -- -- 86 -- -- -- --  04/26/23 1842 (!) 140/81 98.1 F (36.7 C) Oral 84 18 100 % 5\' 3"  (1.6 m) 100.8 kg   General: NAD Heart: Regular rate Lungs: Normal rate and effort Abd: Soft, NT, Gravid, S=D Pelvic: Per  RN Dilation: 2 Effacement (%): 70 Station: -2 Exam by:: Brandi Caprice, RN  NST performed EFM: 135, mod variability, 15x15 accels, no decels Toco: Irreg, mild  A: [redacted]w[redacted]d week IUP Decreased fetal movement continues with reactive NST. Exacerbation of CHTN. New vision changes since arrival to MAU. Nml Pre-E labs. Will have labor team re-eval and determine if Sx are persistent for Pre-E w/ severe features ana manage accordingly.   P: Admit to L&D per consult w/ Dr. Despina Hidden.  Routine labor admit orders. IOL per labor team.   Brandi [name], [place], CNM 04/26/2023 9:23 PM  2

## 2023-04-26 NOTE — Telephone Encounter (Signed)
Received fax from [employer] for Adult Pull on/disposable underwear  I called patient and she states that she did request these, she was informed by her mid wife that she could use these during her pregnancy and postpartum  Order sent back in folder

## 2023-04-26 NOTE — H&P (Signed)
LABOR AND DELIVERY ADMISSION HISTORY AND PHYSICAL NOTE  Brandi [name] is a 27 y.o. female G3P1011 with IUP at [redacted]w[redacted]d presenting for painful ctx with DFM. Patient found to have elevated Bps in [place] and decision was made to induce. Pregnancy c/b cHTN (not requiring meds), obesity. Did have mild preE with first pregnancy.  Patient reports the fetal movement as decreased . Patient reports uterine contraction  activity as irregular. Patient reports  vaginal bleeding as none. Patient describes fluid per vagina as None.   Patient denies headache, vision changes, chest pain, shortness of breath, right upper quadrant pain, or LE edema.  She plans on breast feeding feeding. Her contraception plan is:  unsure .  Prenatal History/Complications: PNC at [facility]:  @[redacted]w[redacted]d , CWD, normal anatomy, cephalic presentation, posterior placenta, 47%ile  Pregnancy complications:  Patient Active Problem List   Diagnosis Date Noted   Complicated grief 04/17/2023   Obesity affecting pregnancy 03/20/2023   Chronic hypertension affecting pregnancy 02/06/2023   Supervision of high-risk pregnancy 01/19/2023   PCOS (polycystic ovarian syndrome) 09/21/2022   Generalized anxiety disorder 06/02/2022   Migraine 06/02/2022   Hernia of anterior abdominal wall 12/13/2021   Diastasis of rectus abdominis 11/15/2021    Past Medical History: Past Medical History:  Diagnosis Date   Anxiety    COVID-19 virus infection 07/13/2021   Depression    HSV-1 infection    Preeclampsia 07/13/2021   PTSD (post-traumatic stress disorder)    Tinea corporis 01/11/2021   UTI (urinary tract infection)     Past Surgical History: Past Surgical History:  Procedure Laterality Date   HERNIA REPAIR  07/03/2006   HERNIA REPAIR  03/30/2022    Obstetrical History: OB History     Gravida  3   Para  1   Term  1   Preterm      AB  1   Living  1      SAB  1   IAB      Ectopic      Multiple  0   Live Births   1           Social History: Social History   Socioeconomic History   Marital status: Married    Spouse name: Not on file   Number of children: 1   Years of education: Not on file   Highest education level: Associate degree: academic program  Occupational History   Occupation: unemployed  Tobacco Use   Smoking status: Former    Current packs/day: 0.50    Types: Cigarettes    Passive exposure: Never   Smokeless tobacco: Never  Vaping Use   Vaping status: Never Used  Substance and Sexual Activity   Alcohol use: Not Currently    Comment: occasional prior to pregnancy   Drug use: Never   Sexual activity: Not Currently    Birth control/protection: None  Other Topics Concern   Not on file  Social History Narrative   Not on file   Social Determinants of Health   Financial Resource Strain: Not on file  Food Insecurity: No Food Insecurity (02/07/2023)   Hunger Vital Sign    Worried About Running Out of Food in the Last Year: Never true    Ran Out of Food in the Last Year: Never true  Transportation Needs: No Transportation Needs (02/07/2023)   PRAPARE - Administrator, Civil Service (Medical): No    Lack of Transportation (Non-Medical): No  Physical Activity: Not on file  Stress: Not on file  Social Connections: Not on file    Family History: Family History  Problem Relation Age of Onset   Heart disease Mother    Stroke Mother    Hypertension Mother    Bipolar disorder Mother    Healthy Father    Anxiety disorder Sister    Depression Sister    Hypertension Sister    Hypertension Maternal Grandmother    Lung cancer Maternal Grandmother    Stroke Maternal Grandfather    Colon cancer Maternal Grandfather    Depression Paternal Grandmother    Hypertension Paternal Grandmother     Allergies: No Known Allergies  Medications Prior to Admission  Medication Sig Dispense Refill Last Dose   aspirin EC 81 MG tablet Take 2 tablets (162 mg total) by mouth  daily after breakfast. Swallow whole. 60 tablet 6 04/25/2023 at 2100   Cholecalciferol (VITAMIN D) 125 MCG (5000 UT) CAPS Take 1 capsule by mouth daily after breakfast. 30 capsule 11 04/25/2023 at 2100   hydrOXYzine (ATARAX) 50 MG tablet Take 1 tablet (50 mg total) by mouth 3 (three) times daily as needed. 30 tablet 0 04/25/2023 at 2100   Prenatal Vit-Fe Fumarate-FA (PRENATAL MULTIVITAMIN) TABS tablet Take 1 tablet by mouth daily at 12 noon.   04/25/2023 at 2100   cyclobenzaprine (FLEXERIL) 5 MG tablet Take 1 tablet (5 mg total) by mouth 3 (three) times daily as needed for muscle spasms. (Patient not taking: Reported on 04/18/2023) 20 tablet 0    sertraline (ZOLOFT) 25 MG tablet Take 1 tablet (25 mg total) by mouth daily after supper. (Patient not taking: Reported on 03/21/2023) 30 tablet 6      Review of Systems  All systems reviewed and negative except as stated in HPI  Physical Exam BP 138/79   Pulse 79   Temp 98.1 F (36.7 C) (Oral)   Resp 18   Ht 5\' 3"  (1.6 m)   Wt 100.8 kg   LMP 08/06/2022 (Approximate)   SpO2 100%   BMI 39.38 kg/m   Physical Exam Constitutional:      General: She is not in acute distress.    Appearance: She is not ill-appearing.  Cardiovascular:     Rate and Rhythm: Normal rate and regular rhythm.  Pulmonary:     Effort: Pulmonary effort is normal.  Abdominal:     Comments: Gravid  Musculoskeletal:        General: No swelling.  Skin:    General: Skin is warm and dry.  Neurological:     General: No focal deficit present.  Psychiatric:        Mood and Affect: Mood normal.  Presentation: cephalic by check  Fetal monitoring: Baseline: 130 bpm, Variability: Good {> 6 bpm), Accelerations: Reactive, and Decelerations: Absent Uterine activity: irregular  Dilation: 2 Effacement (%): 70 Station: -2 Exam by:: Isabel Caprice, RN  Prenatal labs: ABO, Rh: O/Positive/-- (04/12 0000) Antibody: Negative (04/12 0000) Rubella: Immune (04/12 0000) RPR: Non  Reactive (08/16 0829)  HBsAg: Negative (04/12 0000)  HIV: Non Reactive (08/16 0829)  GC/Chlamydia:  Neisseria Gonorrhea  Date Value Ref Range Status  03/30/2023 Negative  Final   Chlamydia  Date Value Ref Range Status  03/30/2023 Negative  Final   GBS: Negative/-- (10/17 0816)   Prenatal Transfer Tool  Maternal Diabetes: No Genetic Screening: Normal Maternal Ultrasounds/Referrals: Normal Fetal Ultrasounds or other Referrals:  None Maternal Substance Abuse:  No Significant Maternal Medications: ASA Significant Maternal Lab Results: Group B  Strep negative  Results for orders placed or performed during the hospital encounter of 04/26/23 (from the past 24 hour(s))  Protein / creatinine ratio, urine   Collection Time: 04/26/23  6:52 PM  Result Value Ref Range   Creatinine, Urine 218 mg/dL   Total Protein, Urine 14 mg/dL   Protein Creatinine Ratio 0.06 0.00 - 0.15 mg/mg[Cre]  CBC   Collection Time: 04/26/23  7:58 PM  Result Value Ref Range   WBC 8.3 4.0 - 10.5 K/uL   RBC 4.33 3.87 - 5.11 MIL/uL   Hemoglobin 12.6 12.0 - 15.0 g/dL   HCT 08.6 57.8 - 46.9 %   MCV 85.0 80.0 - 100.0 fL   MCH 29.1 26.0 - 34.0 pg   MCHC 34.2 30.0 - 36.0 g/dL   RDW 62.9 52.8 - 41.3 %   Platelets 207 150 - 400 K/uL   nRBC 0.0 0.0 - 0.2 %  Comprehensive metabolic panel   Collection Time: 04/26/23  7:58 PM  Result Value Ref Range   Sodium 137 135 - 145 mmol/L   Potassium 3.8 3.5 - 5.1 mmol/L   Chloride 105 98 - 111 mmol/L   CO2 19 (L) 22 - 32 mmol/L   Glucose, Bld 107 (H) 70 - 99 mg/dL   BUN 7 6 - 20 mg/dL   Creatinine, Ser 2.44 0.44 - 1.00 mg/dL   Calcium 9.3 8.9 - 01.0 mg/dL   Total Protein 6.4 (L) 6.5 - 8.1 g/dL   Albumin 3.0 (L) 3.5 - 5.0 g/dL   AST 22 15 - 41 U/L   ALT 14 0 - 44 U/L   Alkaline Phosphatase 62 38 - 126 U/L   Total Bilirubin 0.8 0.3 - 1.2 mg/dL   GFR, Estimated >27 >25 mL/min   Anion gap 13 5 - 15    Assessment: Brandi [name] is a 27 y.o. G3P1011 at [redacted]w[redacted]d here  for DFM, elevated Bps, likely latent labor.  #Labor: Dual cytotec followed by AROM, pit PRN #Pain: IV pain meds PRN, epidural upon request #FHT: Category I #GBS/ID: Negative #MOF: breast feeding #MOC:  unsure #Circ: Yes  #cHTN: Has dx of cHTN at beginning of pregnancy but has had wnl Bps are prenatal visits and not required medications. At this time, seems to be developing a gestational component. Given this and DFM, decision made to induce. Reassuringly normal preE labs without symptoms  [name], MD [facility] Fellow Center for [facility], [facility]  04/26/2023, 9:40 PM

## 2023-04-26 NOTE — MAU Note (Addendum)
Brandi [name] is a 26 y.o. at [redacted]w[redacted]d here in MAU reporting: started contracting around 0100, felt like mild period cramps. Woke her up.  Continued throughout the day. Have gotten stronger, but unsure how often. Onset of complaint: 0100. Frequent urge for bm and urination. .increase in pelvic pressure. Lower back pain.   Might have lost mucous plug.  Still feeling the baby moving, but is less.  No leaking or bleeding.  Pain score: 8 Vitals:   04/26/23 1842  BP: (!) 140/81  Pulse: 84  Resp: 18  Temp: 98.1 F (36.7 C)  SpO2: 100%     FHT:160 Lab orders placed from triage:  urine collected   Denies HA, visual changes, or epigastric pain. Has noticed an increase in swelling in feet.

## 2023-04-27 ENCOUNTER — Inpatient Hospital Stay (HOSPITAL_COMMUNITY): Payer: Medicaid Other | Admitting: Anesthesiology

## 2023-04-27 ENCOUNTER — Encounter (HOSPITAL_COMMUNITY): Payer: Self-pay | Admitting: Obstetrics & Gynecology

## 2023-04-27 DIAGNOSIS — O368130 Decreased fetal movements, third trimester, not applicable or unspecified: Secondary | ICD-10-CM

## 2023-04-27 DIAGNOSIS — Z3A38 38 weeks gestation of pregnancy: Secondary | ICD-10-CM

## 2023-04-27 DIAGNOSIS — O99344 Other mental disorders complicating childbirth: Secondary | ICD-10-CM

## 2023-04-27 DIAGNOSIS — O99214 Obesity complicating childbirth: Secondary | ICD-10-CM

## 2023-04-27 DIAGNOSIS — O134 Gestational [pregnancy-induced] hypertension without significant proteinuria, complicating childbirth: Secondary | ICD-10-CM

## 2023-04-27 LAB — RPR: RPR Ser Ql: NONREACTIVE

## 2023-04-27 MED ORDER — FUROSEMIDE 20 MG PO TABS
40.0000 mg | ORAL_TABLET | Freq: Every day | ORAL | Status: DC
  Administered 2023-04-27 – 2023-04-28 (×2): 40 mg via ORAL
  Filled 2023-04-27 (×2): qty 2

## 2023-04-27 MED ORDER — FENTANYL-BUPIVACAINE-NACL 0.5-0.125-0.9 MG/250ML-% EP SOLN
12.0000 mL/h | EPIDURAL | Status: DC | PRN
  Filled 2023-04-27: qty 250

## 2023-04-27 MED ORDER — FENTANYL-BUPIVACAINE-NACL 0.5-0.125-0.9 MG/250ML-% EP SOLN
EPIDURAL | Status: DC | PRN
  Administered 2023-04-27: 12 mL/h via EPIDURAL

## 2023-04-27 MED ORDER — LACTATED RINGERS IV SOLN: 500.0000 mL | Freq: Once | INTRAVENOUS | Status: DC

## 2023-04-27 MED ORDER — DIPHENHYDRAMINE HCL 50 MG/ML IJ SOLN: 12.5000 mg | INTRAMUSCULAR | Status: DC | PRN

## 2023-04-27 MED ORDER — DIPHENHYDRAMINE HCL 25 MG PO CAPS: 25.0000 mg | ORAL_CAPSULE | Freq: Four times a day (QID) | ORAL | Status: DC | PRN

## 2023-04-27 MED ORDER — BENZOCAINE-MENTHOL 20-0.5 % EX AERO
1.0000 | INHALATION_SPRAY | CUTANEOUS | Status: DC | PRN
  Administered 2023-04-27: 1 via TOPICAL
  Filled 2023-04-27: qty 56

## 2023-04-27 MED ORDER — DIBUCAINE (PERIANAL) 1 % EX OINT: 1.0000 | TOPICAL_OINTMENT | CUTANEOUS | Status: DC | PRN

## 2023-04-27 MED ORDER — COCONUT OIL OIL: 1.0000 | TOPICAL_OIL | Status: DC | PRN

## 2023-04-27 MED ORDER — PHENYLEPHRINE 80 MCG/ML (10ML) SYRINGE FOR IV PUSH (FOR BLOOD PRESSURE SUPPORT): 80.0000 ug | PREFILLED_SYRINGE | INTRAVENOUS | Status: DC | PRN

## 2023-04-27 MED ORDER — SIMETHICONE 80 MG PO CHEW: 80.0000 mg | CHEWABLE_TABLET | ORAL | Status: DC | PRN

## 2023-04-27 MED ORDER — EPHEDRINE 5 MG/ML INJ: 10.0000 mg | INTRAVENOUS | Status: DC | PRN

## 2023-04-27 MED ORDER — ACETAMINOPHEN 500 MG PO TABS
1000.0000 mg | ORAL_TABLET | Freq: Three times a day (TID) | ORAL | Status: DC
  Administered 2023-04-27 – 2023-04-28 (×5): 1000 mg via ORAL
  Filled 2023-04-27 (×5): qty 2

## 2023-04-27 MED ORDER — LIDOCAINE HCL (PF) 1 % IJ SOLN
INTRAMUSCULAR | Status: DC | PRN
  Administered 2023-04-27: 5 mL via EPIDURAL

## 2023-04-27 MED ORDER — PRENATAL MULTIVITAMIN CH
1.0000 | ORAL_TABLET | Freq: Every day | ORAL | Status: DC
  Administered 2023-04-28: 1 via ORAL
  Filled 2023-04-27: qty 1

## 2023-04-27 MED ORDER — ONDANSETRON HCL 4 MG PO TABS: 4.0000 mg | ORAL_TABLET | ORAL | Status: DC | PRN

## 2023-04-27 MED ORDER — OXYCODONE HCL 5 MG PO TABS
5.0000 mg | ORAL_TABLET | ORAL | Status: DC | PRN
  Administered 2023-04-27 – 2023-04-28 (×3): 5 mg via ORAL
  Filled 2023-04-27 (×3): qty 1

## 2023-04-27 MED ORDER — IBUPROFEN 800 MG PO TABS
800.0000 mg | ORAL_TABLET | Freq: Three times a day (TID) | ORAL | Status: DC
  Administered 2023-04-27 – 2023-04-28 (×5): 800 mg via ORAL
  Filled 2023-04-27 (×5): qty 1

## 2023-04-27 MED ORDER — WITCH HAZEL-GLYCERIN EX PADS: 1.0000 | MEDICATED_PAD | CUTANEOUS | Status: DC | PRN

## 2023-04-27 MED ORDER — SENNOSIDES-DOCUSATE SODIUM 8.6-50 MG PO TABS
2.0000 | ORAL_TABLET | Freq: Every day | ORAL | Status: DC
  Administered 2023-04-27: 2 via ORAL
  Filled 2023-04-27: qty 2

## 2023-04-27 MED ORDER — ZOLPIDEM TARTRATE 5 MG PO TABS: 5.0000 mg | ORAL_TABLET | Freq: Every evening | ORAL | Status: DC | PRN

## 2023-04-27 MED ORDER — ONDANSETRON HCL 4 MG/2ML IJ SOLN: 4.0000 mg | INTRAMUSCULAR | Status: DC | PRN

## 2023-04-27 NOTE — Anesthesia Procedure Notes (Signed)
Epidural Patient location during procedure: OB Start time: 04/27/2023 1:43 AM End time: 04/27/2023 1:50 AM  Staffing Anesthesiologist: Lannie Fields, DO Performed: anesthesiologist   Preanesthetic Checklist Completed: patient identified, IV checked, risks and benefits discussed, monitors and equipment checked, pre-op evaluation and timeout performed  Epidural Patient position: sitting Prep: DuraPrep and site prepped and draped Patient monitoring: continuous pulse ox, blood pressure, heart rate and cardiac monitor Approach: midline Location: L3-L4 Injection technique: LOR air  Needle:  Needle type: Tuohy  Needle gauge: 17 G Needle length: 9 cm Needle insertion depth: 6 cm Catheter type: closed end flexible Catheter size: 19 Gauge Catheter at skin depth: 11 cm Test dose: negative  Assessment Sensory level: T8 Events: blood not aspirated, no cerebrospinal fluid, injection not painful, no injection resistance, no paresthesia and negative IV test  Additional Notes Patient identified. Risks/Benefits/Options discussed with patient including but not limited to bleeding, infection, nerve damage, paralysis, failed block, incomplete pain control, headache, blood pressure changes, nausea, vomiting, reactions to medication both or allergic, itching and postpartum back pain. Confirmed with bedside nurse the patient's most recent platelet count. Confirmed with patient that they are not currently taking any anticoagulation, have any bleeding history or any family history of bleeding disorders. Patient expressed understanding and wished to proceed. All questions were answered. Sterile technique was used throughout the entire procedure. Please see nursing notes for vital signs. Test dose was given through epidural catheter and negative prior to continuing to dose epidural or start infusion. Warning signs of high block given to the patient including shortness of breath, tingling/numbness in  hands, complete motor block, or any concerning symptoms with instructions to call for help. Patient was given instructions on fall risk and not to get out of bed. All questions and concerns addressed with instructions to call with any issues or inadequate analgesia.  Reason for block:procedure for pain

## 2023-04-27 NOTE — Telephone Encounter (Signed)
Called pt to review BP this week any concerns. Left VM stating purpose of call and reminding patient of visit next week with Dan Humphreys, CNM.

## 2023-04-27 NOTE — Lactation Note (Signed)
This note was copied from a baby's chart. Lactation Consultation Note  Patient Name: Brandi [name] Date: 04/27/2023 Age:28 hours  Reason for consult: Initial assessment;Early term 37-38.6wks   Initial LC visit to see P2 mother who has 18 months of breastfeeding ( son now 21 months). Mother states baby latches well and she experiences increased pain with uterine cramping during and after the feeding. RN aware and meds being given.  Mother encouraged to latch baby with feeding cues, place baby skin to skin if not latching, and call for assistance with breastfeeding as needed. Anticipate as baby approaches 24 hours of age, baby will breastfeed more often, 8-12 plus times in 24 hours, and may "cluster feed".   Mom made aware of O/P services, breastfeeding support groups, and our phone # for post-discharge questions.         Maternal Data Has patient been taught Hand Expression?: No Does the patient have breastfeeding experience prior to this delivery?: Yes How long did the patient breastfeed?: 18 months  Feeding Mother's Current Feeding Choice: Breast Milk  Interventions Interventions: Education;LC Services brochure      Consult Status Consult Status: Follow-up Date: 04/28/23 Follow-up type: In-patient    Omar Person, RN, IBCLC 04/27/2023, 6:01 PM

## 2023-04-27 NOTE — Progress Notes (Signed)
LABOR PROGRESS NOTE  Patient Name: Brandi [name], female   DOB: [birth year], 27 y.o.  MRN: [id number]  [id number] at [redacted]w[redacted]d admitted for IOL for DFM and cHTN  S: Comfortable after epidural  O:  BP 111/63   Pulse 79   Temp 97.6 F (36.4 C) (Oral)   Resp 16   Ht 5\' 3"  (1.6 m)   Wt 100.8 kg   LMP 08/06/2022 (Approximate)   SpO2 98%   BMI 39.38 kg/m  EFM:120 bpm/Moderate variability/ 15x15 accels/ None decels CAT: 1 Toco: irregular   CVE: Dilation: 4 Effacement (%): 50 Cervical Position: Posterior Station: -2 Presentation: Vertex Exam by:: Dr. Earlene Plater   A&P:   #Labor: Starting to progress. Risks/benefits of AROM discussed with patient, and verbal consent obtained. Obtained small amount of clear fluid. Mom and babe tolerated well. Start pitocin if ctx continue to space #Pain: Epidural #FWB: CAT 1 #GBS negative #Anticipate vaginal delivery  #cHTN: BP wnl to mild range  [name], MD FMOB Fellow, Faculty practice [facility], Center for [facility] 04/27/23  4:10 AM

## 2023-04-27 NOTE — Discharge Summary (Signed)
Postpartum Discharge Summary  Date of Service updated***     Patient Name: Brandi [name] DOB: 1996-05-28 MRN: [id number]  Date of admission: 04/26/2023 Delivery date:04/27/2023 Delivering provider: [name] Date of discharge: 04/27/2023  Admitting diagnosis: Gestational hypertension, third trimester [O13.3] Intrauterine pregnancy: [redacted]w[redacted]d     Secondary diagnosis:  Principal Problem:   SVD (spontaneous vaginal delivery) Active Problems:   Generalized anxiety disorder   Supervision of high-risk pregnancy   Chronic hypertension affecting pregnancy   Obesity affecting pregnancy  Additional problems: ***    Discharge diagnosis: Term Pregnancy Delivered                                              Post partum procedures:*** Augmentation: AROM and Cytotec Complications: None  Hospital course: Onset of Labor With Vaginal Delivery      27 y.o. yo G3P1011 at [redacted]w[redacted]d was admitted in Latent Labor on 04/26/2023. Presented with DFM and elevated Bps. Labor course was complicated by none.  Membrane Rupture Time/Date: 4:06 AM,04/27/2023  Delivery Method:Vaginal, Spontaneous Operative Delivery:N/A Episiotomy: None Lacerations:  2nd degree;Labial Patient had a postpartum course complicated by ***.  She is ambulating, tolerating a regular diet, passing flatus, and urinating well. Patient is discharged home in stable condition on 04/27/23.  Newborn Data: Birth date:04/27/2023 Birth time:6:56 AM Gender:Female Living status:Living Apgars:8 ,9  Weight:   Magnesium Sulfate received: No BMZ received: No Rhophylac:No MMR:No T-DaP:Given prenatally Flu: {VHQ:46962} RSV Vaccine received: {RSV:31013} Transfusion:{Transfusion received:30440034}  Immunizations received: Immunization History  Administered Date(s) Administered   Tdap 06/22/2021, 04/04/2023    Physical exam  Vitals:   04/27/23 0630 04/27/23 0700 04/27/23 0715 04/27/23 0730  BP: (!) 160/92 (!) 152/78  137/83 (!) 144/88  Pulse: 74 81 76 86  Resp:      Temp:      TempSrc:      SpO2:      Weight:      Height:       General: {Exam; general:21111117} Lochia: {Desc; appropriate/inappropriate:30686::"appropriate"} Uterine Fundus: {Desc; firm/soft:30687} Incision: {Exam; incision:21111123} DVT Evaluation: {Exam; dvt:2111122} Labs: Lab Results  Component Value Date   WBC 8.3 04/26/2023   HGB 12.6 04/26/2023   HCT 36.8 04/26/2023   MCV 85.0 04/26/2023   PLT 207 04/26/2023      Latest Ref Rng & Units 04/26/2023    7:58 PM  CMP  Glucose 70 - 99 mg/dL 952   BUN 6 - 20 mg/dL 7   Creatinine 8.41 - 3.24 mg/dL 4.01   Sodium 027 - 253 mmol/L 137   Potassium 3.5 - 5.1 mmol/L 3.8   Chloride 98 - 111 mmol/L 105   CO2 22 - 32 mmol/L 19   Calcium 8.9 - 10.3 mg/dL 9.3   Total Protein 6.5 - 8.1 g/dL 6.4   Total Bilirubin 0.3 - 1.2 mg/dL 0.8   Alkaline Phos 38 - 126 U/L 62   AST 15 - 41 U/L 22   ALT 0 - 44 U/L 14    Edinburgh Score:    08/17/2021    2:48 PM  Edinburgh Postnatal Depression Scale Screening Tool  I have been able to laugh and see the funny side of things. 0  I have looked forward with enjoyment to things. 0  I have blamed myself unnecessarily when things went wrong. 0  I have been anxious or worried  for no good reason. 0  I have felt scared or panicky for no good reason. 0  Things have been getting on top of me. 1  I have been so unhappy that I have had difficulty sleeping. 0  I have felt sad or miserable. 3  I have been so unhappy that I have been crying. 3  The thought of harming myself has occurred to me. 0  Edinburgh Postnatal Depression Scale Total 7   No data recorded  After visit meds:  Allergies as of 04/27/2023   No Known Allergies   Med Rec must be completed prior to using this [facility]***        Discharge home in stable condition Infant Feeding: {Baby feeding:23562} Infant Disposition:{CHL IP OB HOME WITH ZOXWRU:04540} Discharge  instruction: per After Visit Summary and Postpartum booklet. Activity: Advance as tolerated. Pelvic rest for 6 weeks.  Diet: {OB JWJX:91478295} Future Appointments: Future Appointments  Date Time Provider Department Center  05/02/2023  4:45 PM Osborne Oman [facility]   Follow up Visit:  Message sent to [facility] 10/25  Please schedule this patient for a In person postpartum visit in 6 weeks with the following provider:  Jam . Additional Postpartum F/U:BP check 1 week  High risk pregnancy complicated by:  cHTN Delivery mode:  Vaginal, Spontaneous Anticipated Birth Control:  Unsure   04/27/2023 [name], MD

## 2023-04-27 NOTE — Anesthesia Preprocedure Evaluation (Signed)
Anesthesia Evaluation  Patient identified by MRN, date of birth, ID band Patient awake    Reviewed: Allergy & Precautions, Patient's Chart, lab work & pertinent test results  Airway Mallampati: II  TM Distance: >3 FB Neck ROM: Full    Dental no notable dental hx.    Pulmonary neg pulmonary ROS, former smoker   Pulmonary exam normal breath sounds clear to auscultation       Cardiovascular hypertension (cHTN, no home meds), Normal cardiovascular exam Rhythm:Regular Rate:Normal     Neuro/Psych  Headaches PSYCHIATRIC DISORDERS Anxiety Depression       GI/Hepatic negative GI ROS, Neg liver ROS,,,  Endo/Other  Obesity BMI 39  Renal/GU negative Renal ROS  negative genitourinary   Musculoskeletal negative musculoskeletal ROS (+)    Abdominal  (+) + obese  Peds negative pediatric ROS (+)  Hematology negative hematology ROS (+) Hb 12.6, plt 207   Anesthesia Other Findings   Reproductive/Obstetrics (+) Pregnancy                             Anesthesia Physical Anesthesia Plan  ASA: 2  Anesthesia Plan: Epidural   Post-op Pain Management:    Induction:   PONV Risk Score and Plan: 2  Airway Management Planned: Natural Airway  Additional Equipment: None  Intra-op Plan:   Post-operative Plan:   Informed Consent: I have reviewed the patients History and Physical, chart, labs and discussed the procedure including the risks, benefits and alternatives for the proposed anesthesia with the patient or authorized representative who has indicated his/her understanding and acceptance.       Plan Discussed with:   Anesthesia Plan Comments:        Anesthesia Quick Evaluation

## 2023-04-28 ENCOUNTER — Other Ambulatory Visit (HOSPITAL_COMMUNITY): Payer: Self-pay

## 2023-04-28 MED ORDER — COCONUT OIL OIL: 1.0000 | TOPICAL_OIL | Status: DC | PRN

## 2023-04-28 MED ORDER — FUROSEMIDE 40 MG PO TABS
40.0000 mg | ORAL_TABLET | Freq: Every day | ORAL | 0 refills | Status: DC
  Filled 2023-04-28: qty 7, 7d supply, fill #0

## 2023-04-28 MED ORDER — SENNOSIDES-DOCUSATE SODIUM 8.6-50 MG PO TABS: 2.0000 | ORAL_TABLET | Freq: Every day | ORAL | Status: DC

## 2023-04-28 MED ORDER — WITCH HAZEL-GLYCERIN EX PADS: 1.0000 | MEDICATED_PAD | CUTANEOUS | Status: DC | PRN

## 2023-04-28 MED ORDER — ACETAMINOPHEN 500 MG PO TABS
1000.0000 mg | ORAL_TABLET | Freq: Three times a day (TID) | ORAL | 0 refills | Status: DC
  Filled 2023-04-28: qty 30, 5d supply, fill #0

## 2023-04-28 MED ORDER — NIFEDIPINE ER OSMOTIC RELEASE 30 MG PO TB24
30.0000 mg | ORAL_TABLET | Freq: Every day | ORAL | Status: DC
  Administered 2023-04-28: 30 mg via ORAL
  Filled 2023-04-28: qty 1

## 2023-04-28 MED ORDER — NIFEDIPINE ER 30 MG PO TB24
30.0000 mg | ORAL_TABLET | Freq: Every day | ORAL | 0 refills | Status: DC
  Filled 2023-04-28: qty 30, 30d supply, fill #0

## 2023-04-28 MED ORDER — IBUPROFEN 800 MG PO TABS
800.0000 mg | ORAL_TABLET | Freq: Three times a day (TID) | ORAL | 0 refills | Status: DC
  Filled 2023-04-28: qty 30, 10d supply, fill #0

## 2023-04-28 MED ORDER — SIMETHICONE 80 MG PO CHEW: 80.0000 mg | CHEWABLE_TABLET | ORAL | Status: DC | PRN

## 2023-04-28 NOTE — Lactation Note (Signed)
This note was copied from a baby's chart. Lactation Consultation Note  Patient Name: Brandi [name] [id number] Date: 04/28/2023 Age:28 hours Reason for consult: Follow-up assessment  P2, Reviewed hand expression with drops expressed, good flow.  Unwrapped baby for feeding.  Assisted with latching with chin tug to wide latch.  Feed on demand with cues.  Goal 8-12+ times per day after first 24 hrs.  Place baby STS if not cueing.  Reviewed cluster feeding and suggest calling for help as needed. Provided pillows for support in addition to mother's boppy pillow.    Maternal Data Has patient been taught Hand Expression?: Yes  Feeding Mother's Current Feeding Choice: Breast Milk  LATCH Score Latch: Grasps breast easily, tongue down, lips flanged, rhythmical sucking.  Audible Swallowing: Spontaneous and intermittent  Type of Nipple: Everted at rest and after stimulation  Comfort (Breast/Nipple): Filling, red/small blisters or bruises, mild/mod discomfort  Hold (Positioning): Assistance needed to correctly position infant at breast and maintain latch.  LATCH Score: 8  Interventions Interventions: Breast feeding basics reviewed;Assisted with latch;Skin to skin;Breast massage;Hand express;Support pillows;Expressed milk;Education  Consult Status Consult Status: Follow-up from L&D Date: 04/29/23 Follow-up type: In-patient    Dahlia Byes [facility] 04/28/2023, 11:11 AM

## 2023-04-28 NOTE — Progress Notes (Signed)
MOB was referred for history of depression/anxiety.  * Referral screened out by Clinical Social Worker because none of the following criteria appear to apply:  ~ History of anxiety/depression during this pregnancy, or of post-partum depression following prior delivery.  ~ Diagnosis of anxiety and/or depression within last 3 years  OR  * MOB's symptoms currently being treated with medication and/or therapy.  Per OB notes, MOB is prescribed Hydroxyzine 50mg  PRN for support.  Please contact the Clinical Social Worker if needs arise, by [facility] request, or if MOB scores greater than 9/yes to question 10 on Edinburgh Postpartum Depression Screen.  Enos Fling, Theresia Majors Clinical Social Worker [phone]

## 2023-04-28 NOTE — Progress Notes (Addendum)
POSTPARTUM PROGRESS NOTE  Subjective: Brandi [name] is a 27 y.o. [id number] s/p SVD at [redacted]w[redacted]d.  She reports she is doing well. No acute events overnight. She denies any problems with ambulating, voiding or po intake. Denies nausea or vomiting. She has passed flatus. She has not had bowel movement. Pain is well controlled.  Lochia is Small.  No headache, vision changes, and RUQ pain.  Objective: BP 125/82 (BP Location: Left Arm)   Pulse 83   Temp 98.5 F (36.9 C) (Oral)   Resp 20   Ht 5\' 3"  (1.6 m)   Wt 100.8 kg   LMP 08/06/2022 (Approximate)   SpO2 100%   Breastfeeding Unknown   BMI 39.38 kg/m   Physical Exam:  General: alert, cooperative and no distress Chest: CTAB, no respiratory distress Abdomen: soft, non-tender  Uterine Fundus: firm, appropriately tender, above umbilicus Extremities: No calf swelling or tenderness  1+ bilateral pitting edema Recent Labs    04/26/23 1958  HGB 12.6  HCT 36.8    Assessment/Plan: Lakiera Thornbrugh is a 27 y.o. [id number] s/p IOL at [redacted]w[redacted]d for DFM, cHTN.   LOS: 2 days   PPD#1: Doing well, pain well-controlled.  -- Routine postpartum care, lactation support -- Encouraged up OOB -- Contraception:  Unsure, considering nexplanon or IUD outpatient,  counseling provided -- Feeding: breast feeding -- Circumcision: yes  #cHTN: Normal range BPs since delivery.  Continue Lasix 40 mg.  Start 30mg  Procardia XL. CTM.  Dispo: Plan for discharge tomorrow or later tonight after baby's circumcision.  Dimitry Randolm [facility] PGY-1 04/28/23 6:11 AM  GME ATTESTATION:  Evaluation and management procedures were performed by the [facility] Medicine Resident under my supervision. I was immediately available for direct supervision, assistance and direction throughout this encounter.  I also confirm that I have verified the information documented in the resident's note, and that I have also personally reperformed the pertinent components of the physical  exam and all of the medical decision making activities.  I have also made any necessary editorial changes.  Wyn Forster, MD OB Fellow, Faculty Practice [facility], Center for [facility] 04/28/2023 3:11 PM

## 2023-04-28 NOTE — Anesthesia Postprocedure Evaluation (Signed)
Anesthesia Post Note  Patient: Brandi [name], Brandi [name]  Procedure(s) Performed: AN AD HOC LABOR EPIDURAL     Anesthesia Type: Epidural Anesthetic complications: no   No notable events documented.  Last Vitals:  Vitals:   04/27/23 2125 04/28/23 0520  BP: 124/79 125/82  Pulse: 94 83  Resp:    Temp: 37 C 36.9 C  SpO2: 100% 100%    Last Pain:  Vitals:   04/28/23 0520  TempSrc: Oral  PainSc:    Pain Goal: Patients Stated Pain Goal: 8 (04/26/23 2217)                 Sonda Primes

## 2023-05-02 ENCOUNTER — Encounter: Payer: Medicaid Other | Admitting: Certified Nurse Midwife

## 2023-05-04 ENCOUNTER — Inpatient Hospital Stay (HOSPITAL_COMMUNITY)
Admission: RE | Admit: 2023-05-04 | Payer: Medicaid Other | Source: Home / Self Care | Admitting: Obstetrics & Gynecology

## 2023-05-04 ENCOUNTER — Inpatient Hospital Stay (HOSPITAL_COMMUNITY): Payer: Medicaid Other

## 2023-05-16 ENCOUNTER — Telehealth (HOSPITAL_COMMUNITY): Payer: Self-pay

## 2023-05-16 NOTE — Telephone Encounter (Signed)
05/16/2023 1159  Name: Cara Ossa MRN: [id number] DOB: 26-Sep-1995  Reason for Call:  Transition of Care Hospital Discharge Call  Contact Status: Patient Contact Status: Message  Language assistant needed: Interpreter Mode: Interpreter Not Needed        Follow-Up Questions:    Inocente Salles Postnatal Depression Scale:  In the Past 7 Days:    PHQ2-9 Depression Scale:     Discharge Follow-up:    Post-discharge interventions: NA  Signature  Signe Colt

## 2023-06-05 ENCOUNTER — Encounter: Payer: Self-pay | Admitting: Certified Nurse Midwife

## 2023-06-06 ENCOUNTER — Ambulatory Visit: Payer: Medicaid Other | Admitting: Certified Nurse Midwife

## 2023-06-06 DIAGNOSIS — F411 Generalized anxiety disorder: Secondary | ICD-10-CM

## 2023-06-06 DIAGNOSIS — I10 Essential (primary) hypertension: Secondary | ICD-10-CM

## 2023-06-06 DIAGNOSIS — F4321 Adjustment disorder with depressed mood: Secondary | ICD-10-CM

## 2023-06-08 NOTE — Progress Notes (Signed)
Appt rescheduled

## 2023-06-13 ENCOUNTER — Other Ambulatory Visit: Payer: Self-pay

## 2023-06-13 ENCOUNTER — Ambulatory Visit: Payer: Medicaid Other | Admitting: Certified Nurse Midwife

## 2023-06-13 ENCOUNTER — Encounter: Payer: Self-pay | Admitting: Certified Nurse Midwife

## 2023-06-13 DIAGNOSIS — F5105 Insomnia due to other mental disorder: Secondary | ICD-10-CM

## 2023-06-13 DIAGNOSIS — F411 Generalized anxiety disorder: Secondary | ICD-10-CM

## 2023-06-13 DIAGNOSIS — I10 Essential (primary) hypertension: Secondary | ICD-10-CM

## 2023-06-13 DIAGNOSIS — F4321 Adjustment disorder with depressed mood: Secondary | ICD-10-CM

## 2023-06-13 MED ORDER — HYDROXYZINE HCL 50 MG PO TABS: 50.0000 mg | ORAL_TABLET | Freq: Three times a day (TID) | ORAL | 0 refills | Status: DC | PRN

## 2023-06-13 MED ORDER — AMLODIPINE BESYLATE 5 MG PO TABS
5.0000 mg | ORAL_TABLET | Freq: Every day | ORAL | 2 refills | Status: DC
Start: 2023-06-13 — End: 2023-06-20

## 2023-06-13 MED ORDER — SERTRALINE HCL 25 MG PO TABS
25.0000 mg | ORAL_TABLET | Freq: Every day | ORAL | 6 refills | Status: DC
Start: 2023-06-13 — End: 2023-06-20

## 2023-06-13 NOTE — Progress Notes (Signed)
Post Partum Visit Note  Brandi [name] is a 27 y.o. [id number] female who presents for a postpartum visit. She is 6 weeks 5 days postpartum following a normal spontaneous vaginal delivery.  I have fully reviewed the prenatal and intrapartum course. The delivery was at 38 gestational weeks.  Anesthesia: epidural. Postpartum course has been uncomplicated physically. Baby is doing well. Baby is feeding by bottle. Bleeding no bleeding. Bowel function is normal. Bladder function is normal. Patient is not sexually active. Contraception method is condoms and NFP . Postpartum depression screening: negative.   Edinburgh Postnatal Depression Scale - 06/13/23 1801       Edinburgh Postnatal Depression Scale:  In the Past 7 Days   I have been able to laugh and see the funny side of things. 0    I have looked forward with enjoyment to things. 0    I have blamed myself unnecessarily when things went wrong. 0    I have been anxious or worried for no good reason. 0    I have felt scared or panicky for no good reason. 0    Things have been getting on top of me. 2    I have been so unhappy that I have had difficulty sleeping. 2    I have felt sad or miserable. 2    I have been so unhappy that I have been crying. 2    The thought of harming myself has occurred to me. 0    Edinburgh Postnatal Depression Scale Total 8             Health Maintenance Due  Topic Date Due   COVID-19 Vaccine (1 - 2024-25 season) Never done   The following portions of the patient's history were reviewed and updated as appropriate: allergies, current medications, past family history, past medical history, past social history, past surgical history, and problem list.  Review of Systems Pertinent items noted in HPI and remainder of comprehensive ROS otherwise negative.  Objective:  BP (!) 145/93   Pulse 62   Wt 200 lb 4.8 oz (90.9 kg)   Breastfeeding No   BMI 35.48 kg/m   Constitutional: Alert, oriented female in  no physical distress.  HEENT: PERRLA Skin: normal color and turgor, no rash Cardiovascular: normal rate & rhythm Respiratory: normal effort, no problems with respiration noted GI: Abd soft, non-tender MS: Extremities nontender, no edema, normal ROM Neurologic: Alert and oriented x 4.  GU: no CVA tenderness Pelvic: exam deferred      Assessment:  1. Postpartum care and examination (Primary) - Doing well physically, still battling grief and anxiety  2. Complicated grief - sertraline (ZOLOFT) 25 MG tablet; Take 1 tablet (25 mg total) by mouth daily after supper. (Patient not taking: Reported on 06/14/2023)  Dispense: 30 tablet; Refill: 6  3. Generalized anxiety disorder - sertraline (ZOLOFT) 25 MG tablet; Take 1 tablet (25 mg total) by mouth daily after supper. (Patient not taking: Reported on 06/14/2023)  Dispense: 30 tablet; Refill: 6  4. Chronic hypertension - Still elevated today, will prescribe norvasc (procardia causes headaches) - amLODipine (NORVASC) 5 MG tablet; Take 1 tablet (5 mg total) by mouth daily.  Dispense: 30 tablet; Refill: 2  5. SVD (spontaneous vaginal delivery) - Recovering well  6. Insomnia due to anxiety and fear - hydrOXYzine (ATARAX) 50 MG tablet; Take 1 tablet (50 mg total) by mouth 3 (three) times daily as needed.  Dispense: 30 tablet; Refill: 0    Plan:  Essential components of care per ACOG recommendations:  1.  Mood and well being: Patient with positive depression screening today. Reviewed local resources for support.  - Patient tobacco use? No.   - hx of drug use? No.    2. Infant care and feeding:  -Patient currently breastmilk feeding? No.  -Social determinants of health (SDOH) reviewed in EPIC.   3. Sexuality, contraception and birth spacing - Patient does not want a pregnancy in the next year.  Desired family size is 2 children.  - Reviewed reproductive life planning. Reviewed contraceptive methods based on pt preferences and  effectiveness.  Patient desired FAM or LAM and Female Condom today.   - Discussed birth spacing of 18 months  4. Sleep and fatigue -Encouraged family/partner/community support of 4 hrs of uninterrupted sleep to help with mood and fatigue  5. Physical Recovery  - Discussed patients delivery and complications. She describes her labor as mixed. - Patient had a Vaginal, no problems at delivery. Patient had a 2nd degree laceration. Perineal healing reviewed. Patient expressed understanding - Patient has urinary incontinence? No. - Patient is safe to resume physical and sexual activity  6.  Health Maintenance - HM due items addressed Yes - Last pap smear 12/22/20: NILM. Pap smear not done at today's visit.  -Breast Cancer screening indicated? No.   7. Chronic Disease/Pregnancy Condition follow up: Hypertension - PCP follow up as needed  Bernerd Limbo, CNM Center for [employer], [facility]

## 2023-06-17 ENCOUNTER — Encounter (HOSPITAL_COMMUNITY): Payer: Self-pay | Admitting: Behavioral Health

## 2023-06-17 ENCOUNTER — Inpatient Hospital Stay (HOSPITAL_COMMUNITY)
Admission: AD | Admit: 2023-06-17 | Discharge: 2023-06-20 | DRG: 776 | Disposition: A | Discharge status: Home / Self Care | Payer: Medicaid Other | Source: Intra-hospital | Attending: Psychiatry | Admitting: Psychiatry

## 2023-06-17 ENCOUNTER — Ambulatory Visit (HOSPITAL_COMMUNITY)
Admission: EM | Admit: 2023-06-17 | Discharge: 2023-06-17 | Disposition: A | Discharge status: Other Acute Inpatient Hospital | Payer: Medicaid Other | Attending: Behavioral Health | Admitting: Behavioral Health

## 2023-06-17 DIAGNOSIS — F1290 Cannabis use, unspecified, uncomplicated: Secondary | ICD-10-CM | POA: Insufficient documentation

## 2023-06-17 DIAGNOSIS — T1491XA Suicide attempt, initial encounter: Secondary | ICD-10-CM | POA: Insufficient documentation

## 2023-06-17 DIAGNOSIS — Z79899 Other long term (current) drug therapy: Secondary | ICD-10-CM | POA: Diagnosis not present

## 2023-06-17 DIAGNOSIS — Z8249 Family history of ischemic heart disease and other diseases of the circulatory system: Secondary | ICD-10-CM | POA: Diagnosis not present

## 2023-06-17 DIAGNOSIS — Z8616 Personal history of COVID-19: Secondary | ICD-10-CM | POA: Diagnosis not present

## 2023-06-17 DIAGNOSIS — O99335 Smoking (tobacco) complicating the puerperium: Secondary | ICD-10-CM | POA: Diagnosis present

## 2023-06-17 DIAGNOSIS — F419 Anxiety disorder, unspecified: Secondary | ICD-10-CM | POA: Insufficient documentation

## 2023-06-17 DIAGNOSIS — F332 Major depressive disorder, recurrent severe without psychotic features: Secondary | ICD-10-CM | POA: Diagnosis present

## 2023-06-17 DIAGNOSIS — F17210 Nicotine dependence, cigarettes, uncomplicated: Secondary | ICD-10-CM | POA: Diagnosis present

## 2023-06-17 DIAGNOSIS — Z800 Family history of malignant neoplasm of digestive organs: Secondary | ICD-10-CM

## 2023-06-17 DIAGNOSIS — O99345 Other mental disorders complicating the puerperium: Secondary | ICD-10-CM | POA: Diagnosis present

## 2023-06-17 DIAGNOSIS — F329 Major depressive disorder, single episode, unspecified: Principal | ICD-10-CM | POA: Diagnosis present

## 2023-06-17 DIAGNOSIS — F4310 Post-traumatic stress disorder, unspecified: Secondary | ICD-10-CM | POA: Insufficient documentation

## 2023-06-17 DIAGNOSIS — Z818 Family history of other mental and behavioral disorders: Secondary | ICD-10-CM

## 2023-06-17 DIAGNOSIS — Z823 Family history of stroke: Secondary | ICD-10-CM

## 2023-06-17 DIAGNOSIS — Z560 Unemployment, unspecified: Secondary | ICD-10-CM | POA: Insufficient documentation

## 2023-06-17 DIAGNOSIS — Z833 Family history of diabetes mellitus: Secondary | ICD-10-CM | POA: Diagnosis not present

## 2023-06-17 DIAGNOSIS — E282 Polycystic ovarian syndrome: Secondary | ICD-10-CM | POA: Diagnosis present

## 2023-06-17 DIAGNOSIS — F32A Depression, unspecified: Secondary | ICD-10-CM

## 2023-06-17 DIAGNOSIS — F1090 Alcohol use, unspecified, uncomplicated: Secondary | ICD-10-CM | POA: Insufficient documentation

## 2023-06-17 DIAGNOSIS — I10 Essential (primary) hypertension: Secondary | ICD-10-CM

## 2023-06-17 LAB — HEMOGLOBIN A1C
Hgb A1c MFr Bld: 5 % (ref 4.8–5.6)
Mean Plasma Glucose: 96.8 mg/dL

## 2023-06-17 LAB — LIPID PANEL
Cholesterol: 298 mg/dL — ABNORMAL HIGH (ref 0–200)
HDL: 86 mg/dL (ref >40)
LDL Cholesterol: 197 mg/dL — ABNORMAL HIGH (ref 0–99)
Total CHOL/HDL Ratio: 3.5 {ratio}
Triglycerides: 76 mg/dL (ref <150)
VLDL: 15 mg/dL (ref 0–40)

## 2023-06-17 LAB — COMPREHENSIVE METABOLIC PANEL
ALT: 31 U/L (ref 0–44)
AST: 23 U/L (ref 15–41)
Albumin: 4.3 g/dL (ref 3.5–5.0)
Alkaline Phosphatase: 43 U/L (ref 38–126)
Anion gap: 8 (ref 5–15)
BUN: 11 mg/dL (ref 6–20)
CO2: 24 mmol/L (ref 22–32)
Calcium: 9.7 mg/dL (ref 8.9–10.3)
Chloride: 105 mmol/L (ref 98–111)
Creatinine, Ser: 1.17 mg/dL — ABNORMAL HIGH (ref 0.44–1.00)
GFR, Estimated: >60 mL/min (ref >60)
Glucose, Bld: 77 mg/dL (ref 70–99)
Potassium: 4.3 mmol/L (ref 3.5–5.1)
Sodium: 137 mmol/L (ref 135–145)
Total Bilirubin: 1.3 mg/dL — ABNORMAL HIGH (ref <1.2)
Total Protein: 7.2 g/dL (ref 6.5–8.1)

## 2023-06-17 LAB — POCT URINE DRUG SCREEN - MANUAL ENTRY (I-SCREEN)
POC Amphetamine UR: NOT DETECTED
POC Buprenorphine (BUP): NOT DETECTED
POC Cocaine UR: NOT DETECTED
POC Marijuana UR: POSITIVE — AB
POC Methadone UR: NOT DETECTED
POC Methamphetamine UR: NOT DETECTED
POC Morphine: NOT DETECTED
POC Oxazepam (BZO): NOT DETECTED
POC Oxycodone UR: NOT DETECTED
POC Secobarbital (BAR): NOT DETECTED

## 2023-06-17 LAB — CBC WITH DIFFERENTIAL/PLATELET
Abs Immature Granulocytes: 0.01 10*3/uL (ref 0.00–0.07)
Basophils Absolute: 0 10*3/uL (ref 0.0–0.1)
Basophils Relative: 0 %
Eosinophils Absolute: 0.1 10*3/uL (ref 0.0–0.5)
Eosinophils Relative: 1 %
HCT: 40.3 % (ref 36.0–46.0)
Hemoglobin: 13 g/dL (ref 12.0–15.0)
Immature Granulocytes: 0 %
Lymphocytes Relative: 31 %
Lymphs Abs: 2.3 10*3/uL (ref 0.7–4.0)
MCH: 27.1 pg (ref 26.0–34.0)
MCHC: 32.3 g/dL (ref 30.0–36.0)
MCV: 84.1 fL (ref 80.0–100.0)
Monocytes Absolute: 0.4 10*3/uL (ref 0.1–1.0)
Monocytes Relative: 5 %
Neutro Abs: 4.6 10*3/uL (ref 1.7–7.7)
Neutrophils Relative %: 63 %
Platelets: 317 10*3/uL (ref 150–400)
RBC: 4.79 MIL/uL (ref 3.87–5.11)
RDW: 13.1 % (ref 11.5–15.5)
WBC: 7.3 10*3/uL (ref 4.0–10.5)
nRBC: 0 % (ref 0.0–0.2)

## 2023-06-17 LAB — SALICYLATE LEVEL: Salicylate Lvl: <7 mg/dL — ABNORMAL LOW (ref 7.0–30.0)

## 2023-06-17 LAB — POC URINE PREG, ED: Preg Test, Ur: NEGATIVE

## 2023-06-17 LAB — ETHANOL: Alcohol, Ethyl (B): <10 mg/dL (ref <10)

## 2023-06-17 LAB — ACETAMINOPHEN LEVEL: Acetaminophen (Tylenol), Serum: <10 ug/mL — ABNORMAL LOW (ref 10–30)

## 2023-06-17 LAB — TSH: TSH: 0.783 u[IU]/mL (ref 0.350–4.500)

## 2023-06-17 MED ORDER — ALUM & MAG HYDROXIDE-SIMETH 200-200-20 MG/5ML PO SUSP: 30.0000 mL | ORAL | Status: DC | PRN

## 2023-06-17 MED ORDER — HYDROXYZINE HCL 25 MG PO TABS: 25.0000 mg | ORAL_TABLET | Freq: Three times a day (TID) | ORAL | Status: DC | PRN

## 2023-06-17 MED ORDER — DIPHENHYDRAMINE HCL 50 MG/ML IJ SOLN: 50.0000 mg | Freq: Three times a day (TID) | INTRAMUSCULAR | Status: DC | PRN

## 2023-06-17 MED ORDER — HALOPERIDOL LACTATE 5 MG/ML IJ SOLN: 5.0000 mg | Freq: Three times a day (TID) | INTRAMUSCULAR | Status: DC | PRN

## 2023-06-17 MED ORDER — AMLODIPINE BESYLATE 5 MG PO TABS
5.0000 mg | ORAL_TABLET | Freq: Every day | ORAL | Status: DC
  Administered 2023-06-18 – 2023-06-20 (×3): 5 mg via ORAL
  Filled 2023-06-17 (×4): qty 1

## 2023-06-17 MED ORDER — MAGNESIUM HYDROXIDE 400 MG/5ML PO SUSP: 30.0000 mL | Freq: Every day | ORAL | Status: DC | PRN

## 2023-06-17 MED ORDER — DIPHENHYDRAMINE HCL 50 MG/ML IJ SOLN
50.0000 mg | Freq: Three times a day (TID) | INTRAMUSCULAR | Status: DC | PRN
Start: 2023-06-17 — End: 2023-06-20

## 2023-06-17 MED ORDER — ACETAMINOPHEN 325 MG PO TABS
650.0000 mg | ORAL_TABLET | Freq: Four times a day (QID) | ORAL | Status: DC | PRN
  Administered 2023-06-17: 650 mg via ORAL
  Filled 2023-06-17: qty 2

## 2023-06-17 MED ORDER — HALOPERIDOL 5 MG PO TABS: 5.0000 mg | ORAL_TABLET | Freq: Three times a day (TID) | ORAL | Status: DC | PRN

## 2023-06-17 MED ORDER — HALOPERIDOL LACTATE 5 MG/ML IJ SOLN: 10.0000 mg | Freq: Three times a day (TID) | INTRAMUSCULAR | Status: DC | PRN

## 2023-06-17 MED ORDER — DIPHENHYDRAMINE HCL 25 MG PO CAPS: 50.0000 mg | ORAL_CAPSULE | Freq: Three times a day (TID) | ORAL | Status: DC | PRN

## 2023-06-17 MED ORDER — LORAZEPAM 2 MG/ML IJ SOLN: 2.0000 mg | Freq: Three times a day (TID) | INTRAMUSCULAR | Status: DC | PRN

## 2023-06-17 MED ORDER — TRAZODONE HCL 50 MG PO TABS: 50.0000 mg | ORAL_TABLET | Freq: Every evening | ORAL | Status: DC | PRN

## 2023-06-17 MED ORDER — SERTRALINE HCL 25 MG PO TABS
25.0000 mg | ORAL_TABLET | Freq: Every day | ORAL | Status: DC
Start: 2023-06-18 — End: 2023-06-17

## 2023-06-17 MED ORDER — TRAZODONE HCL 50 MG PO TABS
50.0000 mg | ORAL_TABLET | Freq: Every evening | ORAL | Status: DC | PRN
  Administered 2023-06-17 – 2023-06-18 (×2): 50 mg via ORAL
  Filled 2023-06-17 (×2): qty 1

## 2023-06-17 MED ORDER — SERTRALINE HCL 25 MG PO TABS
25.0000 mg | ORAL_TABLET | Freq: Every day | ORAL | Status: DC
Start: 2023-06-18 — End: 2023-06-19
  Administered 2023-06-18: 25 mg via ORAL
  Filled 2023-06-17 (×3): qty 1

## 2023-06-17 MED ORDER — HYDROXYZINE HCL 25 MG PO TABS
25.0000 mg | ORAL_TABLET | Freq: Three times a day (TID) | ORAL | Status: DC | PRN
Start: 2023-06-17 — End: 2023-06-20
  Administered 2023-06-17 – 2023-06-19 (×3): 25 mg via ORAL
  Filled 2023-06-17 (×3): qty 1

## 2023-06-17 MED ORDER — AMLODIPINE BESYLATE 5 MG PO TABS: 5.0000 mg | ORAL_TABLET | Freq: Every day | ORAL | Status: DC

## 2023-06-17 MED ORDER — ACETAMINOPHEN 325 MG PO TABS: 650.0000 mg | ORAL_TABLET | Freq: Four times a day (QID) | ORAL | Status: DC | PRN
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
## 2023-06-17 NOTE — Progress Notes (Signed)
[facility] LCSW Progress Note   06/17/2023    3:57 PM  Brandi [name]   [id number]   Type of Contact and Topic:  Psychiatric Bed Placement   Pt accepted to [facility] 307-1    Patient meets inpatient criteria per Liborio Nixon, NP  The attending provider will be Dr. Abbott Pao   Call report to [phone]    Bedelia Person, RN @ [facility] notified.     Pt scheduled  to arrive at [facility] TODAY.    Damita Dunnings, MSW, LCSW-A  3:58 PM 06/17/2023
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
## 2023-06-17 NOTE — ED Notes (Addendum)
Pt is a 41 y female flat affect and depressed mood. Reports relationship issues as well as recent family losses.  Pt states that she had SI this morning of overdosing on Ibuprofen.   Labs were performed and pt was searched without contraband being found. Pt was escorted to unit and oriented to milieu.  Pt was offered food and water.   Pt's belongings locked in locker 25.

## 2023-06-17 NOTE — Progress Notes (Signed)
Patient is a 27 year old female who presented from the [facility] under voluntary admission for suicidal ideation in the context of "feeling overwhelmed". Pt reported that she stays at home with her 68 week old and 28 year old- and receives very little support from her husband. Pt reported that she's at home all day by herself with her kids, with no transportation, and no social interactions. Pt also reported losing her mother and uncle recently, but has been so overwhelmed with caring for her kids that she hasn't had time to process her mother's death. Pt denied alcohol use, and denied illicit drug use. Pt presented with a sad affect, was calm and cooperative, answered questions logically and coherently during admission interview and assessment. Admission paperwork completed and signed. Verbal understanding expressed. VS monitored and recorded. Skin check performed with RN Belongings searched and secured in locker. Patient was oriented to unit and schedule. Pt currently denies SI/HI and A/VH. Pt provided with meal and po fluids. Q 15 min checks initiated for safety.

## 2023-06-17 NOTE — Tx Team (Signed)
Initial Treatment Plan 06/17/2023 8:06 PM [name] QIO:[id number]    PATIENT STRESSORS: Financial difficulties   Marital or family conflict     PATIENT STRENGTHS: Average or above average intelligence  Communication skills  Motivation for treatment/growth  Physical Health    PATIENT IDENTIFIED PROBLEMS: Suicidal ideation    "Feeling overwhelmed"    No sleep             DISCHARGE CRITERIA:  Improved stabilization in mood, thinking, and/or behavior Need for constant or close observation no longer present Reduction of life-threatening or endangering symptoms to within safe limits Verbal commitment to aftercare and medication compliance  PRELIMINARY DISCHARGE PLAN: Outpatient therapy Return to previous living arrangement  PATIENT/FAMILY INVOLVEMENT: This treatment plan has been presented to and reviewed with the patient, Brandi, [name] patient has been given the opportunity to ask questions and make suggestions.  Shela Nevin, RN 06/17/2023, 8:06 PM

## 2023-06-17 NOTE — Discharge Instructions (Addendum)
Patient accepted to [facility] today.
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
## 2023-06-17 NOTE — Group Note (Unsigned)
Group Topic: Relapse and Recovery  Group Date: 06/17/2023 Start Time: 1440 End Time: 1500 Facilitators: Jenean Lindau, RN  Department: [facility]  Number of Participants: 2  Group Focus: chemical dependency education Treatment Modality:  Behavior Modification Therapy Interventions utilized were exploration and patient education Purpose: express feelings, regain self-worth, and reinforce self-care   Name: Brandi [name] Date of Birth: 12/17/1995  MR: [id number]    Level of Participation: {THERAPIES; PSYCH GROUP PARTICIPATION MWUXL:24401} Quality of Participation: {THERAPIES; PSYCH QUALITY OF PARTICIPATION:23992} Interactions with others: {THERAPIES; PSYCH INTERACTIONS:23993} Mood/Affect: {THERAPIES; PSYCH MOOD/AFFECT:23994} Triggers (if applicable): *** Cognition: {THERAPIES; PSYCH COGNITION:23995} Progress: {THERAPIES; PSYCH PROGRESS:23997} Response: *** Plan: {THERAPIES; PSYCH UUVO:53664}  Patients Problems:  Patient Active Problem List   Diagnosis Date Noted   SVD (spontaneous vaginal delivery) 04/27/2023   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

## 2023-06-17 NOTE — [facility] Group Notes (Signed)
[facility] Group Notes:  (Nursing/MHT/Case Management/Adjunct)  Date:  06/17/2023  Time:  2000  Type of Therapy:   Wrap up group  Participation Level:  Active  Participation Quality:  Appropriate, Attentive, and Sharing  Affect:  Depressed  Cognitive:  Alert  Insight:  Improving  Engagement in Group:  Engaged  Modes of Intervention:  Clarification, Education, and Socialization  Summary of Progress/Problems: Positive thinking and self-care were discussed.   [name] S 06/17/2023, 9:15 PM

## 2023-06-18 ENCOUNTER — Encounter (HOSPITAL_COMMUNITY): Payer: Self-pay

## 2023-06-18 DIAGNOSIS — F332 Major depressive disorder, recurrent severe without psychotic features: Secondary | ICD-10-CM | POA: Diagnosis not present

## 2023-06-18 NOTE — [facility] Group Notes (Signed)
Spiritual care group on grief and loss facilitated by Chaplain Dyanne Carrel, [facility]  Group Goal: Support / Education around grief and loss  Members engage in facilitated group support and psycho-social education.  Group Description:  Following introductions and group rules, group members engaged in facilitated group dialogue and support around topic of loss, with particular support around experiences of loss in their lives. Group Identified types of loss (relationships / self / things) and identified patterns, circumstances, and changes that precipitate losses. Reflected on thoughts / feelings around loss, normalized grief responses, and recognized variety in grief experience. Group encouraged individual reflection on safe space and on the coping skills that they are already utilizing.  Group drew on Adlerian / Rogerian and narrative framework  Patient Progress: Brandi [name] attended group.  Though verbal participation was minimal, she demonstrated engagement in the group conversation.

## 2023-06-18 NOTE — Progress Notes (Signed)
   06/18/23 2000  Psych Admission Type (Psych Patients Only)  Admission Status Voluntary  Psychosocial Assessment  Patient Complaints Anxiety;Depression  Eye Contact Brief  Facial Expression Anxious;Flat  Affect Anxious  Speech Soft  Interaction Assertive  Motor Activity Other (Comment) (WNL)  Appearance/Hygiene Unremarkable  Behavior Characteristics Cooperative  Mood Depressed;Anxious  Thought Process  Coherency WDL  Content WDL  Delusions None reported or observed  Perception WDL  Hallucination None reported or observed  Judgment Limited  Confusion None  Danger to Self  Current suicidal ideation? Denies  Self-Injurious Behavior No self-injurious ideation or behavior indicators observed or expressed   Agreement Not to Harm Self Yes  Description of Agreement Verbal Agreement  Danger to Others  Danger to Others None reported or observed

## 2023-06-18 NOTE — Progress Notes (Signed)
Patient rated her anxiety level 3/10 and her depression level 1/10 with 10 being the highest and 0 none. Patient noted to be irritable, frequently asking different Nurses to be discharged. Pt stated, "I have to go home to take care of my kids". Medication and group compliant. Pt observed interacting well with some peers. Pt denied SI/HI and AVH. Appetite good on shift. Safety maintained.  06/18/23 0835  Psych Admission Type (Psych Patients Only)  Admission Status Voluntary  Psychosocial Assessment  Patient Complaints Anxiety;Depression  Eye Contact Fair  Facial Expression Anxious;Sad  Affect Anxious;Depressed  Speech Soft  Interaction Assertive  Motor Activity Other (Comment) (Q 15 Minute observation)  Appearance/Hygiene Unremarkable  Behavior Characteristics Cooperative;Appropriate to situation  Mood Depressed;Anxious  Thought Process  Coherency WDL  Content WDL  Delusions None reported or observed  Perception WDL  Hallucination None reported or observed  Judgment Limited  Confusion None  Danger to Self  Current suicidal ideation? Denies  Self-Injurious Behavior No self-injurious ideation or behavior indicators observed or expressed   Agreement Not to Harm Self Yes  Description of Agreement Verbal  Danger to Others  Danger to Others None reported or observed

## 2023-06-18 NOTE — H&P (Cosign Needed Addendum)
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 Observation Level : q15 minute checks Vital signs: q12 hours Precautions: suicide, but pt currently verbally contracts for safety on unit    Discharge Planning: Social work and case management to assist with discharge planning and identification of hospital follow-up needs prior to discharge Estimated LOS: 5-7 days Discharge Concerns: Need to establish a safety plan; Medication compliance and effectiveness Discharge Goals: Return home with outpatient referrals for mental health follow-up including  medication management/psychotherapy.  Long Term Goal(s): Improvement in symptoms so as ready for discharge  Short Term Goals: Ability to identify changes in lifestyle to reduce recurrence of condition will improve, Ability to verbalize feelings will improve, Ability to disclose and discuss suicidal ideas, Ability to demonstrate self-control will improve, and Ability to identify and develop effective coping behaviors will improve  Physician Treatment Plan for Secondary Diagnosis: Principal Problem:   MDD (major depressive disorder)  I certify that inpatient services furnished can reasonably be expected to improve the patient's condition.    Cecilie Lowers, FNP 12/16/20245:45 PM

## 2023-06-18 NOTE — Plan of Care (Signed)
  Problem: Education: Goal: Knowledge of [facility] Education information/materials will improve Outcome: Progressing Goal: Emotional status will improve Outcome: Progressing Goal: Mental status will improve Outcome: Progressing Goal: Verbalization of understanding the information provided will improve Outcome: Progressing   

## 2023-06-18 NOTE — BH IP Treatment Plan (Signed)
Interdisciplinary Treatment and Diagnostic Plan Update  06/18/2023 Time of Session: 10:45 am Brandi [name] MRN: [id number]  Principal Diagnosis: MDD (major depressive disorder)  Secondary Diagnoses: Principal Problem:   MDD (major depressive disorder)   Current Medications:  Current Facility-Administered Medications  Medication Dose Route Frequency Provider Last Rate Last Admin   acetaminophen (TYLENOL) tablet 650 mg  650 mg Oral Q6H PRN White, Patrice L, NP   650 mg at 06/17/23 1923   alum & mag hydroxide-simeth (MAALOX/MYLANTA) 200-200-20 MG/5ML suspension 30 mL  30 mL Oral Q4H PRN White, Patrice L, NP       amLODipine (NORVASC) tablet 5 mg  5 mg Oral Daily White, Patrice L, NP   5 mg at 06/18/23 0800   haloperidol (HALDOL) tablet 5 mg  5 mg Oral TID PRN White, Patrice L, NP       And   diphenhydrAMINE (BENADRYL) capsule 50 mg  50 mg Oral TID PRN White, Patrice L, NP       haloperidol lactate (HALDOL) injection 5 mg  5 mg Intramuscular TID PRN White, Patrice L, NP       And   diphenhydrAMINE (BENADRYL) injection 50 mg  50 mg Intramuscular TID PRN White, Patrice L, NP       And   LORazepam (ATIVAN) injection 2 mg  2 mg Intramuscular TID PRN White, Patrice L, NP       haloperidol lactate (HALDOL) injection 10 mg  10 mg Intramuscular TID PRN White, Patrice L, NP       And   diphenhydrAMINE (BENADRYL) injection 50 mg  50 mg Intramuscular TID PRN White, Patrice L, NP       And   LORazepam (ATIVAN) injection 2 mg  2 mg Intramuscular TID PRN White, Patrice L, NP       hydrOXYzine (ATARAX) tablet 25 mg  25 mg Oral TID PRN White, Patrice L, NP   25 mg at 06/17/23 1923   magnesium hydroxide (MILK OF MAGNESIA) suspension 30 mL  30 mL Oral Daily PRN White, Patrice L, NP       sertraline (ZOLOFT) tablet 25 mg  25 mg Oral Daily White, Patrice L, NP   25 mg at 06/18/23 0800   traZODone (DESYREL) tablet 50 mg  50 mg Oral QHS PRN White, Patrice L, NP   50 mg at 06/17/23 2126   PTA  Medications: Medications Prior to Admission  Medication Sig Dispense Refill Last Dose/Taking   Cholecalciferol (VITAMIN D) 125 MCG (5000 UT) CAPS Take 1 capsule by mouth daily after breakfast. 30 capsule 11 Past Week   amLODipine (NORVASC) 5 MG tablet Take 1 tablet (5 mg total) by mouth daily. 30 tablet 2    sertraline (ZOLOFT) 25 MG tablet Take 1 tablet (25 mg total) by mouth daily after supper. (Patient not taking: Reported on 06/14/2023) 30 tablet 6     Patient Stressors: Financial difficulties   Marital or family conflict    Patient Strengths: Average or above average Copy for treatment/growth  Physical Health   Treatment Modalities: Medication Management, Group therapy, Case management,  1 to 1 session with clinician, Psychoeducation, Recreational therapy.   Physician Treatment Plan for Primary Diagnosis: MDD (major depressive disorder) Long Term Goal(s):     Short Term Goals:    Medication Management: Evaluate patient's response, side effects, and tolerance of medication regimen.  Therapeutic Interventions: 1 to 1 sessions, Unit Group sessions and Medication administration.  Evaluation of Outcomes:  Not Progressing  Physician Treatment Plan for Secondary Diagnosis: Principal Problem:   MDD (major depressive disorder)  Long Term Goal(s):     Short Term Goals:       Medication Management: Evaluate patient's response, side effects, and tolerance of medication regimen.  Therapeutic Interventions: 1 to 1 sessions, Unit Group sessions and Medication administration.  Evaluation of Outcomes: Not Progressing   RN Treatment Plan for Primary Diagnosis: MDD (major depressive disorder) Long Term Goal(s): Knowledge of disease and therapeutic regimen to maintain health will improve  Short Term Goals: Ability to remain free from injury will improve, Ability to verbalize frustration and anger appropriately will improve, Ability to  demonstrate self-control, Ability to participate in decision making will improve, Ability to verbalize feelings will improve, Ability to disclose and discuss suicidal ideas, Ability to identify and develop effective coping behaviors will improve, and Compliance with prescribed medications will improve  Medication Management: RN will administer medications as ordered by provider, will assess and evaluate patient's response and provide education to patient for prescribed medication. RN will report any adverse and/or side effects to prescribing provider.  Therapeutic Interventions: 1 on 1 counseling sessions, Psychoeducation, Medication administration, Evaluate responses to treatment, Monitor vital signs and CBGs as ordered, Perform/monitor CIWA, COWS, AIMS and Fall Risk screenings as ordered, Perform wound care treatments as ordered.  Evaluation of Outcomes: Not Progressing   LCSW Treatment Plan for Primary Diagnosis: MDD (major depressive disorder) Long Term Goal(s): Safe transition to appropriate next level of care at discharge, Engage patient in therapeutic group addressing interpersonal concerns.  Short Term Goals: Engage patient in aftercare planning with referrals and resources, Increase social support, Increase ability to appropriately verbalize feelings, Increase emotional regulation, and Increase skills for wellness and recovery  Therapeutic Interventions: Assess for all discharge needs, 1 to 1 time with Social worker, Explore available resources and support systems, Assess for adequacy in community support network, Educate family and significant other(s) on suicide prevention, Complete Psychosocial Assessment, Interpersonal group therapy.  Evaluation of Outcomes: Not Progressing   Progress in Treatment: Attending groups: Yes. Participating in groups: Yes. Taking medication as prescribed: Yes. Toleration medication: Yes. Family/Significant other contact made: No, will contact:   Tuzzolino,samir (Spouse)  [phone] Patient understands diagnosis: Yes. Discussing patient identified problems/goals with staff: Yes. Medical problems stabilized or resolved: Yes. Denies suicidal/homicidal ideation: Yes. Issues/concerns per patient self-inventory: No. Other: none reported  New problem(s) identified: No, Describe:  none reported  New Short Term/Long Term Goal(s): medication stabilization, elimination of SI thoughts, development of comprehensive mental wellness plan.    Patient Goals:  " I want to explore resources to get on my feet, I do not work, I would also live to have medication management"   Discharge Plan or Barriers: Patient recently admitted. CSW will continue to follow and assess for appropriate referrals and possible discharge planning.    Reason for Continuation of Hospitalization: Anxiety Depression Suicidal ideation  Estimated Length of Stay: 5-7 days  Last 3 [place] Suicide Severity Risk Score: Flowsheet Row [facility] Most recent reading at 06/17/2023  6:15 PM ED from 06/17/2023 in [facility] Most recent reading at 06/17/2023  3:09 PM Admission (Discharged) from 04/26/2023 in [facility] [place] [facility] Most recent reading at 04/26/2023  7:22 PM  C-SSRS RISK CATEGORY High Risk High Risk No Risk       Last PHQ 2/9 Scores:    02/07/2023    5:13  PM 12/09/2021   11:19 AM 11/08/2021    4:11 PM  Depression screen PHQ 2/9  Decreased Interest 2 0 0  Down, Depressed, Hopeless 1 0 1  PHQ - 2 Score 3 0 1  Altered sleeping 3    Tired, decreased energy 1    Change in appetite 0    Feeling bad or failure about yourself  1    Trouble concentrating 0    Moving slowly or fidgety/restless 0    Suicidal thoughts 0    PHQ-9 Score 8      Scribe for Treatment Team: Kathrynn Humble 06/18/2023 3:14 PM

## 2023-06-18 NOTE — BHH Suicide Risk Assessment (Signed)
Suicide Risk Assessment  Admission Assessment    [facility] Admission Suicide Risk Assessment   Nursing information obtained from:  Patient Demographic factors:  Unemployed, Low socioeconomic status Current Mental Status:  NA Loss Factors:  Financial problems / change in socioeconomic status Historical Factors:  NA Risk Reduction Factors:  Sense of responsibility to family, Pregnancy, Living with another person, especially a relative  Total Time spent with patient: 30 minutes Principal Problem: MDD (major depressive disorder) Diagnosis:  Principal Problem:   MDD (major depressive disorder) MDD (major depressive disorder) PTSD Anxiety Grief disorder  Subjective Data: Brandi [name] is a 27 year old African-American female who is 6 weeks postpartum with prior psychiatric diagnoses significant for history of depression, PTSD, grief and anxiety who presents voluntarily to [facility] from [facility] for worsening depression resulting in suicidal attempts by ingesting ibuprofen. After medical evaluation/stabilization & clearance, she was transferred to the [facility] for further psychiatric evaluation & treatments.   Continued Clinical Symptoms:  Alcohol Use Disorder Identification Test Final Score (AUDIT): 0 The "Alcohol Use Disorders Identification Test", Guidelines for Use in Primary Care, Second Edition.  World Science writer [facility]). Score between 0-7:  no or low risk or alcohol related problems. Score between 8-15:  moderate risk of alcohol related problems. Score between 16-19:  high risk of alcohol related problems. Score 20 or above:  warrants further diagnostic evaluation for alcohol dependence and treatment.  CLINICAL FACTORS:   Severe Anxiety and/or Agitation Depression:   Anhedonia Hopelessness Impulsivity Insomnia Severe Alcohol/Substance Abuse/Dependencies More than one psychiatric diagnosis Unstable or Poor Therapeutic Relationship Previous Psychiatric Diagnoses and  Treatments Medical Diagnoses and Treatments/Surgeries  Musculoskeletal: Strength & Muscle Tone: within normal limits Gait & Station: normal Patient leans: N/A  Psychiatric Specialty Exam:  Presentation  General Appearance:  Appropriate for Environment; Casual; Fairly Groomed  Eye Contact: Fair  Speech: Clear and Coherent  Speech Volume: Normal  Handedness: Right  Mood and Affect  Mood: Depressed; Anxious  Affect: Congruent  Thought Process  Thought Processes: Coherent  Descriptions of Associations:Intact  Orientation:Full (Time, Place and Person)  Thought Content:Logical  History of Schizophrenia/Schizoaffective disorder:No  Duration of Psychotic Symptoms:No data recorded Hallucinations:Hallucinations: None  Ideas of Reference:None  Suicidal Thoughts:Suicidal Thoughts: No  Homicidal Thoughts:Homicidal Thoughts: No  Sensorium  Memory: Immediate Good; Recent Good  Judgment: Fair  Insight: Fair  Art therapist  Concentration: Fair  Attention Span: Fair  Recall: [employer] of Knowledge: Fair  Language: Fair  Psychomotor Activity  Psychomotor Activity: Psychomotor Activity: Normal  Assets  Assets: Communication Skills; Desire for Improvement; Housing; Physical Health; Resilience  Sleep  Sleep: Sleep: Good Number of Hours of Sleep: 6.5  Physical Exam: Physical Exam Vitals and nursing note reviewed.  HENT:     Head: Normocephalic.     Nose: Nose normal.     Mouth/Throat:     Mouth: Mucous membranes are moist.  Eyes:     Extraocular Movements: Extraocular movements intact.  Cardiovascular:     Rate and Rhythm: Bradycardia present.  Pulmonary:     Effort: Pulmonary effort is normal.  Abdominal:     Comments: Deferred  Genitourinary:    Comments: Deferred Musculoskeletal:        General: Normal range of motion.     Cervical back: Normal range of motion.  Skin:    General: Skin is warm.  Neurological:      General: No focal deficit present.     Mental Status: She is alert and oriented to  person, place, and time.  Psychiatric:        Mood and Affect: Mood normal.        Behavior: Behavior normal.    Review of Systems  Constitutional:  Negative for chills and fever.  HENT:  Negative for sore throat.   Eyes:  Negative for blurred vision.  Respiratory:  Negative for cough, sputum production, shortness of breath and wheezing.   Cardiovascular:  Negative for chest pain and palpitations.  Gastrointestinal:  Negative for heartburn, nausea and vomiting.  Genitourinary:  Negative for dysuria, frequency and urgency.  Musculoskeletal:  Negative for myalgias.  Skin:  Negative for itching and rash.  Neurological:  Negative for dizziness, tingling, tremors, seizures and headaches.  Endo/Heme/Allergies:        See allergy listing  Psychiatric/Behavioral:  Positive for depression and substance abuse. The patient is nervous/anxious.    Blood pressure (!) 127/94, pulse (!) 59, temperature 98.3 F (36.8 C), temperature source Oral, resp. rate 16, height 5\' 2"  (1.575 m), weight 90.3 kg, SpO2 100%, not currently breastfeeding. Body mass index is 36.4 kg/m.  COGNITIVE FEATURES THAT CONTRIBUTE TO RISK:  Polarized thinking    SUICIDE RISK:   Severe:  Frequent, intense, and enduring suicidal ideation, specific plan, no subjective intent, but some objective markers of intent (i.e., choice of lethal method), the method is accessible, some limited preparatory behavior, evidence of impaired self-control, severe dysphoria/symptomatology, multiple risk factors present, and few if any protective factors, particularly a lack of social support.  PLAN OF CARE: Physician Treatment Plan for Primary Diagnosis:  Assessment: Brandi [name] is a 27 year old African-American female who is 6 weeks postpartum with prior psychiatric diagnoses significant for history of depression, PTSD, grief and anxiety who presents  voluntarily [facility] for worsening depression resulting in suicidal attempts by ingesting ibuprofen.    MDD (major depressive disorder) PTSD Anxiety Grief disorder  Plan: Medications: Resume Zoloft 25 mg p.o. daily for depression and anxiety.  Plan is to increase Zoloft to 50 mg p.o. daily starting 06/19/2023 Continue trazodone 50 mg p.o. nightly as needed for insomnia Continue hydroxyzine 25 mg p.o. 3 times daily as needed for anxiety Continue Norvasc 5 mg p.o. daily for high blood pressure Continue vitamin D 5000 international unit 1 capsule by mouth daily after breakfast   Agitation protocol: Benadryl capsule 50 mg p.o. or IM 3 times daily as needed agitation   Haldol tablets 5 mg po IM 3 times daily as needed agitation   Lorazepam tablet 2 mg p.o. or IM 3 times daily as needed agitation     Other PRN Medications -Acetaminophen 650 mg every 6 as needed/mild pain -Maalox 30 mL oral every 4 as needed/digestion -Magnesium hydroxide 30 mL daily as needed/mild constipation   -- The risks/benefits/side-effects/alternatives to this medication were discussed in detail with the patient and time was given for questions. The patient consents to medication trial.  -- Metabolic profile and EKG monitoring obtained while on an atypical antipsychotic (BMI: Lipid Panel: HbgA1c: QTc:)  -- Encouraged patient to participate in unit milieu and in scheduled group therapies    Admission labs reviewed: CMP: Creatinine 1.17 high, total bilirubin 1.3 high.  No deranged electrolytes.  Lipid profile: Cholesterol 298 high, LDL 197 high.  CBC with differential: Within normal limits.  Acetaminophen: Less than 10.  Salicylates: Less than 7.  Hemoglobin A1c: 5.0 within normal limits.  TSH: 0.783 within normal limits.  UDS: Positive for marijuana.   New  labs ordered: None   EKG reviewed: Normal sinus rhythm, ventricular rate 60, QT/QTc 378/378   Safety and Monitoring: Voluntary admission  to inpatient psychiatric unit for safety, stabilization and treatment Daily contact with patient to assess and evaluate symptoms and progress in treatment Patient's case to be discussed in multi-disciplinary team meeting Observation Level : q15 minute checks Vital signs: q12 hours Precautions: suicide, but pt currently verbally contracts for safety on unit    Discharge Planning: Social work and case management to assist with discharge planning and identification of hospital follow-up needs prior to discharge Estimated LOS: 5-7 days Discharge Concerns: Need to establish a safety plan; Medication compliance and effectiveness Discharge Goals: Return home with outpatient referrals for mental health follow-up including medication management/psychotherapy.   Long Term Goal(s): Improvement in symptoms so as ready for discharge   Short Term Goals: Ability to identify changes in lifestyle to reduce recurrence of condition will improve, Ability to verbalize feelings will improve, Ability to disclose and discuss suicidal ideas, Ability to demonstrate self-control will improve, and Ability to identify and develop effective coping behaviors will improve   Physician Treatment Plan for Secondary Diagnosis: Principal Problem:   MDD (major depressive disorder)  I certify that inpatient services furnished can reasonably be expected to improve the patient's condition.   Cecilie Lowers, FNP 06/18/2023, 5:40 PM
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
## 2023-06-18 NOTE — Plan of Care (Signed)
  Problem: Education: Goal: Emotional status will improve Outcome: Progressing   Problem: Education: Goal: Mental status will improve Outcome: Progressing   Problem: Activity: Goal: Interest or engagement in activities will improve Outcome: Progressing Goal: Sleeping patterns will improve Outcome: Progressing   Problem: Safety: Goal: Periods of time without injury will increase Outcome: Progressing   Problem: Physical Regulation: Goal: Ability to maintain clinical measurements within normal limits will improve Outcome: Progressing

## 2023-06-18 NOTE — Plan of Care (Signed)
  Problem: Education: Goal: Emotional status will improve Outcome: Progressing Goal: Mental status will improve Outcome: Progressing Goal: Verbalization of understanding the information provided will improve Outcome: Progressing   Problem: Activity: Goal: Interest or engagement in activities will improve Outcome: Progressing Goal: Sleeping patterns will improve Outcome: Progressing

## 2023-06-18 NOTE — Group Note (Signed)
Recreation Therapy Group Note   Group Topic:Stress Management  Group Date: 06/18/2023 Start Time: 0931 End Time: 0951 Facilitators: Elton Catalano-McCall, LRT,CTRS Location: [address] Dayroom   Group Topic: Stress Management  Goal Area(s) Addresses:  Patient will identify positive stress management techniques. Patient will identify benefits of using stress management post d/c.  Group Description: Meditation. LRT engaged with patients about the benefits of meditation. LRT then played a meditation for patients that focused on self renewal and building confidence in ones self.    Education:  Stress Management, Discharge Planning.   Education Outcome: Acknowledges Education   Affect/Mood: Appropriate   Participation Level: Active   Participation Quality: Independent   Behavior: Appropriate   Speech/Thought Process: Focused   Insight: Good   Judgement: Good   Modes of Intervention: Meditation   Patient Response to Interventions:  Engaged   Education Outcome:  In group clarification offered    Clinical Observations/Individualized Feedback: Pt attended and participated in group session.      Plan: Continue to engage patient in RT group sessions 2-3x/week.   Brandi [name], LRT,CTRS 06/18/2023 12:36 PM

## 2023-06-18 NOTE — Progress Notes (Signed)
   06/18/23 0200  Psych Admission Type (Psych Patients Only)  Admission Status Voluntary  Psychosocial Assessment  Patient Complaints Anxiety;Depression  Eye Contact Brief  Facial Expression Anxious;Flat  Affect Anxious  Speech Soft  Interaction Assertive  Motor Activity Other (Comment) (WNL)  Appearance/Hygiene Unremarkable  Behavior Characteristics Cooperative;Appropriate to situation  Mood Depressed;Anxious  Thought Process  Coherency WDL  Content WDL  Delusions None reported or observed  Perception WDL  Hallucination None reported or observed  Judgment Limited  Confusion None  Danger to Self  Current suicidal ideation? Denies  Self-Injurious Behavior No self-injurious ideation or behavior indicators observed or expressed   Agreement Not to Harm Self Yes  Description of Agreement Verbal Agreement  Danger to Others  Danger to Others None reported or observed

## 2023-06-18 NOTE — Group Note (Signed)
Date:  06/18/2023 Time:  9:04 AM  Group Topic/Focus:  Goals Group:   The focus of this group is to help patients establish daily goals to achieve during treatment and discuss how the patient can incorporate goal setting into their daily lives to aide in recovery. Orientation:   The focus of this group is to educate the patient on the purpose and policies of crisis stabilization and provide a format to answer questions about their admission.  The group details unit policies and expectations of patients while admitted.    Participation Level:  Active  Participation Quality:  Appropriate  Affect:  Appropriate  Cognitive:  Appropriate  Insight: Appropriate  Engagement in Group:  Engaged  Modes of Intervention:  Discussion  Additional Comments:    Brandi [name] [name] 06/18/2023, 9:04 AM

## 2023-06-19 DIAGNOSIS — F332 Major depressive disorder, recurrent severe without psychotic features: Secondary | ICD-10-CM | POA: Diagnosis not present

## 2023-06-19 MED ORDER — SERTRALINE HCL 50 MG PO TABS
50.0000 mg | ORAL_TABLET | Freq: Every day | ORAL | Status: DC
  Administered 2023-06-19 – 2023-06-20 (×2): 50 mg via ORAL
  Filled 2023-06-19 (×4): qty 1

## 2023-06-19 NOTE — Plan of Care (Signed)
  Problem: Education: Goal: Emotional status will improve Outcome: Progressing Goal: Mental status will improve Outcome: Progressing Goal: Verbalization of understanding the information provided will improve Outcome: Progressing   Problem: Activity: Goal: Interest or engagement in activities will improve Outcome: Progressing Goal: Sleeping patterns will improve Outcome: Progressing

## 2023-06-19 NOTE — Plan of Care (Signed)
°  Problem: Education: °Goal: Emotional status will improve °Outcome: Progressing °Goal: Mental status will improve °Outcome: Progressing °Goal: Verbalization of understanding the information provided will improve °Outcome: Progressing °  °

## 2023-06-19 NOTE — [facility] Group Notes (Signed)
[facility] Group Notes:  (Nursing/MHT/Case Management/Adjunct)  Date:  06/19/2023  Time:  2000  Type of Therapy:   Wrap up group  Participation Level:  Active  Participation Quality:  Appropriate and Attentive  Affect:  Appropriate  Cognitive:  Alert and Appropriate  Insight:  Appropriate  Engagement in Group:  Engaged  Modes of Intervention:  Education and Support  Summary of Progress/Problems: Pt. goal were to not think about what's going on outside and to focus on herself.   Fay Records 06/19/2023, 9:23 PM

## 2023-06-19 NOTE — Group Note (Signed)
Recreation Therapy Group Note   Group Topic:Animal Assisted Therapy   Group Date: 06/19/2023 Start Time: 0950 End Time: 1030 Facilitators: Derricka Mertz-McCall, LRT,CTRS Location: [address] Dayroom   Animal-Assisted Activity (AAA) Program Checklist/Progress Notes Patient Eligibility Criteria Checklist & Daily Group note for Rec Tx Intervention  AAA/T Program Assumption of Risk Form signed by Patient/ or Parent Legal Guardian Yes  Patient is free of allergies or severe asthma Yes  Patient reports no fear of animals Yes  Patient reports no history of cruelty to animals Yes  Patient understands his/her participation is voluntary Yes  Patient washes hands before animal contact Yes  Patient washes hands after animal contact Yes  Education: Hand Washing, Appropriate Animal Interaction   Education Outcome: Acknowledges education.    Affect/Mood: Appropriate   Participation Level: Engaged   Participation Quality: Independent   Behavior: Appropriate   Speech/Thought Process: Focused   Insight: Good   Judgement: Good   Modes of Intervention: Teaching laboratory technician   Patient Response to Interventions:  Engaged   Education Outcome:  In group clarification offered    Clinical Observations/Individualized Feedback:  Patient attended session and interacted appropriately with therapy dog and peers. Patient asked appropriate questions about therapy dog and his training. Patient shared stories about their pets at home with group.     Plan: Continue to engage patient in RT group sessions 2-3x/week.   Renardo Cheatum-McCall, LRT,CTRS 06/19/2023 1:15 PM

## 2023-06-19 NOTE — Progress Notes (Signed)
[facility] MD Progress Note  06/19/2023 3:57 PM Alaney Fiegl  MRN:  [id number]  Principal Problem: MDD (major depressive disorder) Diagnosis: Principal Problem:   MDD (major depressive disorder)  Reason for admission:  Brandi [name] is a 27 year old African-American female who is 6 weeks postpartum with prior psychiatric diagnoses significant for history of depression, PTSD, grief and anxiety who presents voluntarily to [facility] from [facility] for worsening depression resulting in suicidal attempts by ingesting ibuprofen.   Patient's assessment notes: On assessment today, the pt reports that her mood is euthymic, improved since admission, and stable. Denies feeling down, depressed, or sad.  During this admission, patient was able to participate in therapeutic milieu, unit group activities, and going to meals with the other patients in the dining room.  She reports no delusional thinking or paranoia.  She further denies SI, HI, or AVH.  Patient has fair judgment and good insight.  Denies any physical pain or discomfort at this time. Reports that anxiety symptoms are at manageable level.  Sleep is stable. Appetite is stable.  Concentration is without complaint.  Energy level is adequate. Denies having any suicidal thoughts. Denies having any suicidal intent and plan.  Denies having any HI.  Denies having psychotic symptoms.   Denies having side effects to current psychiatric medications.   Discussed discharge planning:  : How to identify the signs of impending crisis, use of internal coping strategies, reaching out to friends and family that can help navigate a crisis, and a list of mental health professionals and agencies to call. Further to follow up on her mental health appointments and her PCP appointments.  Collateral information: Patient's spouse Nakhia Hamburger, called at [phone] per patient's permission, for more information regarding patient's discharge on  06/20/2023.  Moishe Spice reports that patient is okay to return home to him and the children.  However, requesting a letter from the social workers for his 3 days missed of work.  Total Time spent with patient: 45 minutes  Past Psychiatric History: Previous Psych Diagnoses: Major depressive disorder and anxiety Prior inpatient treatment: Denies Current/prior outpatient treatment: Seeing an online therapist at [facility] Prior rehab hx: Denies Psychotherapy hx: Yes History of suicide: Denies History of homicide or aggression: Denies Psychiatric medication history: Zoloft Psychiatric medication compliance history: Compliance Neuromodulation history: Denies Current Psychiatrist: Denies Current therapist: Yes online therapies  Past Medical History:  Past Medical History:  Diagnosis Date   Anxiety    COVID-19 virus infection 07/13/2021   Depression    HSV-1 infection    PCOS (polycystic ovarian syndrome) 09/21/2022   Preeclampsia 07/13/2021   PTSD (post-traumatic stress disorder)    Tinea corporis 01/11/2021   UTI (urinary tract infection)     Past Surgical History:  Procedure Laterality Date   HERNIA REPAIR  07/03/2006   HERNIA REPAIR  03/30/2022   Family History:  Family History  Problem Relation Age of Onset   Heart disease Mother    Stroke Mother    Hypertension Mother    Bipolar disorder Mother    Healthy Father    Anxiety disorder Sister    Depression Sister    Hypertension Sister    Hypertension Maternal Grandmother    Lung cancer Maternal Grandmother    Stroke Maternal Grandfather    Colon cancer Maternal Grandfather    Depression Paternal Grandmother    Hypertension Paternal Grandmother    Family Psychiatric  History: See H&P  Social History:  Social History   Substance and Sexual  Activity  Alcohol Use Not Currently   Comment: occasional prior to pregnancy     Social History   Substance and Sexual Activity  Drug Use Never    Social History    Socioeconomic History   Marital status: Married    Spouse name: Samir   Number of children: 1   Years of education: Not on file   Highest education level: Associate degree: academic program  Occupational History   Occupation: unemployed  Tobacco Use   Smoking status: Former    Current packs/day: 0.50    Types: Cigarettes    Passive exposure: Never   Smokeless tobacco: Never  Vaping Use   Vaping status: Never Used  Substance and Sexual Activity   Alcohol use: Not Currently    Comment: occasional prior to pregnancy   Drug use: Never   Sexual activity: Not Currently    Birth control/protection: None  Other Topics Concern   Not on file  Social History Narrative   Not on file   Social Drivers of Health   Financial Resource Strain: Not on file  Food Insecurity: Patient Unable To Answer (06/17/2023)   Hunger Vital Sign    Worried About Running Out of Food in the Last Year: Patient unable to answer    Ran Out of Food in the Last Year: Patient unable to answer  Transportation Needs: No Transportation Needs (06/18/2023)   PRAPARE - Administrator, Civil Service (Medical): No    Lack of Transportation (Non-Medical): No  Physical Activity: Not on file  Stress: Not on file  Social Connections: Not on file   Additional Social History:    Sleep: Good  Appetite:  Good  Current Medications: Current Facility-Administered Medications  Medication Dose Route Frequency Provider Last Rate Last Admin   acetaminophen (TYLENOL) tablet 650 mg  650 mg Oral Q6H PRN White, Patrice L, NP   650 mg at 06/17/23 1923   alum & mag hydroxide-simeth (MAALOX/MYLANTA) 200-200-20 MG/5ML suspension 30 mL  30 mL Oral Q4H PRN White, Patrice L, NP       amLODipine (NORVASC) tablet 5 mg  5 mg Oral Daily White, Patrice L, NP   5 mg at 06/19/23 1093   haloperidol (HALDOL) tablet 5 mg  5 mg Oral TID PRN White, Patrice L, NP       And   diphenhydrAMINE (BENADRYL) capsule 50 mg  50 mg Oral TID  PRN White, Patrice L, NP       haloperidol lactate (HALDOL) injection 5 mg  5 mg Intramuscular TID PRN White, Patrice L, NP       And   diphenhydrAMINE (BENADRYL) injection 50 mg  50 mg Intramuscular TID PRN White, Patrice L, NP       And   LORazepam (ATIVAN) injection 2 mg  2 mg Intramuscular TID PRN White, Patrice L, NP       haloperidol lactate (HALDOL) injection 10 mg  10 mg Intramuscular TID PRN White, Patrice L, NP       And   diphenhydrAMINE (BENADRYL) injection 50 mg  50 mg Intramuscular TID PRN White, Patrice L, NP       And   LORazepam (ATIVAN) injection 2 mg  2 mg Intramuscular TID PRN White, Patrice L, NP       hydrOXYzine (ATARAX) tablet 25 mg  25 mg Oral TID PRN White, Patrice L, NP   25 mg at 06/18/23 2245   magnesium hydroxide (MILK OF MAGNESIA) suspension 30 mL  30 mL Oral Daily PRN White, Patrice L, NP       sertraline (ZOLOFT) tablet 50 mg  50 mg Oral Daily Jordanna Hendrie C, FNP   50 mg at 06/19/23 7829   traZODone (DESYREL) tablet 50 mg  50 mg Oral QHS PRN White, Patrice L, NP   50 mg at 06/18/23 2120   Lab Results: No results found for this or any previous visit (from the past 48 hours).  Blood Alcohol level:  Lab Results  Component Value Date   ETH <10 06/17/2023   Metabolic Disorder Labs: Lab Results  Component Value Date   HGBA1C 5.0 06/17/2023   MPG 96.8 06/17/2023   No results found for: "PROLACTIN" Lab Results  Component Value Date   CHOL 298 (H) 06/17/2023   TRIG 76 06/17/2023   HDL 86 06/17/2023   CHOLHDL 3.5 06/17/2023   VLDL 15 06/17/2023   LDLCALC 197 (H) 06/17/2023    Physical Findings: AIMS:  , ,  ,  ,    CIWA:    COWS:     Musculoskeletal: Strength & Muscle Tone: within normal limits Gait & Station: normal Patient leans: N/A  Psychiatric Specialty Exam:  Presentation  General Appearance:  Appropriate for Environment; Casual; Fairly Groomed  Eye Contact: Good  Speech: Clear and Coherent; Normal Rate  Speech  Volume: Normal  Handedness: Right  Mood and Affect  Mood: -- (Improving)  Affect: Congruent  Thought Process  Thought Processes: Coherent  Descriptions of Associations:Intact  Orientation:Full (Time, Place and Person)  Thought Content:Logical  History of Schizophrenia/Schizoaffective disorder:No  Duration of Psychotic Symptoms:No data recorded Hallucinations:Hallucinations: None  Ideas of Reference:None  Suicidal Thoughts:Suicidal Thoughts: No  Homicidal Thoughts:Homicidal Thoughts: No  Sensorium  Memory: Immediate Good; Recent Good  Judgment: Fair  Insight: Fair  Chartered certified accountant: Fair  Attention Span: Fair  Recall: Fair  Fund of Knowledge: Fair  Language: Good  Psychomotor Activity  Psychomotor Activity: Psychomotor Activity: Normal  Assets  Assets: Communication Skills; Desire for Improvement; Housing; Physical Health; Resilience; Social Support  Sleep  Sleep: Sleep: Good Number of Hours of Sleep: 7  Physical Exam: Physical Exam Vitals and nursing note reviewed.  HENT:     Head: Normocephalic.     Nose: Nose normal.     Mouth/Throat:     Mouth: Mucous membranes are moist.  Cardiovascular:     Rate and Rhythm: Normal rate.     Pulses: Normal pulses.  Abdominal:     Comments: Deferred  Genitourinary:    Comments: Deferred Musculoskeletal:        General: Normal range of motion.     Cervical back: Normal range of motion.  Skin:    General: Skin is warm.  Neurological:     General: No focal deficit present.     Mental Status: She is alert and oriented to person, place, and time.  Psychiatric:        Mood and Affect: Mood normal.        Behavior: Behavior normal.        Thought Content: Thought content normal.    Review of Systems  Constitutional:  Negative for chills and fever.  HENT:  Negative for sore throat.   Eyes:  Negative for blurred vision.  Respiratory:  Negative for cough, sputum  production, shortness of breath and wheezing.   Cardiovascular:  Negative for chest pain and palpitations.  Gastrointestinal:  Negative for abdominal pain, constipation, diarrhea, heartburn, nausea and vomiting.  Genitourinary:  Negative for dysuria, frequency and urgency.  Musculoskeletal:  Negative for myalgias.  Skin:  Negative for itching and rash.  Neurological:  Negative for dizziness, tingling, tremors, sensory change, speech change and headaches.  Endo/Heme/Allergies:        See allergy listing  Psychiatric/Behavioral:  Positive for depression. The patient is nervous/anxious.    Blood pressure (!) 129/91, pulse 84, temperature 98.3 F (36.8 C), temperature source Oral, resp. rate 16, height 5\' 2"  (1.575 m), weight 90.3 kg, SpO2 99%, not currently breastfeeding. Body mass index is 36.4 kg/m.  Treatment Plan Summary: Daily contact with patient to assess and evaluate symptoms and progress in treatment and Medication management Physician Treatment Plan for Primary Diagnosis:  Assessment:  Brandi [name] is a 27 year old African-American female who is 6 weeks postpartum with prior psychiatric diagnoses significant for history of depression, PTSD, grief and anxiety who presents voluntarily to [facility] from [facility] for worsening depression resulting in suicidal attempts by ingesting ibuprofen.   MDD (major depressive disorder)   Plan: Medications: Continue Zoloft 50 mg p.o. daily for depression and anxiety.   Continue trazodone 50 mg p.o. nightly as needed for insomnia Continue hydroxyzine 25 mg p.o. 3 times daily as needed for anxiety Continue Norvasc 5 mg p.o. daily for high blood pressure Continue vitamin D 5000 international unit 1 capsule by mouth daily after breakfast   Agitation protocol: Benadryl capsule 50 mg p.o. or IM 3 times daily as needed agitation   Haldol tablets 5 mg po IM 3 times daily as needed agitation   Lorazepam tablet 2 mg p.o. or IM 3 times daily  as needed agitation     Other PRN Medications -Acetaminophen 650 mg every 6 as needed/mild pain -Maalox 30 mL oral every 4 as needed/digestion -Magnesium hydroxide 30 mL daily as needed/mild constipation   -- The risks/benefits/side-effects/alternatives to this medication were discussed in detail with the patient and time was given for questions. The patient consents to medication trial.  -- Metabolic profile and EKG monitoring obtained while on an atypical antipsychotic (BMI: Lipid Panel: HbgA1c: QTc:)  -- Encouraged patient to participate in unit milieu and in scheduled group therapies    Admission labs reviewed: CMP: Creatinine 1.17 high, total bilirubin 1.3 high.  No deranged electrolytes.  Lipid profile: Cholesterol 298 high, LDL 197 high.  CBC with differential: Within normal limits.  Acetaminophen: Less than 10.  Salicylates: Less than 7.  Hemoglobin A1c: 5.0 within normal limits.  TSH: 0.783 within normal limits.  UDS: Positive for marijuana.   New labs ordered: None   EKG reviewed: Normal sinus rhythm, ventricular rate 60, QT/QTc 378/378   Safety and Monitoring: Voluntary admission to inpatient psychiatric unit for safety, stabilization and treatment Daily contact with patient to assess and evaluate symptoms and progress in treatment Patient's case to be discussed in multi-disciplinary team meeting Observation Level : q15 minute checks Vital signs: q12 hours Precautions: suicide, but pt currently verbally contracts for safety on unit    Discharge Planning: Social work and case management to assist with discharge planning and identification of hospital follow-up needs prior to discharge Estimated LOS: 5-7 days Discharge Concerns: Need to establish a safety plan; Medication compliance and effectiveness Discharge Goals: Return home with outpatient referrals for mental health follow-up including medication management/psychotherapy.   Long Term Goal(s): Improvement in symptoms so  as ready for discharge   Short Term Goals: Ability to identify changes in lifestyle to reduce recurrence of condition will improve, Ability to  verbalize feelings will improve, Ability to disclose and discuss suicidal ideas, Ability to demonstrate self-control will improve, and Ability to identify and develop effective coping behaviors will improve   Physician Treatment Plan for Secondary Diagnosis: Principal Problem:   MDD (major depressive disorder)    Cecilie Lowers, FNP 06/19/2023, 3:57 PM

## 2023-06-19 NOTE — Progress Notes (Signed)
   06/19/23 0958  Psych Admission Type (Psych Patients Only)  Admission Status Voluntary  Psychosocial Assessment  Patient Complaints Depression;Anxiety  Eye Contact Fair  Facial Expression Anxious  Affect Anxious  Speech Soft  Interaction Assertive  Motor Activity Other (Comment) (WDL)  Appearance/Hygiene Unremarkable  Behavior Characteristics Cooperative;Anxious  Mood Depressed;Anxious  Thought Process  Coherency WDL  Content WDL  Delusions None reported or observed  Perception WDL  Hallucination None reported or observed  Judgment Limited  Confusion None  Danger to Self  Current suicidal ideation? Denies  Agreement Not to Harm Self Yes  Description of Agreement Verbal  Danger to Others  Danger to Others None reported or observed

## 2023-06-19 NOTE — Progress Notes (Signed)
   06/19/23 2000  Psych Admission Type (Psych Patients Only)  Admission Status Voluntary  Psychosocial Assessment  Patient Complaints Anxiety;Depression  Eye Contact Brief  Facial Expression Anxious  Affect Anxious  Speech Soft  Interaction Assertive  Motor Activity Other (Comment) (WNL)  Appearance/Hygiene Unremarkable  Behavior Characteristics Cooperative  Mood Depressed;Anxious  Thought Process  Coherency WDL  Content WDL  Delusions None reported or observed  Perception WDL  Hallucination None reported or observed  Judgment Limited  Confusion None  Danger to Self  Current suicidal ideation? Denies  Self-Injurious Behavior No self-injurious ideation or behavior indicators observed or expressed   Agreement Not to Harm Self Yes  Description of Agreement Verbal Agreement  Danger to Others  Danger to Others None reported or observed

## 2023-06-19 NOTE — Group Note (Signed)
LCSW Group Therapy Note  Group Date: 06/19/2023 Start Time: 1100 End Time: 1200   Type of Therapy and Topic:  Group Therapy - Healthy vs Unhealthy Coping Skills  Participation Level:  Minimal   Description of Group The focus of this group was to determine what unhealthy coping techniques typically are used by group members and what healthy coping techniques would be helpful in coping with various problems. Patients were guided in becoming aware of the differences between healthy and unhealthy coping techniques. Patients were asked to identify 2-3 healthy coping skills they would like to learn to use more effectively.  Therapeutic Goals Patients learned that coping is what human beings do all day long to deal with various situations in their lives Patients defined and discussed healthy vs unhealthy coping techniques Patients identified their preferred coping techniques and identified whether these were healthy or unhealthy Patients determined 2-3 healthy coping skills they would like to become more familiar with and use more often. Patients provided support and ideas to each other   Summary of Patient Progress:  During group, Shyrl expressed some feedback. Patient proved open to input from peers and feedback from CSW. Patient demonstrated some insight into the subject matter, was respectful of peers, and participated throughout the entire session.   Therapeutic Modalities Cognitive Behavioral Therapy Motivational Interviewing  Marinda Elk, [place] 06/19/2023  1:23 PM

## 2023-06-20 DIAGNOSIS — F332 Major depressive disorder, recurrent severe without psychotic features: Secondary | ICD-10-CM

## 2023-06-20 MED ORDER — AMLODIPINE BESYLATE 5 MG PO TABS: 5.0000 mg | ORAL_TABLET | Freq: Every day | ORAL | 2 refills | Status: DC

## 2023-06-20 MED ORDER — TRAZODONE HCL 50 MG PO TABS: 50.0000 mg | ORAL_TABLET | Freq: Every evening | ORAL | 0 refills | Status: DC | PRN

## 2023-06-20 MED ORDER — VITAMIN D 125 MCG (5000 UT) PO CAPS: 1.0000 | ORAL_CAPSULE | Freq: Every day | ORAL | 11 refills | Status: DC

## 2023-06-20 MED ORDER — HYDROXYZINE HCL 25 MG PO TABS: 25.0000 mg | ORAL_TABLET | Freq: Three times a day (TID) | ORAL | 0 refills | Status: DC | PRN

## 2023-06-20 MED ORDER — SERTRALINE HCL 50 MG PO TABS: 50.0000 mg | ORAL_TABLET | Freq: Every day | ORAL | 0 refills | Status: DC

## 2023-06-20 NOTE — Plan of Care (Signed)
  Problem: Education: Goal: Emotional status will improve Outcome: Progressing Goal: Mental status will improve Outcome: Progressing   Problem: Activity: Goal: Interest or engagement in activities will improve Outcome: Progressing Goal: Sleeping patterns will improve Outcome: Progressing

## 2023-06-20 NOTE — Progress Notes (Signed)
  [facility] Adult Case Management Discharge Plan :  Will you be returning to the same living situation after discharge:  Yes,  with husband, sister and children At discharge, do you have transportation home?: Yes,  Kaylor Donnally will transport Do you have the ability to pay for your medications: Yes,  has medication  Release of information consent forms completed and in the chart;  Patient's signature needed at discharge.  Patient to Follow up at:  Follow-up Information     [facility] Go on 06/29/2023.   Specialty: Professional Counselor Why: Please go to this provider for therapy services on 06/29/23 at 9:00 am. You may also go any Monday through Friday, from 9 am to 12 pm. Contact information: [address] [place] [address] [phone]         [facility]. Go on 07/03/2023.   Specialty: Behavioral Health Why: Please go to this provider for therapy services on 07/03/23 at 7:00 am. You may also go any Monday through Friday, arrive by 7:00 am for same day services. Contact information: [address] [place] [address] [phone]                Next level of care provider has access to [facility] Link:no  Safety Planning and Suicide Prevention discussed: Yes,  Jeanice Lim, sister     Has patient been referred to the Quitline?: Patient does not use tobacco/nicotine products  Patient has been referred for addiction treatment: No known substance use disorder.  Marinda Elk, LCSW 06/20/2023, 9:32 AM

## 2023-06-20 NOTE — Group Note (Signed)
Date:  06/20/2023 Time:  10:07 AM  Group Topic/Focus:  Developing a Wellness Toolbox:   The focus of this group is to help patients develop a "wellness toolbox" with skills and strategies to promote recovery upon discharge. Goals Group:   The focus of this group is to help patients establish daily goals to achieve during treatment and discuss how the patient can incorporate goal setting into their daily lives to aide in recovery.    Participation Level:  Active  Participation Quality:  Attentive  Affect:  Appropriate  Cognitive:  Appropriate  Insight: Appropriate  Engagement in Group:  Improving  Modes of Intervention:  Education  Additional Comments:  n/a  Kemisha Bonnette E Madilynn Montante 06/20/2023, 10:07 AM
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
## 2023-06-20 NOTE — Progress Notes (Signed)
Brandi [name] discharged from [facility] on 06/20/23. Brandi [name] denies SI, plan, and intention. Suicide safety plan completed, reviewed with this RN, given to the Brandi [name], and a copy in the chart. Brandi [name] denies HI/AVH upon discharge. Brandi [name] is alert, oriented, and cooperative. RN provided Brandi [name] with discharge paperwork and reviewed information with Brandi [name]. Brandi [name] expressed that she understood all of the discharge instructions. Pt was satisfied with belongings returned to her from the locker and at bedside. Discharged Brandi [name] to [facility] waiting room.

## 2023-06-20 NOTE — Discharge Summary (Signed)
Physician Discharge Summary Note  Patient:  Brandi [name] is an 27 y.o., female MRN:  [id number] DOB:  08-Apr-1996 Patient phone:  [phone] (home)  Patient address:   [address] [place] [address],   Total Time spent with patient: 30 minutes  Date of Admission:  06/17/2023 Date of Discharge:   06/20/2023  Reason for Admission:  Brandi [name] is a 27 year old African-American female who is 6 weeks postpartum with prior psychiatric diagnoses significant for history of depression, PTSD, grief and anxiety who presents voluntarily to [facility] from [facility] for worsening depression resulting in suicidal attempts by ingesting ibuprofen.   Principal Problem: MDD (major depressive disorder) Discharge Diagnoses: Principal Problem:   MDD (major depressive disorder)  Past Psychiatric History: Previous Psych Diagnoses: Major depressive disorder and anxiety Prior inpatient treatment: Denies Current/prior outpatient treatment: Seeing an online therapist at [facility] Prior rehab hx: Denies Psychotherapy hx: Yes History of suicide: Denies History of homicide or aggression: Denies Psychiatric medication history: Zoloft Psychiatric medication compliance history: Compliance Neuromodulation history: Denies Current Psychiatrist: Denies Current therapist: Yes online therapies  Past Medical History:  Past Medical History:  Diagnosis Date   Anxiety    COVID-19 virus infection 07/13/2021   Depression    HSV-1 infection    PCOS (polycystic ovarian syndrome) 09/21/2022   Preeclampsia 07/13/2021   PTSD (post-traumatic stress disorder)    Tinea corporis 01/11/2021   UTI (urinary tract infection)     Past Surgical History:  Procedure Laterality Date   HERNIA REPAIR  07/03/2006   HERNIA REPAIR  03/30/2022   Family History:  Family History  Problem Relation Age of Onset   Heart disease Mother    Stroke Mother    Hypertension Mother    Bipolar  disorder Mother    Healthy Father    Anxiety disorder Sister    Depression Sister    Hypertension Sister    Hypertension Maternal Grandmother    Lung cancer Maternal Grandmother    Stroke Maternal Grandfather    Colon cancer Maternal Grandfather    Depression Paternal Grandmother    Hypertension Paternal Grandmother    Family Psychiatric  History: See H&P Social History:  Social History   Substance and Sexual Activity  Alcohol Use Not Currently   Comment: occasional prior to pregnancy     Social History   Substance and Sexual Activity  Drug Use Never    Social History   Socioeconomic History   Marital status: Married    Spouse name: Samir   Number of children: 1   Years of education: Not on file   Highest education level: Associate degree: academic program  Occupational History   Occupation: unemployed  Tobacco Use   Smoking status: Former    Current packs/day: 0.50    Types: Cigarettes    Passive exposure: Never   Smokeless tobacco: Never  Vaping Use   Vaping status: Never Used  Substance and Sexual Activity   Alcohol use: Not Currently    Comment: occasional prior to pregnancy   Drug use: Never   Sexual activity: Not Currently    Birth control/protection: None  Other Topics Concern   Not on file  Social History Narrative   Not on file   Social Drivers of Health   Financial Resource Strain: Not on file  Food Insecurity: Patient Unable To Answer (06/17/2023)   Hunger Vital Sign    Worried About Running Out of Food in the Last Year: Patient  unable to answer    Ran Out of Food in the Last Year: Patient unable to answer  Transportation Needs: No Transportation Needs (06/18/2023)   PRAPARE - Administrator, Civil Service (Medical): No    Lack of Transportation (Non-Medical): No  Physical Activity: Not on file  Stress: Not on file  Social Connections: Not on file   Hospital Course:  During the patient's hospitalization, patient had extensive  initial psychiatric evaluation, and follow-up psychiatric evaluations every day.  Psychiatric diagnoses provided upon initial assessment:   MDD (major depressive disorder)   Patient's psychiatric medications were adjusted on admission:  Resume Zoloft 25 mg p.o. daily for depression and anxiety.  Plan is to increase Zoloft to 50 mg p.o. daily starting 06/19/2023 Continue trazodone 50 mg p.o. nightly as needed for insomnia Continue hydroxyzine 25 mg p.o. 3 times daily as needed for anxiety Continue Norvasc 5 mg p.o. daily for high blood pressure Continue vitamin D 5000 international unit 1 capsule by mouth daily after breakfast  During the hospitalization, other adjustments were made to the patient's psychiatric medication regimen:  Zoloft was increased to 50 mg p.o. daily for depression and anxiety  Patient's care was discussed during the interdisciplinary team meeting every day during the hospitalization.  The patient denies having side effects to prescribed psychiatric medication.  Gradually, patient started adjusting to milieu. The patient was evaluated each day by a clinical provider to ascertain response to treatment. Improvement was noted by the patient's report of decreasing symptoms, improved sleep and appetite, affect, medication tolerance, behavior, and participation in unit programming.  Patient was asked each day to complete a self inventory noting mood, mental status, pain, new symptoms, anxiety and concerns.    Symptoms were reported as significantly decreased or resolved completely by discharge.   On day of discharge, the patient reports that their mood is stable. The patient denied having suicidal thoughts for more than 48 hours prior to discharge.  Patient denies having homicidal thoughts.  Patient denies having auditory hallucinations.  Patient denies any visual hallucinations or other symptoms of psychosis. The patient was motivated to continue taking medication with a goal  of continued improvement in mental health.   The patient reports their target psychiatric symptoms of depression responded well to the psychiatric medications, and the patient reports overall benefit other psychiatric hospitalization. Supportive psychotherapy was provided to the patient. The patient also participated in regular group therapy while hospitalized. Coping skills, problem solving as well as relaxation therapies were also part of the unit programming.  Labs were reviewed with the patient, and abnormal results were discussed with the patient.  The patient is able to verbalize their individual safety plan to this provider.  # It is recommended to the patient to continue psychiatric medications as prescribed, after discharge from the hospital.    # It is recommended to the patient to follow up with your outpatient psychiatric provider and PCP.  # It was discussed with the patient, the impact of alcohol, drugs, tobacco have been there overall psychiatric and medical wellbeing, and total abstinence from substance use was recommended the patient.ed.  # Prescriptions provided or sent directly to preferred pharmacy at discharge. Patient agreeable to plan. Given opportunity to ask questions. Appears to feel comfortable with discharge.    # In the event of worsening symptoms, the patient is instructed to call the crisis hotline, 911 and or go to the nearest ED for appropriate evaluation and treatment of symptoms. To follow-up  with primary care provider for other medical issues, concerns and or health care needs  # Patient was discharged to home with a plan to follow up as noted below.   Physical Findings: AIMS:  , ,  ,  ,    CIWA:    COWS:     Musculoskeletal: Strength & Muscle Tone: within normal limits Gait & Station: normal Patient leans: N/A  Psychiatric Specialty Exam:  Presentation  General Appearance:  Appropriate for Environment; Casual; Fairly Groomed  Eye  Contact: Good  Speech: Clear and Coherent; Normal Rate  Speech Volume: Normal  Handedness: Right  Mood and Affect  Mood: -- (Improving)  Affect: Congruent  Thought Process  Thought Processes: Coherent  Descriptions of Associations:Intact  Orientation:Full (Time, Place and Person)  Thought Content:Logical  History of Schizophrenia/Schizoaffective disorder:No  Duration of Psychotic Symptoms:No data recorded Hallucinations:Hallucinations: None  Ideas of Reference:None  Suicidal Thoughts:Suicidal Thoughts: No  Homicidal Thoughts:Homicidal Thoughts: No  Sensorium  Memory: Immediate Good; Recent Good  Judgment: Fair  Insight: Fair  Art therapist  Concentration: Fair  Attention Span: Fair  Recall: Fair  Fund of Knowledge: Fair  Language: Good  Psychomotor Activity  Psychomotor Activity: Psychomotor Activity: Normal  Assets  Assets: Communication Skills; Desire for Improvement; Housing; Physical Health; Resilience; Social Support  Sleep  Sleep: Sleep: Good Number of Hours of Sleep: 7  Physical Exam: Physical Exam Vitals and nursing note reviewed.  Constitutional:      Appearance: She is obese.  HENT:     Head: Normocephalic.     Nose: Nose normal.     Mouth/Throat:     Mouth: Mucous membranes are moist.  Cardiovascular:     Rate and Rhythm: Normal rate.     Pulses: Normal pulses.  Pulmonary:     Effort: Pulmonary effort is normal.  Abdominal:     Comments: Deferred  Genitourinary:    Comments: Deferred Musculoskeletal:        General: Normal range of motion.     Cervical back: Normal range of motion.  Skin:    General: Skin is warm.  Neurological:     General: No focal deficit present.     Mental Status: She is alert and oriented to person, place, and time.  Psychiatric:        Mood and Affect: Mood normal.        Behavior: Behavior normal.        Thought Content: Thought content normal.    Review of Systems   Constitutional:  Negative for chills and fever.  HENT:  Negative for sore throat.   Eyes:  Negative for blurred vision.  Respiratory:  Negative for cough, sputum production, shortness of breath and wheezing.   Cardiovascular:  Negative for chest pain and palpitations.  Gastrointestinal:  Negative for abdominal pain, constipation, diarrhea, heartburn, nausea and vomiting.  Genitourinary:  Negative for dysuria, frequency and urgency.  Musculoskeletal:  Negative for back pain, falls, joint pain, myalgias and neck pain.  Skin:  Negative for itching and rash.  Neurological:  Negative for dizziness, tingling, tremors, sensory change, speech change and headaches.  Endo/Heme/Allergies:        See allergy listing  Psychiatric/Behavioral:  Positive for depression (Stable with medication). The patient is nervous/anxious (Improved with medication).    Blood pressure (!) 144/98, pulse 72, temperature 98.5 F (36.9 C), temperature source Oral, resp. rate 16, height 5\' 2"  (1.575 m), weight 90.3 kg, SpO2 100%, not currently breastfeeding. Body mass index is 36.4  kg/m.   Social History   Tobacco Use  Smoking Status Former   Current packs/day: 0.50   Types: Cigarettes   Passive exposure: Never  Smokeless Tobacco Never   Tobacco Cessation:  A prescription for an FDA-approved tobacco cessation medication was offered at discharge and the patient refused  Blood Alcohol level:  Lab Results  Component Value Date   ETH <10 06/17/2023   Metabolic Disorder Labs:  Lab Results  Component Value Date   HGBA1C 5.0 06/17/2023   MPG 96.8 06/17/2023   No results found for: "PROLACTIN" Lab Results  Component Value Date   CHOL 298 (H) 06/17/2023   TRIG 76 06/17/2023   HDL 86 06/17/2023   CHOLHDL 3.5 06/17/2023   VLDL 15 06/17/2023   LDLCALC 197 (H) 06/17/2023    See Psychiatric Specialty Exam and Suicide Risk Assessment completed by Attending Physician prior to discharge.  Discharge destination:   Home  Is patient on multiple antipsychotic therapies at discharge:  No   Has Patient had three or more failed trials of antipsychotic monotherapy by history:  No  Recommended Plan for Multiple Antipsychotic Therapies: NA  Discharge Instructions     Increase activity slowly   Complete by: As directed       Allergies as of 06/20/2023   No Known Allergies      Medication List     TAKE these medications      Indication  amLODipine 5 MG tablet Commonly known as: Norvasc Take 1 tablet (5 mg total) by mouth daily.  Indication: High Blood Pressure   hydrOXYzine 25 MG tablet Commonly known as: ATARAX Take 1 tablet (25 mg total) by mouth 3 (three) times daily as needed for anxiety.  Indication: Feeling Anxious   sertraline 50 MG tablet Commonly known as: ZOLOFT Take 1 tablet (50 mg total) by mouth daily. Start taking on: June 21, 2023 What changed:  medication strength how much to take when to take this  Indication: Generalized Anxiety Disorder, Major Depressive Disorder   traZODone 50 MG tablet Commonly known as: DESYREL Take 1 tablet (50 mg total) by mouth at bedtime as needed for sleep.  Indication: Trouble Sleeping   Vitamin D 125 MCG (5000 UT) Caps Take 1 capsule by mouth daily after breakfast.  Indication: Vitamin D Deficiency        Follow-up Information     [facility] Go on 06/29/2023.   Specialty: Professional Counselor Why: Please go to this provider for therapy services on 06/29/23 at 9:00 am. You may also go any Monday through Friday, from 9 am to 12 pm. Contact information: [address] [place] [address] [phone]         [facility]. Go on 07/03/2023.   Specialty: Behavioral Health Why: Please go to this provider for therapy services on 07/03/23 at 7:00 am. You may also go any Monday through Friday, arrive by 7:00 am for same day services. Contact  information: [phone] [place] [address] [phone]               Follow-up recommendations:   Discharge Recommendations:  The patient is being discharged with her family. Patient is to take her discharge medications as ordered.  See follow up above. We recommend that she participates in individual therapy to target uncontrollable agitation and substance abuse.  We recommend that she participates in therapy to target the conflict with her family, to improve communication skills and conflict  resolution skills.  patient is to initiate/implement a contingency based behavioral model to address patient's behavior. We recommend that she gets AIMS scale, height, weight, blood pressure, fasting lipid panel, fasting blood sugar in three months from discharge if she's on atypical antipsychotics.  Patient will benefit from monitoring of recurrent suicidal ideation since patient is on antidepressant medication. The patient should abstain from all illicit substances and alcohol. If the patient's symptoms worsen or do not continue to improve or if the patient becomes actively suicidal or homicidal then it is recommended that the patient return to the closest hospital emergency room or call 911 for further evaluation and treatment. National Suicide Prevention Lifeline 1800-SUICIDE or (928)434-6887. Please follow up with your primary medical doctor for all other medical needs.  The patient has been educated on the possible side effects to medications and she/her guardian is to contact a medical professional and inform outpatient provider of any new side effects of medication. She is to take regular diet and activity as tolerated.  Will benefit from moderate daily exercise. Patient was educated about removing/locking any firearms, medications or dangerous products from the home.  Activity:  As tolerated Diet:  Regular Diet  Signed:  Cecilie Lowers, FNP 06/20/2023, 10:09 AM

## 2023-06-20 NOTE — Progress Notes (Signed)
   06/20/23 0800  Psych Admission Type (Psych Patients Only)  Admission Status Voluntary  Psychosocial Assessment  Patient Complaints None  Eye Contact Fair  Facial Expression Anxious;Flat  Affect Anxious;Depressed  Speech Soft  Interaction Assertive  Motor Activity Slow  Appearance/Hygiene Unremarkable  Behavior Characteristics Cooperative;Appropriate to situation  Mood Depressed;Anxious  Thought Process  Coherency WDL  Content WDL  Delusions None reported or observed  Perception WDL  Hallucination None reported or observed  Judgment Impaired  Confusion None  Danger to Self  Current suicidal ideation? Denies  Description of Suicide Plan No plan  Self-Injurious Behavior No self-injurious ideation or behavior indicators observed or expressed   Agreement Not to Harm Self Yes  Description of Agreement Verbal  Danger to Others  Danger to Others None reported or observed  Danger to Others Abnormal  Harmful Behavior to others No threats or harm toward other people  Destructive Behavior No threats or harm toward property

## 2023-07-04 ENCOUNTER — Encounter (HOSPITAL_COMMUNITY): Payer: Self-pay | Admitting: *Deleted

## 2023-07-04 ENCOUNTER — Other Ambulatory Visit: Payer: Self-pay

## 2023-07-04 ENCOUNTER — Emergency Department (HOSPITAL_COMMUNITY): Payer: Medicaid Other

## 2023-07-04 ENCOUNTER — Emergency Department (HOSPITAL_COMMUNITY)
Admission: EM | Admit: 2023-07-04 | Discharge: 2023-07-04 | Disposition: A | Discharge status: Home / Self Care | Payer: Medicaid Other | Attending: Emergency Medicine | Admitting: Emergency Medicine

## 2023-07-04 DIAGNOSIS — R202 Paresthesia of skin: Secondary | ICD-10-CM | POA: Insufficient documentation

## 2023-07-04 DIAGNOSIS — I10 Essential (primary) hypertension: Secondary | ICD-10-CM | POA: Diagnosis not present

## 2023-07-04 DIAGNOSIS — Z79899 Other long term (current) drug therapy: Secondary | ICD-10-CM | POA: Diagnosis not present

## 2023-07-04 LAB — CBC WITH DIFFERENTIAL/PLATELET
Abs Immature Granulocytes: 0.01 10*3/uL (ref 0.00–0.07)
Basophils Absolute: 0 10*3/uL (ref 0.0–0.1)
Basophils Relative: 0 %
Eosinophils Absolute: 0.1 10*3/uL (ref 0.0–0.5)
Eosinophils Relative: 3 %
HCT: 38.6 % (ref 36.0–46.0)
Hemoglobin: 12.8 g/dL (ref 12.0–15.0)
Immature Granulocytes: 0 %
Lymphocytes Relative: 46 %
Lymphs Abs: 2.4 10*3/uL (ref 0.7–4.0)
MCH: 28 pg (ref 26.0–34.0)
MCHC: 33.2 g/dL (ref 30.0–36.0)
MCV: 84.5 fL (ref 80.0–100.0)
Monocytes Absolute: 0.3 10*3/uL (ref 0.1–1.0)
Monocytes Relative: 6 %
Neutro Abs: 2.3 10*3/uL (ref 1.7–7.7)
Neutrophils Relative %: 45 %
Platelets: 309 10*3/uL (ref 150–400)
RBC: 4.57 MIL/uL (ref 3.87–5.11)
RDW: 13.2 % (ref 11.5–15.5)
WBC: 5.1 10*3/uL (ref 4.0–10.5)
nRBC: 0 % (ref 0.0–0.2)

## 2023-07-04 LAB — COMPREHENSIVE METABOLIC PANEL
ALT: 24 U/L (ref 0–44)
AST: 19 U/L (ref 15–41)
Albumin: 4.3 g/dL (ref 3.5–5.0)
Alkaline Phosphatase: 39 U/L (ref 38–126)
Anion gap: 8 (ref 5–15)
BUN: 12 mg/dL (ref 6–20)
CO2: 24 mmol/L (ref 22–32)
Calcium: 9.4 mg/dL (ref 8.9–10.3)
Chloride: 105 mmol/L (ref 98–111)
Creatinine, Ser: 0.9 mg/dL (ref 0.44–1.00)
GFR, Estimated: >60 mL/min (ref >60)
Glucose, Bld: 94 mg/dL (ref 70–99)
Potassium: 3.5 mmol/L (ref 3.5–5.1)
Sodium: 137 mmol/L (ref 135–145)
Total Bilirubin: 1 mg/dL (ref 0.0–1.2)
Total Protein: 7.5 g/dL (ref 6.5–8.1)

## 2023-07-04 MED ORDER — AMLODIPINE BESYLATE 10 MG PO TABS: 10.0000 mg | ORAL_TABLET | Freq: Every day | ORAL | 0 refills | Status: DC

## 2023-07-04 NOTE — L&D Delivery Note (Signed)
 OB[facility]N [facility] Delivery Note  Brandi [name] is a 28 y.o. [id number] s/p vag delivery at [redacted]w[redacted]d. She was admitted for IOL due to cHTN w severe SIPE (HA/BP).   ROM: 0h 62m with clear fluid GBS Status: unknown (PCN x 3) Maximum Maternal Temperature: 98.4  Labor Progress: Ms Gilliam was admitted for IOL with cHTN now severe SIPE due to occasional severe range BPs and H/A. She received a dual dose of cytotec  and progressed to active labor; mod amt frank blood/clots noted when she was 9cm; AROM for clear fluid and she was complete and pushing.  Delivery Date/Time: September 2nd, 2025 at 0729 Delivery: Pushed and head delivered ROA. Nuchal cord present x 3- reduced after delivery. Shoulder and body delivered in usual fashion. Infant with spontaneous cry, placed on mother's abdomen, dried and stimulated. Cord clamped x 2 after about 8-minutes delay, and cut by FOB. Cord blood drawn. Placenta delivered spontaneously with gentle cord traction; adherent clot noted over a large portion indicating abuprtion. Fundus firm with massage and Pitocin . Labia, perineum, vagina, and cervix inspected and found to be intact.   Placenta: spont, intact, Cleatus; to path for suspicion of placental abruption Complications: probable placental abruption shortly before delivery Lacerations: none EBL: 365cc Analgesia: epidural  Postpartum Planning [x]  message to sent to schedule follow-up  [x]  mag sulfate x 24h; continue home Norvasc  10mg   Lasix   K+  Infant: boy  APGARs 7/9  2610g (5lb 12.1oz)  Suzen JONETTA Gentry, CNM  03/04/2024 8:24 AM

## 2023-07-04 NOTE — ED Triage Notes (Addendum)
 Here by POV from home for c/o R arm and R leg numbness and tingling for ~ 2 weeks, also intermittent spasms RUE/ RLE. Verbalizes 8 weeks post partum. OB f/u ~ 3 weeks ago. Alert, NAD, calm, interactive. Difficulty sleeping. Denies fever, NVD. Denies back or abd pain.

## 2023-07-04 NOTE — Discharge Instructions (Signed)
 You were seen in the ER today for concerns of numbness. Your labs and imaging were reassuring. Please return to the ER for any worsening symptoms. I have increased the dose of your Amlodipine  from 5mg  to 10mg . Please follow up with your primary care provider for further evaluation as needed.

## 2023-07-08 NOTE — ED Provider Notes (Signed)
 [place] [facility] AT [facility] Provider Note   CSN: [id number] Arrival date & time: 07/04/23  1313     History Chief Complaint  Patient presents with   Numbness    Brandi [name] is a 28 y.o. female with past history significant for anxiety, depression, HTN, migraine headaches, and recent pregnancy/delivery here with concerns of right arm and leg numbness and tingling for 2 weeks. She states this feeling comes and goes and occasional with switch to the left side but primarily in the LLE. She states she is about 8 weeks post-partum and recently started on amlodipine  for management of HTN. Reports that she has also been taking her medications for anxiety, depression, and sleep difficulties which include sertraline , trazodone , and hydroxyzine . No current SI, HI, thoughts of self-harm or other distressing thoughts. Appears patient was recently admitted at [facility] for depression and a suicide attempt on 06/17/2023.  HPI     Home Medications Prior to Admission medications   Medication Sig Start Date End Date Taking? Authorizing Provider  amLODipine  (NORVASC ) 10 MG tablet Take 1 tablet (10 mg total) by mouth daily. 07/04/23  Yes Asiah Befort A, PA-C  Cholecalciferol  (VITAMIN D ) 125 MCG (5000 UT) CAPS Take 1 capsule by mouth daily after breakfast. 06/20/23   Ntuen, Tina C, FNP  hydrOXYzine  (ATARAX ) 25 MG tablet Take 1 tablet (25 mg total) by mouth 3 (three) times daily as needed for anxiety. 06/20/23   Ntuen, Tina C, FNP  sertraline  (ZOLOFT ) 50 MG tablet Take 1 tablet (50 mg total) by mouth daily. 06/21/23   Ntuen, Tina C, FNP  traZODone  (DESYREL ) 50 MG tablet Take 1 tablet (50 mg total) by mouth at bedtime as needed for sleep. 06/20/23   Ntuen, Tina C, FNP      Allergies    Patient has no known allergies.    Review of Systems   Review of Systems  Neurological:  Positive for numbness.  All other systems reviewed and are negative.   Physical Exam Updated  Vital Signs BP (!) 140/80 (BP Location: Right Arm)   Pulse 78   Temp 98.3 F (36.8 C) (Oral)   Resp 19   Ht 5' 2 (1.575 m)   Wt 89.8 kg   LMP 06/24/2023 (Approximate)   SpO2 100%   BMI 36.21 kg/m  Physical Exam Vitals and nursing note reviewed.  Constitutional:      General: She is not in acute distress.    Appearance: Normal appearance. She is normal weight. She is not ill-appearing or diaphoretic.  HENT:     Head: Normocephalic and atraumatic.     Nose: Nose normal.     Mouth/Throat:     Mouth: Mucous membranes are moist.     Pharynx: Oropharynx is clear. No oropharyngeal exudate or posterior oropharyngeal erythema.  Eyes:     General: No scleral icterus.    Conjunctiva/sclera: Conjunctivae normal.     Pupils: Pupils are equal, round, and reactive to light.  Cardiovascular:     Rate and Rhythm: Normal rate and regular rhythm.     Pulses: Normal pulses.     Heart sounds: Normal heart sounds.  Pulmonary:     Effort: Pulmonary effort is normal. No respiratory distress.     Breath sounds: Normal breath sounds. No wheezing, rhonchi or rales.  Chest:     Chest wall: No tenderness.  Abdominal:     General: Abdomen is flat. Bowel sounds are normal.     Palpations: Abdomen  is soft. There is no mass.     Tenderness: There is no guarding or rebound.     Hernia: No hernia is present.  Musculoskeletal:        General: No swelling or tenderness.     Right lower leg: No edema.     Left lower leg: No edema.  Skin:    General: Skin is warm and dry.     Capillary Refill: Capillary refill takes less than 2 seconds.     Findings: No bruising, erythema or rash.  Neurological:     General: No focal deficit present.     Mental Status: She is alert. Mental status is at baseline.     Cranial Nerves: No cranial nerve deficit.     Sensory: No sensory deficit.     Motor: No weakness.     Comments: Currently not demonstrating and weakness or numbness in upper or lower extremities on  either side. RUE=LUE and RLE=LLE regarding strength and sensation.  No facial droop or slurred speech.  Psychiatric:        Mood and Affect: Mood normal.        Behavior: Behavior normal.        Thought Content: Thought content normal.        Judgment: Judgment normal.     ED Results / Procedures / Treatments   Labs (all labs ordered are listed, but only abnormal results are displayed) Labs Reviewed  CBC WITH DIFFERENTIAL/PLATELET  COMPREHENSIVE METABOLIC PANEL    EKG EKG Interpretation Date/Time:  Wednesday July 04 2023 21:10:00 EST Ventricular Rate:  69 PR Interval:  178 QRS Duration:  94 QT Interval:  366 QTC Calculation: 392 R Axis:   67  Text Interpretation: Sinus rhythm Atrial premature complex Nonspecific T abnormalities, anterior leads Confirmed by Cottie Cough [phone]) on 07/04/2023 9:12:05 PM  Radiology No results found.  Procedures Procedures    Medications Ordered in ED Medications - No data to display  ED Course/ Medical Decision Making/ A&P                                 Medical Decision Making Amount and/or Complexity of Data Reviewed Labs: ordered.  Risk Prescription drug management.   This patient presents to the ED for concern of numbness. Differential diagnosis includes stroke, MS, dehydration, panic attack   Lab Tests:  I Ordered, and personally interpreted labs.  The pertinent results include:  CBC and CMP are unremarkable   Imaging Studies ordered:  I ordered imaging studies including CT head  I independently visualized and interpreted imaging which showed no acute intracranial abnormality I agree with the radiologist interpretation   Problem List / ED Course:  Patient with past history significant for anxiety, depression, HTN, migraines, and recent pregnancy/delivery here with concerns of numbness. States that she has been experiencing intermittent episodes of numbness to the right side of her body over the last few  weeks, but currently symptom free. She is currently on amlodipine  for HTN, sertraline  for anxiety, trazodone  for sleep, and hydroxyzine  for breakthrough anxiety. No other recently added medications. On further questioning of timing of symptoms, she reports to me that she has noted some associated with increased episodes of anxiety causing some of these symptoms to start up, but denies any other clear triggers. States she has been somewhat reluctant to take her hydroxyzine  for breakthrough anxiety. Labs and imaging ordered for assessment of any  possible alternative cause but were unremarkable. Given lack of current symptoms and no other risk factors, doubt that this is a stroke. Advised patient to ensure she is taking her home medications consistently and plan on PCP/psychiatry follow up for further assessment of symptoms. Patient was noted to be hypertensive in the ED today but reports she had taken her amlodipine  earlier. Unclear if this is typical blood pressure for patient but she is currently on lower dose at 5mg . No acute indication to treat HTN in the ED as there are no symptoms of HTN emergency. Instead, discussed increasing home dose of amlodipine  from 5mg  to 10mg  and tracking BP in log to discuss with PCP. Patient is outside of pre-eclampsia window as she is currently 8 weeks+ postpartum. Otherwise patient is stable and comfortable with outpatient follow up. Discussed return precautions such as return of unilateral weakness, slurred speech, facial droop, or other neurological deficit. Patient discharged home in stable condition.  Final Clinical Impression(s) / ED Diagnoses Final diagnoses:  Paresthesia  Hypertension, unspecified type    Rx / DC Orders ED Discharge Orders          Ordered    amLODipine  (NORVASC ) 10 MG tablet  Daily        07/04/23 2215              Tisheena Maguire A, PA-C 07/08/23 1437    Cottie Donnice PARAS, MD 07/09/23 1704

## 2023-07-13 ENCOUNTER — Encounter: Payer: Self-pay | Admitting: Nurse Practitioner

## 2023-07-13 ENCOUNTER — Ambulatory Visit: Payer: Medicaid Other | Admitting: Nurse Practitioner

## 2023-07-18 NOTE — Progress Notes (Signed)
 Not seen

## 2023-07-20 ENCOUNTER — Ambulatory Visit: Payer: Medicaid Other | Admitting: Nurse Practitioner

## 2023-07-20 ENCOUNTER — Encounter: Payer: Self-pay | Admitting: Nurse Practitioner

## 2023-07-20 VITALS — BP 132/78 | HR 73 | Wt 192.2 lb

## 2023-07-20 DIAGNOSIS — I10 Essential (primary) hypertension: Secondary | ICD-10-CM | POA: Diagnosis not present

## 2023-07-20 DIAGNOSIS — F5104 Psychophysiologic insomnia: Secondary | ICD-10-CM

## 2023-07-20 DIAGNOSIS — F411 Generalized anxiety disorder: Secondary | ICD-10-CM

## 2023-07-20 DIAGNOSIS — E66812 Obesity, class 2: Secondary | ICD-10-CM

## 2023-07-20 DIAGNOSIS — Z6835 Body mass index (BMI) 35.0-35.9, adult: Secondary | ICD-10-CM

## 2023-07-20 DIAGNOSIS — F332 Major depressive disorder, recurrent severe without psychotic features: Secondary | ICD-10-CM

## 2023-07-20 DIAGNOSIS — E282 Polycystic ovarian syndrome: Secondary | ICD-10-CM

## 2023-07-20 DIAGNOSIS — K439 Ventral hernia without obstruction or gangrene: Secondary | ICD-10-CM

## 2023-07-20 DIAGNOSIS — F5101 Primary insomnia: Secondary | ICD-10-CM

## 2023-07-20 MED ORDER — SERTRALINE HCL 50 MG PO TABS: 75.0000 mg | ORAL_TABLET | Freq: Every day | ORAL | 2 refills | Status: DC

## 2023-07-20 MED ORDER — WEGOVY 0.25 MG/0.5ML ~~LOC~~ SOAJ: 0.2500 mg | SUBCUTANEOUS | 0 refills | Status: DC

## 2023-07-20 MED ORDER — GABAPENTIN 100 MG PO CAPS: 100.0000 mg | ORAL_CAPSULE | Freq: Every evening | ORAL | 3 refills | Status: DC | PRN

## 2023-07-20 MED ORDER — BUSPIRONE HCL 5 MG PO TABS: 5.0000 mg | ORAL_TABLET | Freq: Two times a day (BID) | ORAL | 2 refills | Status: DC | PRN

## 2023-07-20 NOTE — Progress Notes (Signed)
Brandi Clamp, DNP, AGNP-c [facility]  [address] [place], [place] [address] [phone]  ESTABLISHED PATIENT- Chronic Health and/or Follow-Up Visit  Blood pressure 132/78, pulse 73, weight 192 lb 3.2 oz (87.2 kg), last menstrual period 06/24/2023, not currently breastfeeding.    Brandi [name] is a 28 y.o. year old female presenting today for evaluation and management of chronic conditions.   History of Present Illness Brandi [name] presents with concerns about involuntary limb movements, sleep disturbances, and weight gain. The patient reports experiencing a twitching sensation in her legs, which she describes as non-painful but uncomfortable and annoying. This symptom began after the initiation of certain medications and has been severe enough to warrant an emergency room visit. The patient also reports difficulty sleeping, attributing it to a combination of anxiety, insomnia, and the aforementioned limb movements.  The patient's anxiety has been reportedly exacerbated by these symptoms, with the patient noting an increase in panic attacks. She has been managing these symptoms with sertraline and trazodone, prescribed at a behavioral [facility]. However, the patient has stopped taking trazodone due to concerns that it may be contributing to the involuntary limb movements.  The patient also reports struggling with weight gain, which she believes may be related to her diagnosis of PCOS. She expresses concern about the impact of her weight on her blood pressure and overall health. The patient has been trying to lose weight but has found it challenging.  The patient also mentions a hernia, which she is considering surgery for. She expresses concern about the impact of the hernia on her ability to exercise and lose weight. The patient also reports experiencing sciatica during her pregnancies, which has contributed to her discomfort.  All ROS negative with exception of  what is listed above.   PHYSICAL EXAM Physical Exam Vitals and nursing note reviewed.  Constitutional:      Appearance: Normal appearance.  HENT:     Head: Normocephalic.  Eyes:     Pupils: Pupils are equal, round, and reactive to light.  Cardiovascular:     Rate and Rhythm: Normal rate and regular rhythm.     Pulses: Normal pulses.     Heart sounds: Normal heart sounds.  Pulmonary:     Effort: Pulmonary effort is normal.     Breath sounds: Normal breath sounds.  Abdominal:     Hernia: A hernia is present.  Musculoskeletal:        General: Normal range of motion.     Cervical back: Normal range of motion.  Skin:    General: Skin is warm.  Neurological:     General: No focal deficit present.     Mental Status: She is alert and oriented to person, place, and time.  Psychiatric:        Mood and Affect: Mood normal.      PLAN Problem List Items Addressed This Visit     Primary insomnia   Difficulty sleeping, exacerbated by anxiety and environmental factors. Avoiding trazodone due to limb movement concerns. Discussed gabapentin as an alternative for sleep and mood stabilization. Gabapentin can be taken as needed and may improve sleep patterns. - Prescribe gabapentin for sleep as needed - Monitor sleep patterns and reassess if necessary      Hernia of anterior abdominal wall   Post-hernia surgery with current concerns. Midwife recommended core strengthening program. Discussed importance of core exercises to potentially avoid further surgery. Improvement to be reassessed in six months; surgical consultation if no improvement. - Initiate core  strengthening exercises - Reassess in six months to evaluate improvement - Consider surgical consultation if no improvement after six months      Generalized anxiety disorder - Primary   Increased anxiety and panic attacks, potentially exacerbated by life stressors. Currently on sertraline 50 mg with partial improvement. Discussed  increasing sertraline to 75 mg for better control. Hydroxyzine caused excessive drowsiness. Discussed Buspar as a non-sedating alternative for acute anxiety, noting potential grogginess in 40% of cases. - Increase sertraline to 75 mg for one month, then reassess - Prescribe Buspar for acute anxiety management - Continue sertraline at night      Relevant Medications   sertraline (ZOLOFT) 50 MG tablet   busPIRone (BUSPAR) 5 MG tablet   PCOS (polycystic ovarian syndrome)   PCOS with excessive hair growth and difficulty losing weight. Regular menstrual cycles suggest milder form. Discussed weight loss medications, particularly injectables like Wegovy, for appetite reduction and weight management. Insurance coverage varies; prior authorization may be required. - Submit prior authorization for [facility] for weight management - If Reginal Lutes is not covered, consider Mounjaro or Zepbound - Encourage diet and exercise as part of weight management plan      Relevant Medications   Semaglutide-Weight Management (WEGOVY) 0.25 MG/0.5ML SOAJ   Chronic hypertension   BP elevated in the office today. Recommend continue amlodipine for management. Monitor BP at home and if readings are higher than 120/80 on average, report these to me so we can determine if changes need to be made.       Relevant Medications   Semaglutide-Weight Management (WEGOVY) 0.25 MG/0.5ML SOAJ   amLODipine (NORVASC) 10 MG tablet   Class 2 severe obesity with serious comorbidity and body mass index (BMI) of 35.0 to 35.9 in adult [facility])   PCOS with excessive hair growth and difficulty losing weight. Regular menstrual cycles suggest milder form. Discussed weight loss medications, particularly injectables like Wegovy, for appetite reduction and weight management. Insurance coverage varies; prior authorization may be required. - Submit prior authorization for [facility] for weight management - If Reginal Lutes is not covered, consider Mounjaro or  Zepbound - Encourage diet and exercise as part of weight management plan      Relevant Medications   Semaglutide-Weight Management (WEGOVY) 0.25 MG/0.5ML SOAJ   MDD (major depressive disorder), recurrent severe, without psychosis (HCC)   Relevant Medications   sertraline (ZOLOFT) 50 MG tablet   busPIRone (BUSPAR) 5 MG tablet   Other Visit Diagnoses       Psychophysiological insomnia       Relevant Medications   gabapentin (NEURONTIN) 100 MG capsule     Time: 54 minutes, >50% spent counseling, care coordination, chart review, and documentation.    Return in about 3 months (around 10/18/2023) for Med Management 45.  Brandi Clamp, DNP, AGNP-c

## 2023-07-20 NOTE — Patient Instructions (Addendum)
I have increased the sertraline to 75mg  once a day and added buspar 5mg  twice a day as needed for panic symptoms, and gabapentin for anxiety and sleep.   I will see what medications are covered by your insurance and we can certainly get started on that.  I have sent in [facility] to see if it is covered.   WEIGHT LOSS PLANNING Your progress today shows:     07/20/2023   11:12 AM 07/13/2023    9:44 AM 07/04/2023   10:15 PM  Vitals with BMI  Weight 192 lbs 3 oz 194 lbs 10 oz   BMI 35.14 35.58   Systolic 132 134 161  Diastolic 78 86 80  Pulse 73 92 78    For best management of weight, it is vital to balance intake versus output. This means the number of calories burned per day must be less than the calories you take in with food and drink.   I recommend trying to follow a diet with the following: Calories: 1200-1500 calories per day Carbohydrates: 150-180 grams of carbohydrates per day  Why: Gives your body enough "quick fuel" for cells to maintain normal function without sending them into starvation mode.  Protein: At least 90 grams of protein per day- 30 grams with each meal Why: Protein takes longer and uses more energy than carbohydrates to break down for fuel. The carbohydrates in your meals serves as quick energy sources and proteins help use some of that extra quick energy to break down to produce long term energy. This helps you not feel hungry as quickly and protein breakdown burns calories.  Water: Drink AT LEAST 64 ounces of water per day  Why: Water is essential to healthy metabolism. Water helps to fill the stomach and keep you fuller longer. Water is required for healthy digestion and filtering of waste in the body.  Fat: Limit fats in your diet- when choosing fats, choose foods with lower fats content such as lean meats (chicken, fish, [place]).  Why: Increased fat intake leads to storage "for later". Once you burn your carbohydrate energy, your body goes into fat and protein  breakdown mode to help you loose weight.  Cholesterol: Fats and oils that are LIQUID at room temperature are best. Choose vegetable oils (olive oil, avocado oil, nuts). Avoid fats that are SOLID at room temperature (animal fats, processed meats). Healthy fats are often found in whole grains, beans, nuts, seeds, and berries.  Why: Elevated cholesterol levels lead to build up of cholesterol on the inside of your blood vessels. This will eventually cause the blood vessels to become hard and can lead to high blood pressure and damage to your organs. When the blood flow is reduced, but the pressure is high from cholesterol buildup, parts of the cholesterol can break off and form clots that can go to the brain or heart leading to a stroke or heart attack.  Fiber: Increase amount of SOLUBLE the fiber in your diet. This helps to fill you up, lowers cholesterol, and helps with digestion. Some foods high in soluble fiber are oats, peas, beans, apples, carrots, barley, and citrus fruits.   Why: Fiber fills you up, helps remove excess cholesterol, and aids in healthy digestion which are all very important in weight management.   I recommend the following as a minimum activity routine: Purposeful walk or other physical activity at least 20 minutes every single day. This means purposefully taking a walk, jog, bike, swim, treadmill, elliptical, dance, etc.  This activity should be ABOVE your normal daily activities, such as walking at work. Goal exercise should be at least 150 minutes a week- work your way up to this.   Heart Rate: Your maximum exercise heart rate should be 220 - Your Age in Years. When exercising, get your heart rate up, but avoid going over the maximum targeted heart rate.  60-70% of your maximum heart rate is where you tend to burn the most fat. To find this number:  220 - Age In Years= Max HR  Max HR x 0.6 (or 0.7) = Fat Burning HR The Fat Burning HR is your goal heart rate while working out  to burn the most fat.  NEVER exercise to the point your feel lightheaded, weak, nauseated, dizzy. If you experience ANY of these symptoms- STOP exercise! Allow yourself to cool down and your heart rate to come down. Then restart slower next time.  If at ANY TIME you feel chest pain or chest pressure during exercise, STOP IMMEDIATELY and seek medical attention.

## 2023-07-25 ENCOUNTER — Other Ambulatory Visit (HOSPITAL_COMMUNITY): Payer: Self-pay

## 2023-07-25 ENCOUNTER — Telehealth: Payer: Medicaid Other | Admitting: Certified Nurse Midwife

## 2023-07-25 ENCOUNTER — Telehealth: Payer: Self-pay

## 2023-07-25 NOTE — Telephone Encounter (Signed)
Pharmacy Patient Advocate Encounter   Received notification from CoverMyMeds that prior authorization for [facility] is required/requested.   Insurance verification completed.   The patient is insured through [facility] .   Per test claim: PA required; PA submitted to above mentioned insurance via CoverMyMeds Key/confirmation #/EOC (Key: [id number])    Status is pending

## 2023-07-26 ENCOUNTER — Other Ambulatory Visit (HOSPITAL_COMMUNITY): Payer: Self-pay

## 2023-07-26 ENCOUNTER — Encounter: Payer: Self-pay | Admitting: Nurse Practitioner

## 2023-07-26 LAB — PREGNANCY, URINE: Pregnancy Test, Urine: POSITIVE

## 2023-07-26 NOTE — Telephone Encounter (Signed)
Pharmacy Patient Advocate Encounter  Received notification from [facility] that Prior Authorization for [facility] has been APPROVED from 1.22.25 to 7.21.25. Ran test claim, Copay is $0.00. This test claim was processed through [facility]- copay amounts may vary at other pharmacies due to pharmacy/plan contracts, or as the patient moves through the different stages of their insurance plan.

## 2023-07-27 ENCOUNTER — Encounter: Payer: Self-pay | Admitting: Certified Nurse Midwife

## 2023-07-31 ENCOUNTER — Encounter: Payer: Self-pay | Admitting: Nurse Practitioner

## 2023-07-31 DIAGNOSIS — Z6835 Body mass index (BMI) 35.0-35.9, adult: Secondary | ICD-10-CM | POA: Insufficient documentation

## 2023-07-31 DIAGNOSIS — F5104 Psychophysiologic insomnia: Secondary | ICD-10-CM | POA: Insufficient documentation

## 2023-07-31 MED ORDER — AMLODIPINE BESYLATE 10 MG PO TABS: 10.0000 mg | ORAL_TABLET | Freq: Every day | ORAL | 0 refills | Status: DC

## 2023-07-31 NOTE — Assessment & Plan Note (Signed)
Post-hernia surgery with current concerns. Midwife recommended core strengthening program. Discussed importance of core exercises to potentially avoid further surgery. Improvement to be reassessed in six months; surgical consultation if no improvement. - Initiate core strengthening exercises - Reassess in six months to evaluate improvement - Consider surgical consultation if no improvement after six months

## 2023-07-31 NOTE — Assessment & Plan Note (Signed)
BP elevated in the office today. Recommend continue amlodipine for management. Monitor BP at home and if readings are higher than 120/80 on average, report these to me so we can determine if changes need to be made.

## 2023-07-31 NOTE — Assessment & Plan Note (Signed)
PCOS with excessive hair growth and difficulty losing weight. Regular menstrual cycles suggest milder form. Discussed weight loss medications, particularly injectables like Wegovy, for appetite reduction and weight management. Insurance coverage varies; prior authorization may be required. - Submit prior authorization for [facility] for weight management - If Reginal Lutes is not covered, consider Mounjaro or Zepbound - Encourage diet and exercise as part of weight management plan

## 2023-07-31 NOTE — Assessment & Plan Note (Signed)
Difficulty sleeping, exacerbated by anxiety and environmental factors. Avoiding trazodone due to limb movement concerns. Discussed gabapentin as an alternative for sleep and mood stabilization. Gabapentin can be taken as needed and may improve sleep patterns. - Prescribe gabapentin for sleep as needed - Monitor sleep patterns and reassess if necessary

## 2023-07-31 NOTE — Assessment & Plan Note (Signed)
Increased anxiety and panic attacks, potentially exacerbated by life stressors. Currently on sertraline 50 mg with partial improvement. Discussed increasing sertraline to 75 mg for better control. Hydroxyzine caused excessive drowsiness. Discussed Buspar as a non-sedating alternative for acute anxiety, noting potential grogginess in 40% of cases. - Increase sertraline to 75 mg for one month, then reassess - Prescribe Buspar for acute anxiety management - Continue sertraline at night

## 2023-08-09 ENCOUNTER — Other Ambulatory Visit: Payer: Self-pay

## 2023-08-09 DIAGNOSIS — F332 Major depressive disorder, recurrent severe without psychotic features: Secondary | ICD-10-CM

## 2023-08-09 DIAGNOSIS — F411 Generalized anxiety disorder: Secondary | ICD-10-CM

## 2023-08-09 MED ORDER — SERTRALINE HCL 50 MG PO TABS: 75.0000 mg | ORAL_TABLET | Freq: Every day | ORAL | 2 refills | Status: DC

## 2023-08-24 ENCOUNTER — Telehealth: Payer: Self-pay

## 2023-08-24 ENCOUNTER — Encounter: Payer: Self-pay | Admitting: Certified Nurse Midwife

## 2023-08-24 DIAGNOSIS — O219 Vomiting of pregnancy, unspecified: Secondary | ICD-10-CM

## 2023-08-24 DIAGNOSIS — F5101 Primary insomnia: Secondary | ICD-10-CM

## 2023-08-24 MED ORDER — HYDROXYZINE PAMOATE 50 MG PO CAPS: 50.0000 mg | ORAL_CAPSULE | Freq: Three times a day (TID) | ORAL | 0 refills | Status: DC | PRN

## 2023-08-24 MED ORDER — DOXYLAMINE-PYRIDOXINE 10-10 MG PO TBEC: DELAYED_RELEASE_TABLET | ORAL | 1 refills | Status: DC

## 2023-08-24 NOTE — Telephone Encounter (Signed)
Copied from CRM [id number]. Topic: Clinical - Medication Question >> Aug 24, 2023 10:16 AM Carlatta H wrote: Reason for CRM: Patient has some medication questions please call back  Called pt. Back per E2C2 she asked if there was anything you could call her in for sleep and nausea, she was told to stop Wegovy and Gabapentin because she is pregnant. She has not slept in 24 hrs and has a lot of nausea.

## 2023-08-28 ENCOUNTER — Telehealth: Payer: Self-pay | Admitting: Lactation Services

## 2023-08-28 NOTE — Telephone Encounter (Signed)
 Received PA request for Diclegis. Called Pharmacy and has already been approved and patient has picked up. No further action needed at this time.

## 2023-09-04 ENCOUNTER — Telehealth (INDEPENDENT_AMBULATORY_CARE_PROVIDER_SITE_OTHER): Payer: Medicaid Other

## 2023-09-04 DIAGNOSIS — O0990 Supervision of high risk pregnancy, unspecified, unspecified trimester: Secondary | ICD-10-CM | POA: Insufficient documentation

## 2023-09-04 DIAGNOSIS — Z3A10 10 weeks gestation of pregnancy: Secondary | ICD-10-CM

## 2023-09-04 NOTE — Patient Instructions (Signed)

## 2023-09-04 NOTE — Progress Notes (Signed)
 New OB Intake  I connected with Brandi [name]  on 09/04/23 at  2:15 PM EST by MyChart Video Visit and verified that I am speaking with the correct person using two identifiers. Nurse is located at [facility] and pt is located at home.  I discussed the limitations, risks, security and privacy concerns of performing an evaluation and management service by telephone and the availability of in person appointments. I also discussed with the patient that there may be a patient responsible charge related to this service. The patient expressed understanding and agreed to proceed.  I explained I am completing New OB Intake today. We discussed EDD of 03/30/2024, by Last Menstrual Period. Pt is [id number]. I reviewed her allergies, medications and Medical/Surgical/OB history.    Patient Active Problem List   Diagnosis Date Noted   Class 2 severe obesity with serious comorbidity and body mass index (BMI) of 35.0 to 35.9 in adult [facility]) 07/31/2023   MDD (major depressive disorder), recurrent severe, without psychosis (HCC) 06/17/2023   SVD (spontaneous vaginal delivery) 04/27/2023   Complicated grief 04/17/2023   Chronic hypertension 02/06/2023   PCOS (polycystic ovarian syndrome) 09/21/2022   Generalized anxiety disorder 06/02/2022   Migraine 06/02/2022   Primary insomnia 11/15/2021   Hernia of anterior abdominal wall 11/15/2021    Concerns addressed today  Delivery Plans Plans to deliver at [facility]. Discussed the nature of our practice with multiple providers including residents and students. Due to the size of the practice, the delivering provider may not be the same as those providing prenatal care.   Patient is not interested in water birth. Offered upcoming OB visit with CNM to discuss further.  MyChart/Babyscripts MyChart access verified. I explained pt will have some visits in office and some virtually. Babyscripts instructions given and order placed. Patient verifies receipt of  registration text/e-mail. Account successfully created and app downloaded. If patient is a candidate for Optimized scheduling, add to sticky note.   Blood Pressure Cuff/Weight Scale Blood pressure cuff ordered for patient to pick-up from [employer]. Explained after first prenatal appt pt will check weekly and document in Babyscripts.  Anatomy US Explained first scheduled [place] will be around 19 weeks. Anatomy US scheduled for 11/10/2023 at 10:00am.  Is patient a CenteringPregnancy candidate?  Not a candidate due to [facility], medication controlled If accepted,    Is patient a Mom+Baby Combined Care candidate?  Not a candidate   If accepted, confirm patient does not intend to move from the area for at least 12 months, then notify Mom+Baby staff  Interested in [place]? If yes, send referral and doula dot phrase.   Is patient a candidate for Babyscripts Optimization? Yes, patient accepted    First visit review I reviewed new OB appt with patient. Explained pt will be seen by Edd Arbour, CNM at first visit. Discussed Avelina Laine genetic screening with patient. Panorama and Horizon.. Routine prenatal labs is needed at new ob visit.  Last Pap No results found for: "DIAGPAP"  Lowry Bowl, CMA 09/04/2023  2:15 PM

## 2023-09-12 ENCOUNTER — Ambulatory Visit (INDEPENDENT_AMBULATORY_CARE_PROVIDER_SITE_OTHER): Payer: Medicaid Other | Admitting: Certified Nurse Midwife

## 2023-09-12 ENCOUNTER — Other Ambulatory Visit: Payer: Self-pay

## 2023-09-12 ENCOUNTER — Other Ambulatory Visit (HOSPITAL_COMMUNITY)
Admission: RE | Admit: 2023-09-12 | Discharge: 2023-09-12 | Disposition: A | Discharge status: Home / Self Care | Source: Ambulatory Visit | Attending: Certified Nurse Midwife | Admitting: Certified Nurse Midwife

## 2023-09-12 ENCOUNTER — Ambulatory Visit

## 2023-09-12 VITALS — BP 133/85 | HR 73 | Wt 195.4 lb

## 2023-09-12 DIAGNOSIS — I10 Essential (primary) hypertension: Secondary | ICD-10-CM | POA: Diagnosis not present

## 2023-09-12 DIAGNOSIS — Z3A11 11 weeks gestation of pregnancy: Secondary | ICD-10-CM

## 2023-09-12 DIAGNOSIS — O0991 Supervision of high risk pregnancy, unspecified, first trimester: Secondary | ICD-10-CM | POA: Diagnosis present

## 2023-09-12 DIAGNOSIS — Z3491 Encounter for supervision of normal pregnancy, unspecified, first trimester: Secondary | ICD-10-CM | POA: Diagnosis not present

## 2023-09-12 DIAGNOSIS — O219 Vomiting of pregnancy, unspecified: Secondary | ICD-10-CM

## 2023-09-12 DIAGNOSIS — Z1332 Encounter for screening for maternal depression: Secondary | ICD-10-CM | POA: Diagnosis not present

## 2023-09-12 DIAGNOSIS — F411 Generalized anxiety disorder: Secondary | ICD-10-CM

## 2023-09-12 DIAGNOSIS — Z3A10 10 weeks gestation of pregnancy: Secondary | ICD-10-CM

## 2023-09-12 DIAGNOSIS — M5432 Sciatica, left side: Secondary | ICD-10-CM

## 2023-09-12 DIAGNOSIS — Z3687 Encounter for antenatal screening for uncertain dates: Secondary | ICD-10-CM | POA: Diagnosis not present

## 2023-09-12 DIAGNOSIS — F4321 Adjustment disorder with depressed mood: Secondary | ICD-10-CM | POA: Diagnosis not present

## 2023-09-12 DIAGNOSIS — O3680X0 Pregnancy with inconclusive fetal viability, not applicable or unspecified: Secondary | ICD-10-CM | POA: Diagnosis not present

## 2023-09-12 DIAGNOSIS — K439 Ventral hernia without obstruction or gangrene: Secondary | ICD-10-CM

## 2023-09-13 ENCOUNTER — Encounter: Payer: Self-pay | Admitting: Certified Nurse Midwife

## 2023-09-13 LAB — CBC/D/PLT+RPR+RH+ABO+RUBIGG...
Antibody Screen: NEGATIVE
Basophils Absolute: 0 10*3/uL (ref 0.0–0.2)
Basos: 0 %
EOS (ABSOLUTE): 0.1 10*3/uL (ref 0.0–0.4)
Eos: 2 %
HCV Ab: NONREACTIVE
HIV Screen 4th Generation wRfx: NONREACTIVE
Hematocrit: 40.9 % (ref 34.0–46.6)
Hemoglobin: 13.6 g/dL (ref 11.1–15.9)
Hepatitis B Surface Ag: NEGATIVE
Immature Grans (Abs): 0 10*3/uL (ref 0.0–0.1)
Immature Granulocytes: 0 %
Lymphocytes Absolute: 2.7 10*3/uL (ref 0.7–3.1)
Lymphs: 30 %
MCH: 28.2 pg (ref 26.6–33.0)
MCHC: 33.3 g/dL (ref 31.5–35.7)
MCV: 85 fL (ref 79–97)
Monocytes Absolute: 0.5 10*3/uL (ref 0.1–0.9)
Monocytes: 6 %
Neutrophils Absolute: 5.7 10*3/uL (ref 1.4–7.0)
Neutrophils: 62 %
Platelets: 359 10*3/uL (ref 150–450)
RBC: 4.83 x10E6/uL (ref 3.77–5.28)
RDW: 14.5 % (ref 11.7–15.4)
RPR Ser Ql: NONREACTIVE
Rh Factor: POSITIVE
Rubella Antibodies, IGG: 1.38 {index} (ref >0.99)
WBC: 9.2 10*3/uL (ref 3.4–10.8)

## 2023-09-13 LAB — COMPREHENSIVE METABOLIC PANEL
ALT: 13 IU/L (ref 0–32)
AST: 16 IU/L (ref 0–40)
Albumin: 4.4 g/dL (ref 4.0–5.0)
Alkaline Phosphatase: 49 IU/L (ref 44–121)
BUN/Creatinine Ratio: 12 (ref 9–23)
BUN: 9 mg/dL (ref 6–20)
Bilirubin Total: 0.3 mg/dL (ref 0.0–1.2)
CO2: 18 mmol/L — ABNORMAL LOW (ref 20–29)
Calcium: 9.8 mg/dL (ref 8.7–10.2)
Chloride: 100 mmol/L (ref 96–106)
Creatinine, Ser: 0.73 mg/dL (ref 0.57–1.00)
Globulin, Total: 2.8 g/dL (ref 1.5–4.5)
Glucose: 70 mg/dL (ref 70–99)
Potassium: 4.3 mmol/L (ref 3.5–5.2)
Sodium: 136 mmol/L (ref 134–144)
Total Protein: 7.2 g/dL (ref 6.0–8.5)
eGFR: 115 mL/min/{1.73_m2} (ref >59)

## 2023-09-13 LAB — PROTEIN / CREATININE RATIO, URINE
Creatinine, Urine: 102.9 mg/dL
Protein, Ur: 13 mg/dL
Protein/Creat Ratio: 126 mg/g{creat} (ref 0–200)

## 2023-09-13 LAB — HCV INTERPRETATION

## 2023-09-13 LAB — HEMOGLOBIN A1C
Est. average glucose Bld gHb Est-mCnc: 103 mg/dL
Hgb A1c MFr Bld: 5.2 % (ref 4.8–5.6)

## 2023-09-13 MED ORDER — ONDANSETRON 4 MG PO TBDP: 4.0000 mg | ORAL_TABLET | Freq: Three times a day (TID) | ORAL | 2 refills | Status: DC | PRN

## 2023-09-13 NOTE — Progress Notes (Signed)
 History:   Brandi [name] is a 28 y.o. [id number] at [redacted]w[redacted]d by LMP, early ultrasound being seen today for her first obstetrical visit.  Her obstetrical history is significant for  closely spaced pregnancies and chronic hypertension. Her last pregnancy was especially challenging as she had nausea, chronic headaches, sciatic pain and then experienced the sudden loss of her mother causing very complicated grief . Patient does intend to breast feed. She is considering a BTS for birth control. Pregnancy history fully reviewed.  Patient reports  constant low level nausea, no vomiting but is still making her feel pretty miserable. Also already starting to have sciatic pain . Controlling her nausea with diclegis, which is also helping her sleep well.   HISTORY: OB History  Gravida Para Term Preterm AB Living  4 2 2  0 1 2  SAB IAB Ectopic Multiple Live Births  1 0 0 0 2    # Outcome Date GA Lbr Len/2nd Weight Sex Type Anes PTL Lv  4 Current           3 Term 04/27/23 [redacted]w[redacted]d / 00:41 7 lb 7 oz (3.374 kg) M Vag-Spont EPI  LIV     Name: Stephenie Acres Twaddell     Apgar1: 8  Apgar5: 9  2 Term 07/13/21 [redacted]w[redacted]d 19:17 / 00:48 8 lb 5.7 oz (3.79 kg) M Vag-Spont EPI  LIV     Birth Comments: WDL     Name: Blackson,BOY [facility]     Apgar1: 9  Apgar5: 9  1 SAB 08/04/19            Last pap smear was done 12/22/20 and was normal  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   COVID-19 virus infection 07/13/2021   Depression    HSV-1 infection    Obesity affecting pregnancy 03/20/2023   PCOS (polycystic ovarian syndrome) 09/21/2022   Preeclampsia 07/13/2021   PTSD (post-traumatic stress disorder)    Tinea corporis 01/11/2021   UTI (urinary tract infection)    Past Surgical History:  Procedure Laterality Date   HERNIA REPAIR  07/03/2006   HERNIA REPAIR  03/30/2022   Family History  Problem Relation Age of Onset   Heart disease Mother    Stroke Mother     Hypertension Mother    Bipolar disorder Mother    Healthy Father    Anxiety disorder Sister    Depression Sister    Hypertension Sister    Hypertension Maternal Grandmother    Lung cancer Maternal Grandmother    Stroke Maternal Grandfather    Colon cancer Maternal Grandfather    Depression Paternal Grandmother    Hypertension Paternal Grandmother    Social History   Tobacco Use   Smoking status: Former    Current packs/day: 0.50    Types: Cigarettes    Passive exposure: Never   Smokeless tobacco: Never  Vaping Use   Vaping status: Never Used  Substance Use Topics   Alcohol use: Not Currently    Comment: occasional prior to pregnancy   Drug use: Never   No Known Allergies Current Outpatient Medications on File Prior to Visit  Medication Sig Dispense Refill   amLODipine (NORVASC) 10 MG tablet Take 1 tablet (10 mg total) by mouth daily. 90 tablet 0   Doxylamine-Pyridoxine (DICLEGIS) 10-10 MG TBEC Take 2 tabs at bedtime. Can add 1 tab in the morning and 1 tab in the afternoon if needed 100 tablet 1   hydrOXYzine (  VISTARIL) 50 MG capsule Take 1 capsule (50 mg total) by mouth 3 (three) times daily as needed. 30 capsule 0   Prenatal Vit-Fe Fumarate-FA (PRENATAL VITAMIN PO) Take by mouth daily.     sertraline (ZOLOFT) 50 MG tablet Take 1.5 tablets (75 mg total) by mouth at bedtime. 45 tablet 2   busPIRone (BUSPAR) 5 MG tablet Take 1 tablet (5 mg total) by mouth 2 (two) times daily as needed (anxiety and panic). (Patient not taking: Reported on 09/12/2023) 60 tablet 2   Cholecalciferol (VITAMIN D) 125 MCG (5000 UT) CAPS Take 1 capsule by mouth daily after breakfast. (Patient not taking: Reported on 09/12/2023) 30 capsule 11   No current facility-administered medications on file prior to visit.   Review of Systems Pertinent items noted in HPI and remainder of comprehensive ROS otherwise negative.  Physical Exam:   Vitals:   09/12/23 1513  BP: 133/85  Pulse: 73  Weight: 195 lb  6.4 oz (88.6 kg)   Fetal Heart Rate (bpm): 162 on [place]  Constitutional: Well-developed, well-nourished pregnant female in no acute distress.  HEENT: PERRLA Skin: normal color and turgor, no rash Cardiovascular: normal rate & rhythm, warm and well perfused Respiratory: normal effort, no problems with respiration noted GI: Abd soft, non-distended MS: Extremities nontender, no edema, normal ROM Neurologic: Alert and oriented x 4.  GU: no CVA tenderness Pelvic: Exam deferred  Assessment:   Pregnancy: [id number] Patient Active Problem List   Diagnosis Date Noted   Supervision of high-risk pregnancy 09/04/2023   Class 2 severe obesity with serious comorbidity and body mass index (BMI) of 35.0 to 35.9 in adult [facility]) 07/31/2023   MDD (major depressive disorder), recurrent severe, without psychosis (HCC) 06/17/2023   Chronic hypertension 02/06/2023   Generalized anxiety disorder 06/02/2022   Migraine 06/02/2022   Diastasis of rectus abdominis 11/15/2021   Hernia of anterior abdominal wall 11/15/2021    Plan:  1. Supervision of high risk pregnancy in first trimester (Primary) - CBC/D/Plt+RPR+Rh+ABO+RubIgG... - HgB A1c - Culture, OB Urine - Comp Met (CMET) - Protein / creatinine ratio, urine - GC/Chlamydia probe amp ([place])not at [facility] PRENATAL TEST  2. [redacted] weeks gestation of pregnancy - Routine OB care, will offer AFP at next visit  3. Chronic hypertension - Currently stable on amlodipine, wants to stay on this since nifedipine causes headaches and labetalol makes her dizzy. - Ambulatory referral to Cardio Obstetrics - Comp Met (CMET) - Protein / creatinine ratio, urine  4. Complicated grief - Doing much better, getting ready to move (she currently lives across from her mother's house)  5. Generalized anxiety disorder - Stable currently but aggravated by constant nausea, pregnancy ambivalence and sciatic pain  6. Nausea and vomiting during pregnancy - ondansetron  (ZOFRAN-ODT) 4 MG disintegrating tablet; Take 1 tablet (4 mg total) by mouth every 8 (eight) hours as needed for nausea or vomiting.  Dispense: 60 tablet; Refill: 2  7. Sciatic pain, left - Ambulatory referral to Physical Therapy  8. Initial obstetric visit in first trimester - Initial labs drawn. - Continue prenatal vitamins. - Problem list reviewed and updated. - Genetic Screening discussed, First trimester screen, Quad screen, and NIPS: ordered. - Ultrasound discussed; fetal anatomic survey: ordered. - Anticipatory guidance about prenatal visits given including labs, ultrasounds, and testing. - Discussed usage of Babyscripts and virtual visits as additional source of managing and completing prenatal visits in midst of coronavirus and pandemic.   - Encouraged to complete MyChart Registration for  her ability to review results, send requests, and have questions addressed.  - The nature of [facility] Healthcare/Faculty Practice with multiple MDs and Advanced Practice Providers was explained to patient; also emphasized that residents, students are part of our team. Prefers continuous midwifery care. - Routine obstetric precautions reviewed. Encouraged to seek out care at office or emergency room [facility] preferred) for urgent and/or emergent concerns.  Return in about 4 weeks (around 10/10/2023) for IN-PERSON, LOB.    Future Appointments  Date Time Provider Department Center  10/10/2023  2:35 PM Osborne Oman [facility]  10/26/2023 10:45 AM Early, Sung Amabile, NP PFM-PFM [facility]  11/09/2023 10:00 AM [facility] NURSE [facility]  11/09/2023 10:15 AM [facility] PROVIDER 1 [facility]  11/09/2023 10:30 AM [facility] US3 [facility]   Edd Arbour, MSN, CNM, IBCLC Certified Nurse Midwife, [facility]

## 2023-09-14 LAB — GC/CHLAMYDIA PROBE AMP (~~LOC~~) NOT AT ARMC
Chlamydia: NEGATIVE
Comment: NEGATIVE
Comment: NORMAL
Neisseria Gonorrhea: NEGATIVE

## 2023-09-14 LAB — URINE CULTURE, OB REFLEX

## 2023-09-14 LAB — CULTURE, OB URINE

## 2023-09-17 ENCOUNTER — Encounter: Payer: Self-pay | Admitting: *Deleted

## 2023-09-20 LAB — PANORAMA PRENATAL TEST FULL PANEL:PANORAMA TEST PLUS 5 ADDITIONAL MICRODELETIONS: FETAL FRACTION: 6.6

## 2023-09-24 ENCOUNTER — Other Ambulatory Visit: Payer: Self-pay | Admitting: Certified Nurse Midwife

## 2023-10-09 NOTE — Progress Notes (Signed)
 Rescheduled

## 2023-10-10 ENCOUNTER — Ambulatory Visit: Admitting: Certified Nurse Midwife

## 2023-10-10 DIAGNOSIS — F411 Generalized anxiety disorder: Secondary | ICD-10-CM

## 2023-10-10 DIAGNOSIS — I10 Essential (primary) hypertension: Secondary | ICD-10-CM

## 2023-10-10 DIAGNOSIS — K439 Ventral hernia without obstruction or gangrene: Secondary | ICD-10-CM

## 2023-10-10 DIAGNOSIS — G43109 Migraine with aura, not intractable, without status migrainosus: Secondary | ICD-10-CM

## 2023-10-10 DIAGNOSIS — Z3A15 15 weeks gestation of pregnancy: Secondary | ICD-10-CM

## 2023-10-21 ENCOUNTER — Other Ambulatory Visit: Payer: Self-pay | Admitting: Certified Nurse Midwife

## 2023-10-21 DIAGNOSIS — F5101 Primary insomnia: Secondary | ICD-10-CM

## 2023-10-23 NOTE — Progress Notes (Unsigned)
   PRENATAL VISIT NOTE  Subjective:  Brandi [name] is a 28 y.o. [id number] at [redacted]w[redacted]d being seen today for ongoing prenatal care.  She is currently monitored for the following issues for this {Blank single:19197::"high-risk","low-risk"} pregnancy and has Diastasis of rectus abdominis; Hernia of anterior abdominal wall; Generalized anxiety disorder; Migraine; Chronic hypertension; MDD (major depressive disorder), recurrent severe, without psychosis (HCC); Psychophysiological insomnia; BMI 35.0-35.9,adult; and Supervision of high-risk pregnancy on their problem list.  Patient reports {sx:14538}.   .  .   . Denies leaking of fluid.   The following portions of the patient's history were reviewed and updated as appropriate: allergies, current medications, past family history, past medical history, past social history, past surgical history and problem list.   Objective:  There were no vitals filed for this visit.  Fetal Status:           General:  Alert, oriented and cooperative. Patient is in no acute distress.  Skin: Skin is warm and dry. No rash noted.   Cardiovascular: Normal heart rate noted  Respiratory: Normal respiratory effort, no problems with respiration noted  Abdomen: Soft, gravid, appropriate for gestational age.        Pelvic: {Blank single:19197::"Cervical exam performed in the presence of a chaperone","Cervical exam deferred"}        Extremities: Normal range of motion.     Mental Status: Normal mood and affect. Normal behavior. Normal judgment and thought content.   Assessment and Plan:  Pregnancy: [id number] at [redacted]w[redacted]d 1. Supervision of high risk pregnancy in second trimester (Primary) ***  2. [redacted] weeks gestation of pregnancy ***  3. Chronic hypertension ***  4. Migraine with aura and without status migrainosus, not intractable ***  5. Generalized anxiety disorder ***  6. MDD (major depressive disorder), recurrent severe, without psychosis (HCC) ***  {Blank  single:19197::"Term","Preterm"} labor symptoms and general obstetric precautions including but not limited to vaginal bleeding, contractions, leaking of fluid and fetal movement were reviewed in detail with the patient. Please refer to After Visit Summary for other counseling recommendations.   No follow-ups on file.  Future Appointments  Date Time Provider Department Center  10/24/2023  4:15 PM Cleophas Dadds [facility]  11/09/2023 10:00 AM [facility] NURSE [place] [facility]  11/09/2023 10:15 AM [facility] PROVIDER 1 [facility]  11/09/2023 10:30 AM [facility] US3 [facility]  11/09/2023  3:40 PM Tobb, Kardie, DO [facility] None    Derick Fleeting, CNM

## 2023-10-24 ENCOUNTER — Ambulatory Visit: Admitting: Certified Nurse Midwife

## 2023-10-24 ENCOUNTER — Other Ambulatory Visit: Payer: Self-pay

## 2023-10-24 VITALS — BP 138/80 | HR 77 | Wt 203.4 lb

## 2023-10-24 DIAGNOSIS — R102 Pelvic and perineal pain: Secondary | ICD-10-CM

## 2023-10-24 DIAGNOSIS — O0992 Supervision of high risk pregnancy, unspecified, second trimester: Secondary | ICD-10-CM | POA: Diagnosis not present

## 2023-10-24 DIAGNOSIS — G43109 Migraine with aura, not intractable, without status migrainosus: Secondary | ICD-10-CM | POA: Diagnosis not present

## 2023-10-24 DIAGNOSIS — J301 Allergic rhinitis due to pollen: Secondary | ICD-10-CM

## 2023-10-24 DIAGNOSIS — O26892 Other specified pregnancy related conditions, second trimester: Secondary | ICD-10-CM

## 2023-10-24 DIAGNOSIS — Z3A17 17 weeks gestation of pregnancy: Secondary | ICD-10-CM

## 2023-10-24 DIAGNOSIS — F332 Major depressive disorder, recurrent severe without psychotic features: Secondary | ICD-10-CM

## 2023-10-24 DIAGNOSIS — I10 Essential (primary) hypertension: Secondary | ICD-10-CM

## 2023-10-24 DIAGNOSIS — F411 Generalized anxiety disorder: Secondary | ICD-10-CM

## 2023-10-24 MED ORDER — FLUTICASONE PROPIONATE 50 MCG/ACT NA SUSP: 1.0000 | Freq: Every day | NASAL | 2 refills | Status: DC

## 2023-10-24 MED ORDER — CETIRIZINE HCL 10 MG PO TABS: 10.0000 mg | ORAL_TABLET | Freq: Every day | ORAL | 6 refills | Status: DC

## 2023-10-26 ENCOUNTER — Encounter: Payer: Medicaid Other | Admitting: Nurse Practitioner

## 2023-11-09 ENCOUNTER — Other Ambulatory Visit: Payer: Self-pay | Admitting: *Deleted

## 2023-11-09 ENCOUNTER — Ambulatory Visit

## 2023-11-09 ENCOUNTER — Ambulatory Visit (HOSPITAL_BASED_OUTPATIENT_CLINIC_OR_DEPARTMENT_OTHER): Admitting: Maternal & Fetal Medicine

## 2023-11-09 ENCOUNTER — Ambulatory Visit: Attending: Certified Nurse Midwife

## 2023-11-09 ENCOUNTER — Ambulatory Visit: Admitting: Cardiology

## 2023-11-09 VITALS — BP 140/72 | HR 72

## 2023-11-09 VITALS — BP 121/67

## 2023-11-09 DIAGNOSIS — O0992 Supervision of high risk pregnancy, unspecified, second trimester: Secondary | ICD-10-CM

## 2023-11-09 DIAGNOSIS — Z3689 Encounter for other specified antenatal screening: Secondary | ICD-10-CM | POA: Insufficient documentation

## 2023-11-09 DIAGNOSIS — Z3A19 19 weeks gestation of pregnancy: Secondary | ICD-10-CM

## 2023-11-09 DIAGNOSIS — Z362 Encounter for other antenatal screening follow-up: Secondary | ICD-10-CM

## 2023-11-09 DIAGNOSIS — O99212 Obesity complicating pregnancy, second trimester: Secondary | ICD-10-CM

## 2023-11-09 DIAGNOSIS — Z3A10 10 weeks gestation of pregnancy: Secondary | ICD-10-CM

## 2023-11-09 DIAGNOSIS — Z6835 Body mass index (BMI) 35.0-35.9, adult: Secondary | ICD-10-CM

## 2023-11-09 DIAGNOSIS — I10 Essential (primary) hypertension: Secondary | ICD-10-CM

## 2023-11-09 DIAGNOSIS — O10012 Pre-existing essential hypertension complicating pregnancy, second trimester: Secondary | ICD-10-CM | POA: Diagnosis not present

## 2023-11-12 NOTE — Progress Notes (Signed)
 Patient information  Patient Name: Brandi [name]  Patient MRN:   [id number]  Referring practice: MFM Referring Provider: [facility])  MFM CONSULT  Brandi [name] is a 28 y.o. [phone] at [redacted]w[redacted]d here for ultrasound and consultation. Patient Active Problem List   Diagnosis Date Noted   Supervision of high-risk pregnancy 09/04/2023   Psychophysiological insomnia 07/31/2023   BMI 35.0-35.9,adult 07/31/2023   MDD (major depressive disorder), recurrent severe, without psychosis (HCC) 06/17/2023   Chronic hypertension 02/06/2023   Generalized anxiety disorder 06/02/2022   Migraine 06/02/2022   Diastasis of rectus abdominis 11/15/2021   Hernia of anterior abdominal wall 11/15/2021    [name] has a pregnancy with the complications mentioned in the problem list. During today's visit we focused on the following concerns:   RE CHTN: I discussed the effect of high blood pressure on pregnancy.  We discussed the importance of compliance with aspirin  to reduce the risk of preeclampsia.  Today her blood pressure was 140/72 with repeat of 121/67 w/o meds.  She denies any signs preeclampsia.   Sonographic findings Single intrauterine pregnancy at 19w 5d  Fetal cardiac activity:  Observed and appears normal. Presentation: Transverse, head to maternal right. The anatomic structures that were well seen appear normal without evidence of soft markers. Due to poor acoustic windows some structures remain suboptimally visualized. Fetal biometry shows the estimated fetal weight at the 35 percentile.  Amniotic fluid: Within normal limits.  MVP: 5.63 cm. Placenta: Anterior. Adnexa: No abnormality visualized. Cervical length: 4.7 cm.  There are limitations of prenatal ultrasound such as the inability to detect certain abnormalities due to poor visualization. Various factors such as fetal position, gestational age and maternal body habitus may increase the  difficulty in visualizing the fetal anatomy.    Recommendations EDD should be 03/30/2024 based on  C R L 1st  (09/12/23). -Detailed ultrasound was done today without abnormalities. -Baseline preeclampsia labs: CMP, CBC and urine protein/creatinine ratio if not previously completed.  -Aspirin  81 mg for preeclampsia prophylaxis -Serial growth ultrasounds starting around 28 weeks to monitor for fetal growth restriction -Delivery timing pending clinical course but likely around [redacted] weeks gestation -Continue routine prenatal care with referring OB provider  Review of Systems: A review of systems was performed and was negative except per HPI   Vitals and Physical Exam    11/09/2023   11:39 AM 11/09/2023   10:05 AM 10/24/2023    5:07 PM  Vitals with BMI  Weight   203 lbs 6 oz  Systolic 121 140 130  Diastolic 67 72 80  Pulse  72 77    Sitting comfortably on the sonogram table Nonlabored breathing Normal rate and rhythm Abdomen is nontender  Past pregnancies OB History  Gravida Para Term Preterm AB Living  4 2 2  1 2   SAB IAB Ectopic Multiple Live Births  1   0 2    # Outcome Date GA Lbr Len/2nd Weight Sex Type Anes PTL Lv  4 Current           3 Term 04/27/23 [redacted]w[redacted]d / 00:41 7 lb 7 oz (3.374 kg) M Vag-Spont EPI  LIV  2 Term 07/13/21 [redacted]w[redacted]d 19:17 / 00:48 8 lb 5.7 oz (3.79 kg) M Vag-Spont EPI  LIV     Birth Comments: WDL  1 SAB 08/04/19             I spent 30 minutes reviewing the patients chart, including labs and  images as well as counseling the patient about her medical conditions. Greater than 50% of the time was spent in direct face-to-face patient counseling.  Brandi Bowling, DO Maternal fetal medicine, [place]   11/12/2023  11:38 AM

## 2023-11-21 ENCOUNTER — Encounter: Payer: Self-pay | Admitting: Certified Nurse Midwife

## 2023-12-05 ENCOUNTER — Ambulatory Visit: Admitting: Certified Nurse Midwife

## 2023-12-05 ENCOUNTER — Other Ambulatory Visit: Payer: Self-pay

## 2023-12-05 ENCOUNTER — Encounter: Payer: Self-pay | Admitting: Certified Nurse Midwife

## 2023-12-05 VITALS — BP 125/81 | HR 77 | Wt 207.0 lb

## 2023-12-05 DIAGNOSIS — O99342 Other mental disorders complicating pregnancy, second trimester: Secondary | ICD-10-CM | POA: Diagnosis not present

## 2023-12-05 DIAGNOSIS — Z3A23 23 weeks gestation of pregnancy: Secondary | ICD-10-CM | POA: Diagnosis not present

## 2023-12-05 DIAGNOSIS — O10012 Pre-existing essential hypertension complicating pregnancy, second trimester: Secondary | ICD-10-CM | POA: Diagnosis not present

## 2023-12-05 DIAGNOSIS — F411 Generalized anxiety disorder: Secondary | ICD-10-CM | POA: Diagnosis not present

## 2023-12-05 DIAGNOSIS — O0992 Supervision of high risk pregnancy, unspecified, second trimester: Secondary | ICD-10-CM

## 2023-12-05 DIAGNOSIS — O10919 Unspecified pre-existing hypertension complicating pregnancy, unspecified trimester: Secondary | ICD-10-CM

## 2023-12-05 MED ORDER — ASPIRIN 81 MG PO TBEC: 81.0000 mg | DELAYED_RELEASE_TABLET | Freq: Every day | ORAL | 12 refills | Status: DC

## 2023-12-06 MED ORDER — SERTRALINE HCL 100 MG PO TABS: 100.0000 mg | ORAL_TABLET | Freq: Every day | ORAL | 6 refills | Status: DC

## 2023-12-06 NOTE — Progress Notes (Signed)
 PRENATAL VISIT NOTE  Subjective:  Brandi [name] is a 28 y.o. [id number] at [redacted]w[redacted]d being seen today for ongoing prenatal care.  She is currently monitored for the following issues for this high-risk pregnancy and has Diastasis of rectus abdominis; Hernia of anterior abdominal wall; Generalized anxiety disorder; Migraine; Chronic hypertension; MDD (major depressive disorder), recurrent severe, without psychosis (HCC); Psychophysiological insomnia; BMI 35.0-35.9,adult; and Supervision of high-risk pregnancy on their problem list.  Patient reports continued pelvic pain with moving/walking but cannot get to PT appts as they are down to one car, which her husband takes to work (she also does not have childcare if she were to get transpo assistance). Is feeling a lot of fetal movement, starting to adjust to the idea of another child. Notes that she is still feeling very anxious and is willing to discuss going up on her dose of zoloft  (although much of her stress is related to being stuck in the house with 3 kids 7 days per week.  Contractions: Not present. Vag. Bleeding: None.  Movement: Present. Denies leaking of fluid.   The following portions of the patient's history were reviewed and updated as appropriate: allergies, current medications, past family history, past medical history, past social history, past surgical history and problem list.   Objective:    Vitals:   12/05/23 1613  BP: 125/81  Pulse: 77  Weight: 207 lb (93.9 kg)   Fetal Status:  Fetal Heart Rate (bpm): 143   Movement: Present    General: Alert, oriented and cooperative. Patient is in no acute distress.  Skin: Skin is warm and dry. No rash noted.   Cardiovascular: Normal heart rate noted  Respiratory: Normal respiratory effort, no problems with respiration noted  Abdomen: Soft, gravid, appropriate for gestational age.  Pain/Pressure: Present (pain hips, pelvic)     Pelvic: Cervical exam deferred        Extremities: Normal  range of motion.  Edema: None  Mental Status: Normal mood and affect. Normal behavior. Normal judgment and thought content.   Assessment and Plan:  Pregnancy: [id number] at [redacted]w[redacted]d 1. Supervision of high risk pregnancy in second trimester (Primary) - Feeling plenty of fetal movement - aspirin  EC 81 MG tablet; Take 1 tablet (81 mg total) by mouth daily. Swallow whole.  Dispense: 30 tablet; Refill: 12 - AFP, Serum, Open Spina Bifida  2. [redacted] weeks gestation of pregnancy - Routine OB care including anticipatory guidance re GTT at next visit  - Will push next visit to 5wks and adopt Babyscripts timing for visits, she has a BP cuff and Babyscripts account to log pressures  3. Chronic hypertension affecting pregnancy - Stable on 10mg  amlodipine  - aspirin  EC 81 MG tablet; Take 1 tablet (81 mg total) by mouth daily. Swallow whole.  Dispense: 30 tablet; Refill: 12  4. Generalized anxiety disorder - Increased zoloft  to 100mg  nightly, will monitor symptoms. Can still take hydroxyzine  at night for sleep assistance.  Preterm labor symptoms and general obstetric precautions including but not limited to vaginal bleeding, contractions, leaking of fluid and fetal movement were reviewed in detail with the patient. Please refer to After Visit Summary for other counseling recommendations.   Return in about 5 weeks (around 01/09/2024) for IN-PERSON, HOB/GTT.  Future Appointments  Date Time Provider Department Center  01/11/2024  1:00 PM [facility] PROVIDER 1 [facility]  01/11/2024  1:30 PM [facility] US4 [facility]  01/11/2024  3:20 PM Tobb, Kardie, DO CVD-W[facility] None  01/16/2024  8:50 AM W[facility]OCA LAB  [facility]  01/16/2024  9:55 AM Derick Fleeting, CNM [facility]    Derick Fleeting, CNM

## 2023-12-07 LAB — AFP, SERUM, OPEN SPINA BIFIDA
AFP MoM: 1.25
AFP Value: 99.3 ng/mL
Gest. Age on Collection Date: 23.3 wk
Maternal Age At EDD: 28.8 a
OSBR Risk 1 IN: 10000
Test Results:: NEGATIVE
Weight: 207 [lb_av]

## 2023-12-23 ENCOUNTER — Other Ambulatory Visit: Payer: Self-pay | Admitting: Nurse Practitioner

## 2023-12-23 ENCOUNTER — Other Ambulatory Visit: Payer: Self-pay | Admitting: Certified Nurse Midwife

## 2023-12-23 DIAGNOSIS — F411 Generalized anxiety disorder: Secondary | ICD-10-CM

## 2023-12-23 DIAGNOSIS — F5101 Primary insomnia: Secondary | ICD-10-CM

## 2023-12-23 DIAGNOSIS — F332 Major depressive disorder, recurrent severe without psychotic features: Secondary | ICD-10-CM

## 2023-12-25 ENCOUNTER — Encounter: Payer: Self-pay | Admitting: Certified Nurse Midwife

## 2023-12-25 DIAGNOSIS — F411 Generalized anxiety disorder: Secondary | ICD-10-CM

## 2023-12-25 DIAGNOSIS — F5101 Primary insomnia: Secondary | ICD-10-CM

## 2023-12-25 DIAGNOSIS — O10919 Unspecified pre-existing hypertension complicating pregnancy, unspecified trimester: Secondary | ICD-10-CM

## 2023-12-25 DIAGNOSIS — E559 Vitamin D deficiency, unspecified: Secondary | ICD-10-CM

## 2023-12-25 DIAGNOSIS — J301 Allergic rhinitis due to pollen: Secondary | ICD-10-CM

## 2023-12-25 DIAGNOSIS — O0992 Supervision of high risk pregnancy, unspecified, second trimester: Secondary | ICD-10-CM

## 2023-12-26 MED ORDER — ASPIRIN 81 MG PO TBEC: 81.0000 mg | DELAYED_RELEASE_TABLET | Freq: Every day | ORAL | 12 refills | Status: DC

## 2023-12-26 MED ORDER — SERTRALINE HCL 100 MG PO TABS: 100.0000 mg | ORAL_TABLET | Freq: Every day | ORAL | 6 refills | Status: DC

## 2023-12-26 MED ORDER — CVS D3 125 MCG (5000 UT) PO CAPS: 5000.0000 [IU] | ORAL_CAPSULE | Freq: Every day | ORAL | 1 refills | Status: DC

## 2023-12-26 MED ORDER — AMLODIPINE BESYLATE 10 MG PO TABS: 10.0000 mg | ORAL_TABLET | Freq: Every day | ORAL | 0 refills | Status: DC

## 2023-12-26 MED ORDER — HYDROXYZINE PAMOATE 50 MG PO CAPS
50.0000 mg | ORAL_CAPSULE | Freq: Every day | ORAL | 6 refills | Status: AC
Start: 2023-12-26 — End: ?

## 2023-12-26 MED ORDER — CETIRIZINE HCL 10 MG PO TABS: 10.0000 mg | ORAL_TABLET | Freq: Every day | ORAL | 6 refills | Status: AC

## 2024-01-11 ENCOUNTER — Encounter: Payer: Self-pay | Admitting: Cardiology

## 2024-01-11 ENCOUNTER — Ambulatory Visit (HOSPITAL_BASED_OUTPATIENT_CLINIC_OR_DEPARTMENT_OTHER): Admitting: Obstetrics

## 2024-01-11 ENCOUNTER — Ambulatory Visit: Attending: Maternal & Fetal Medicine

## 2024-01-11 ENCOUNTER — Other Ambulatory Visit: Payer: Self-pay | Admitting: *Deleted

## 2024-01-11 ENCOUNTER — Ambulatory Visit (INDEPENDENT_AMBULATORY_CARE_PROVIDER_SITE_OTHER): Admitting: Cardiology

## 2024-01-11 VITALS — BP 128/71 | HR 83

## 2024-01-11 VITALS — BP 122/80 | HR 83 | Ht 62.0 in | Wt 212.6 lb

## 2024-01-11 DIAGNOSIS — O10913 Unspecified pre-existing hypertension complicating pregnancy, third trimester: Secondary | ICD-10-CM

## 2024-01-11 DIAGNOSIS — Z3A28 28 weeks gestation of pregnancy: Secondary | ICD-10-CM

## 2024-01-11 DIAGNOSIS — O10013 Pre-existing essential hypertension complicating pregnancy, third trimester: Secondary | ICD-10-CM

## 2024-01-11 DIAGNOSIS — Z3A29 29 weeks gestation of pregnancy: Secondary | ICD-10-CM

## 2024-01-11 DIAGNOSIS — O0993 Supervision of high risk pregnancy, unspecified, third trimester: Secondary | ICD-10-CM | POA: Insufficient documentation

## 2024-01-11 DIAGNOSIS — Z6835 Body mass index (BMI) 35.0-35.9, adult: Secondary | ICD-10-CM

## 2024-01-11 DIAGNOSIS — I10 Essential (primary) hypertension: Secondary | ICD-10-CM

## 2024-01-11 DIAGNOSIS — O99213 Obesity complicating pregnancy, third trimester: Secondary | ICD-10-CM

## 2024-01-11 DIAGNOSIS — E669 Obesity, unspecified: Secondary | ICD-10-CM | POA: Diagnosis not present

## 2024-01-11 DIAGNOSIS — Z362 Encounter for other antenatal screening follow-up: Secondary | ICD-10-CM | POA: Insufficient documentation

## 2024-01-11 NOTE — Patient Instructions (Signed)
 Medication Instructions:  Your physician recommends that you continue on your current medications as directed. Please refer to the Current Medication list given to you today.  *If you need a refill on your cardiac medications before your next appointment, please call your pharmacy*  Follow-Up: At [facility], you and your health needs are our priority.  As part of our continuing mission to provide you with exceptional heart care, our providers are all part of one team.  This team includes your primary Cardiologist (physician) and Advanced Practice Providers or APPs (Physician Assistants and Nurse Practitioners) who all work together to provide you with the care you need, when you need it.  Your next appointment:   8 week(s) [place]  Provider:   Kardie Tobb, DO    We recommend signing up for the patient portal called MyChart.  Sign up information is provided on this After Visit Summary.  MyChart is used to connect with patients for Virtual Visits (Telemedicine).  Patients are able to view lab/test results, encounter notes, upcoming appointments, etc.  Non-urgent messages can be sent to your provider as well.   To learn more about what you can do with MyChart, go to [email or web address].   Other Instructions: Please take your blood pressure daily for 2 weeks and send in a MyChart message. Please include heart rates. (One message at the end of the 2 weeks).   HOW TO TAKE YOUR BLOOD PRESSURE: Rest 5 minutes before taking your blood pressure. Don't smoke or drink caffeinated beverages for at least 30 minutes before. Take your blood pressure before (not after) you eat. Sit comfortably with your back supported and both feet on the floor (don't cross your legs). Elevate your arm to heart level on a table or a desk. Use the proper sized cuff. It should fit smoothly and snugly around your bare upper arm. There should be enough room to slip a fingertip under the cuff. The bottom edge of  the cuff should be 1 inch above the crease of the elbow. Ideally, take 3 measurements at one sitting and record the average.

## 2024-01-13 NOTE — Progress Notes (Signed)
 MFM Consult Note  Brandi [name] is currently at 28 weeks and 5 days.  She has been followed due to maternal obesity with a BMI of 37 and chronic hypertension treated with Norvasc .  She denies any problems since her last exam.    Her blood pressure today was 128/71.    On today's exam, the overall EFW of 2 pounds 13 ounces measures at the 34th percentile for her gestational age.    There was normal amniotic fluid noted with a total AFI of 18.88 cm.  Due to chronic hypertension, we will start weekly fetal testing at 32 weeks.    She should continue taking a daily baby aspirin  for preeclampsia prophylaxis.  Delivery should be considered at between 37 to 39 weeks depending on how well her blood pressures are under control.    A follow-up growth scan and BPP was scheduled in 4 weeks.    The patient stated that all of her questions were answered today.  A total of 20 minutes was spent counseling and coordinating the care for this patient.  Greater than 50% of the time was spent in direct face-to-face contact.

## 2024-01-13 NOTE — Progress Notes (Signed)
 [facility]  New Evaluation  Date:  01/13/2024   ID:  Brandi [name], DOB July 08, 1995, MRN [id number]  PCP:  Oris Camie BRAVO, NP   [facility] Providers Cardiologist:  Dub Huntsman, DO  Electrophysiologist:  None       Referring MD: Vannie Cornell SAUNDERS, CNM   Chief Complaint:   History of Present Illness:    Brandi [name] is a 28 y.o. female [G4P2012] who is being seen today for the evaluation of hypertensive disorder in pregnancy at the request of Vannie Cornell SAUNDERS, CNM.   Medical history of hypertension during pregnancy who presents for management of elevated blood pressure during pregnancy.  She is  currently pregnant and experiences elevated blood pressure with each pregnancy. She did not have hypertension before her first pregnancy but developed it with all subsequent pregnancies. She has a history of pre-eclampsia.  She is on amlodipine , which effectively maintains her blood pressure below 130/90 mmHg. Previous medications caused headaches, but amlodipine  is well-tolerated.  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   cetirizine  (ZYRTEC ) 10 MG tablet Take 1 tablet (10 mg total) by mouth daily.   hydrOXYzine  (VISTARIL ) 50 MG capsule Take 1 capsule (50 mg total) by mouth at bedtime.    Prenatal Vit-Fe Fumarate-FA (PRENATAL VITAMIN PO) Take by mouth daily.   sertraline  (ZOLOFT ) 100 MG tablet Take 1 tablet (100 mg total) by mouth at bedtime.     Allergies:   Patient has no known allergies.   Social History   Socioeconomic History   Marital status: Married    Spouse name: Samir   Number of children: 1   Years of education: Not on file   Highest education level: Associate degree: academic program  Occupational History   Occupation: unemployed  Tobacco Use   Smoking status: Former    Current packs/day: 0.50    Types: Cigarettes    Passive exposure: Never   Smokeless tobacco: Never  Vaping Use   Vaping status: Never Used  Substance and Sexual Activity   Alcohol use: Not Currently    Comment: occasional prior to pregnancy   Drug use: Never   Sexual activity: Not Currently    Birth control/protection: None  Other Topics Concern   Not on file  Social History Narrative   Not on file   Social Drivers of Health   Financial Resource Strain: Not on file  Food Insecurity: No Food Insecurity (09/12/2023)   Hunger Vital Sign    Worried About Running Out of Food in the Last Year: Never true    Ran Out of Food in the Last Year: Never true  Transportation Needs: No Transportation Needs (09/12/2023)   PRAPARE - Transportation    Lack of Transportation (Medical): No    Lack  of Transportation (Non-Medical): No  Physical Activity: Not on file  Stress: Not on file  Social Connections: Not on file      Family History  Problem Relation Age of Onset   Heart disease Mother    Stroke Mother    Hypertension Mother    Bipolar disorder Mother    Healthy Father    Anxiety disorder Sister    Depression Sister    Hypertension Sister    Hypertension Maternal Grandmother    Lung cancer Maternal Grandmother    Stroke Maternal Grandfather    Colon cancer Maternal Grandfather    Depression Paternal Grandmother    Hypertension Paternal Grandmother       ROS:   Please  see the history of present illness.     All other systems reviewed and are negative.   Labs/EKG Reviewed:    EKG:  None today-previous EKG reviewed   Recent Labs: 06/17/2023: TSH 0.783 09/12/2023: ALT 13; BUN 9; Creatinine, Ser 0.73; Hemoglobin 13.6; Platelets 359; Potassium 4.3; Sodium 136   Recent Lipid Panel Lab Results  Component Value Date/Time   CHOL 298 (H) 06/17/2023 02:45 PM   TRIG 76 06/17/2023 02:45 PM   HDL 86 06/17/2023 02:45 PM   CHOLHDL 3.5 06/17/2023 02:45 PM   LDLCALC 197 (H) 06/17/2023 02:45 PM    Physical Exam:    VS:  BP 122/80 (BP Location: Right Arm, Patient Position: Sitting, Cuff Size: Normal)   Pulse 83   Ht 5' 2 (1.575 m)   Wt 212 lb 9.6 oz (96.4 kg)   LMP 06/24/2023 (Approximate)   SpO2 99%   BMI 38.89 kg/m     Wt Readings from Last 3 Encounters:  01/11/24 212 lb 9.6 oz (96.4 kg)  12/05/23 207 lb (93.9 kg)  10/24/23 203 lb 6.4 oz (92.3 kg)     GEN:  Well nourished, well developed in no acute distress HEENT: Normal NECK: No JVD; No carotid bruits LYMPHATICS: No lymphadenopathy CARDIAC: RRR, no murmurs, rubs, gallops RESPIRATORY:  Clear to auscultation without rales, wheezing or rhonchi  ABDOMEN: Soft, non-tender, non-distended MUSCULOSKELETAL:  No edema; No deformity  SKIN: Warm and dry NEUROLOGIC:  Alert and oriented x 3 PSYCHIATRIC:  Normal affect    Risk Assessment/Risk Calculators:          ASSESSMENT & PLAN:    Hypertension disorder in pregnancy - controlled with amlodipine , BP <130/90 mmHg.  Informed about aspirin  and BP monitoring.  - Continue amlodipine . - continue aspirin  81 mg daily for preeclampsia prophylaxis - Monitor BP daily, report if >140/90 mmHg. - Follow-up in 8 weeks at [facility] office.  - Pt meets eligibility for IMPACT study, discussed risks/benefits of the study  - Provided with information sheet  - Aware that the research team will be in contact within 24-48hrs, followed by the Lead CHW     Patient Instructions  Medication Instructions:  Your physician recommends that you continue on your current medications as directed. Please refer to the Current Medication list given to you today.  *If you need a refill on your cardiac medications before your next appointment, please call your pharmacy*  Follow-Up: At [facility], you and your health needs are our priority.  As part of our continuing mission to provide you with exceptional heart care, our providers are all part of one team.  This team includes your primary Cardiologist (physician) and Advanced Practice Providers or APPs (Physician Assistants and Nurse Practitioners) who all work together to provide you  with the care you need, when you need it.  Your next appointment:   8 week(s) [facility]  Provider:   Corayma Cashatt, DO    We recommend signing up for the patient portal called MyChart.  Sign up information is provided on this After Visit Summary.  MyChart is used to connect with patients for Virtual Visits (Telemedicine).  Patients are able to view lab/test results, encounter notes, upcoming appointments, etc.  Non-urgent messages can be sent to your provider as well.   To learn more about what you can do with MyChart, go to [email or web address].   Other Instructions: Please take your blood pressure daily for 2 weeks and send in a MyChart message. Please include heart rates. (One message at the end of the 2 weeks).   HOW TO TAKE YOUR BLOOD PRESSURE: Rest 5 minutes before taking your blood pressure. Don't smoke or drink caffeinated beverages for at least 30 minutes before. Take your blood pressure before (not after) you eat. Sit comfortably with your back supported and both feet on the floor (don't cross your legs). Elevate your arm to heart level on a table or a desk. Use the proper sized cuff. It should fit smoothly and snugly around your bare upper arm. There should be enough room to slip a fingertip under  the cuff. The bottom edge of the cuff should be 1 inch above the crease of the elbow. Ideally, take 3 measurements at one sitting and record the average.   Dispo:  No follow-ups on file.   Medication Adjustments/Labs and Tests Ordered: Current medicines are reviewed at length with the patient today.  Concerns regarding medicines are outlined above.  Tests Ordered: No orders of the defined types were placed in this encounter.  Medication Changes: No orders of the defined types were placed in this encounter.

## 2024-01-14 ENCOUNTER — Telehealth: Payer: Self-pay

## 2024-01-14 ENCOUNTER — Other Ambulatory Visit: Payer: Self-pay

## 2024-01-14 DIAGNOSIS — Z006 Encounter for examination for normal comparison and control in clinical research program: Secondary | ICD-10-CM

## 2024-01-14 DIAGNOSIS — O0993 Supervision of high risk pregnancy, unspecified, third trimester: Secondary | ICD-10-CM

## 2024-01-14 NOTE — Telephone Encounter (Signed)
 Called to speak with pt regarding Impact Mom Study. Pt did not answer. I left a detailed message and my contact information.

## 2024-01-16 ENCOUNTER — Ambulatory Visit (INDEPENDENT_AMBULATORY_CARE_PROVIDER_SITE_OTHER): Payer: Self-pay | Admitting: Certified Nurse Midwife

## 2024-01-16 ENCOUNTER — Other Ambulatory Visit: Payer: Self-pay

## 2024-01-16 VITALS — BP 128/82 | HR 80 | Wt 214.0 lb

## 2024-01-16 DIAGNOSIS — O99613 Diseases of the digestive system complicating pregnancy, third trimester: Secondary | ICD-10-CM

## 2024-01-16 DIAGNOSIS — O0993 Supervision of high risk pregnancy, unspecified, third trimester: Secondary | ICD-10-CM

## 2024-01-16 DIAGNOSIS — Z3A29 29 weeks gestation of pregnancy: Secondary | ICD-10-CM

## 2024-01-16 DIAGNOSIS — K439 Ventral hernia without obstruction or gangrene: Secondary | ICD-10-CM

## 2024-01-16 NOTE — Progress Notes (Unsigned)
   PRENATAL VISIT NOTE  Subjective:  Brandi [name] is a 28 y.o. H5E7987 at [redacted]w[redacted]d being seen today for ongoing prenatal care.  She is currently monitored for the following issues for this {Blank single:19197::high-risk,low-risk} pregnancy and has Diastasis of rectus abdominis; Hernia of anterior abdominal wall; Generalized anxiety disorder; Migraine; Chronic hypertension; MDD (major depressive disorder), recurrent severe, without psychosis (HCC); Psychophysiological insomnia; BMI 35.0-35.9,adult; and Supervision of high-risk pregnancy on their problem list.  Patient reports {sx:14538}.  Contractions: Not present. Vag. Bleeding: None.  Movement: Present. Denies leaking of fluid.   The following portions of the patient's history were reviewed and updated as appropriate: allergies, current medications, past family history, past medical history, past social history, past surgical history and problem list.   Objective:    Vitals:   01/16/24 0942  BP: 128/82  Pulse: 80  Weight: 214 lb (97.1 kg)    Fetal Status:  Fetal Heart Rate (bpm): 134 Fundal Height: 29 cm Movement: Present Presentation: Vertex  General: Alert, oriented and cooperative. Patient is in no acute distress.  Skin: Skin is warm and dry. No rash noted.   Cardiovascular: Normal heart rate noted  Respiratory: Normal respiratory effort, no problems with respiration noted  Abdomen: Soft, gravid, appropriate for gestational age.  Pain/Pressure: Absent     Pelvic: {Blank single:19197::Cervical exam performed in the presence of a chaperone,Cervical exam deferred}        Extremities: Normal range of motion.  Edema: None  Mental Status: Normal mood and affect. Normal behavior. Normal judgment and thought content.   Assessment and Plan:  Pregnancy: H5E7987 at [redacted]w[redacted]d 1. Supervision of high risk pregnancy in third trimester (Primary) ***  2. [redacted] weeks gestation of pregnancy ***  3. Hernia of anterior abdominal  wall *** - Ambulatory referral to General Surgery  {Blank single:19197::Term,Preterm} labor symptoms and general obstetric precautions including but not limited to vaginal bleeding, contractions, leaking of fluid and fetal movement were reviewed in detail with the patient. Please refer to After Visit Summary for other counseling recommendations.   Return in about 4 weeks (around 02/13/2024) for IN-PERSON, HOB.  Future Appointments  Date Time Provider Department Center  02/08/2024  2:00 PM [facility] PROVIDER 1 [facility]  02/08/2024  2:30 PM [facility] US5 [facility]  02/22/2024  2:00 PM [facility] PROVIDER 1 [facility]  02/22/2024  2:30 PM [facility] US5 [facility]  02/29/2024  1:00 PM [facility] PROVIDER 1 [facility]  02/29/2024  1:30 PM [facility] US4 [facility]  03/07/2024 10:20 AM Tobb, Kardie, DO [facility]    Cornell JONELLE Finder, CNM

## 2024-01-17 LAB — CBC
Hematocrit: 41.4 % (ref 34.0–46.6)
Hemoglobin: 13.8 g/dL (ref 11.1–15.9)
MCH: 28.8 pg (ref 26.6–33.0)
MCHC: 33.3 g/dL (ref 31.5–35.7)
MCV: 86 fL (ref 79–97)
Platelets: 263 x10E3/uL (ref 150–450)
RBC: 4.8 x10E6/uL (ref 3.77–5.28)
RDW: 13.1 % (ref 11.7–15.4)
WBC: 8.6 x10E3/uL (ref 3.4–10.8)

## 2024-01-17 LAB — GLUCOSE TOLERANCE, 2 HOURS W/ 1HR
Glucose, 1 hour: 125 mg/dL (ref 70–179)
Glucose, 2 hour: 117 mg/dL (ref 70–152)
Glucose, Fasting: 64 mg/dL — ABNORMAL LOW (ref 70–91)

## 2024-01-17 LAB — HIV ANTIBODY (ROUTINE TESTING W REFLEX): HIV Screen 4th Generation wRfx: NONREACTIVE

## 2024-01-17 LAB — RPR: RPR Ser Ql: NONREACTIVE

## 2024-01-18 ENCOUNTER — Telehealth: Payer: Self-pay

## 2024-01-18 DIAGNOSIS — Z006 Encounter for examination for normal comparison and control in clinical research program: Secondary | ICD-10-CM

## 2024-01-18 NOTE — Telephone Encounter (Signed)
 Called to speak with pt regarding Impact Mom Study. Pt did not answer. I left a detailed message and my contact information.

## 2024-01-30 ENCOUNTER — Encounter: Admitting: Certified Nurse Midwife

## 2024-02-06 ENCOUNTER — Encounter: Payer: Self-pay | Admitting: Certified Nurse Midwife

## 2024-02-08 ENCOUNTER — Ambulatory Visit: Attending: Obstetrics | Admitting: Obstetrics

## 2024-02-08 ENCOUNTER — Ambulatory Visit

## 2024-02-08 VITALS — BP 128/67

## 2024-02-08 DIAGNOSIS — Z3A32 32 weeks gestation of pregnancy: Secondary | ICD-10-CM

## 2024-02-08 DIAGNOSIS — E669 Obesity, unspecified: Secondary | ICD-10-CM

## 2024-02-08 DIAGNOSIS — O99213 Obesity complicating pregnancy, third trimester: Secondary | ICD-10-CM | POA: Diagnosis present

## 2024-02-08 DIAGNOSIS — O10913 Unspecified pre-existing hypertension complicating pregnancy, third trimester: Secondary | ICD-10-CM | POA: Diagnosis present

## 2024-02-08 DIAGNOSIS — Z79899 Other long term (current) drug therapy: Secondary | ICD-10-CM | POA: Insufficient documentation

## 2024-02-08 DIAGNOSIS — O10013 Pre-existing essential hypertension complicating pregnancy, third trimester: Secondary | ICD-10-CM

## 2024-02-08 DIAGNOSIS — O09893 Supervision of other high risk pregnancies, third trimester: Secondary | ICD-10-CM | POA: Insufficient documentation

## 2024-02-08 DIAGNOSIS — O0993 Supervision of high risk pregnancy, unspecified, third trimester: Secondary | ICD-10-CM

## 2024-02-08 NOTE — Progress Notes (Signed)
 MFM Consult Note  Brandi [name] is currently at 32 weeks and 5 days.  She has been followed due to maternal obesity with a BMI 35.7 and chronic hypertension treated with amlodipine .    She denies any problems since her last exam.  Her blood pressure today was 128/67.  On today's exam, the overall EFW 4 pounds 10 ounces measures at the 48th percentile for her gestational age.    There was normal amniotic fluid noted with a total AFI of 18.24 cm.    A BPP performed today was 8 out of 8.  Due to chronic hypertension treated with amlodipine , weekly fetal testing until delivery is recommended.    The patient reports that she [place] in very [place] away and may not be able to come in for weekly fetal testing.    She was advised to have her weekly BPP's or  NSTs coordinated with her weekly prenatal visits.    She will return 2 weeks for a BPP.    The patient stated that all of her questions were answered today.  A total of 20 minutes was spent counseling and coordinating the care for this patient.  Greater than 50% of the time was spent in direct face-to-face contact.

## 2024-02-13 ENCOUNTER — Encounter: Admitting: Certified Nurse Midwife

## 2024-02-15 ENCOUNTER — Encounter: Admitting: Family Medicine

## 2024-02-22 ENCOUNTER — Ambulatory Visit: Attending: Obstetrics | Admitting: Obstetrics and Gynecology

## 2024-02-22 ENCOUNTER — Ambulatory Visit (HOSPITAL_BASED_OUTPATIENT_CLINIC_OR_DEPARTMENT_OTHER)

## 2024-02-22 ENCOUNTER — Other Ambulatory Visit

## 2024-02-22 VITALS — BP 133/82 | HR 82

## 2024-02-22 DIAGNOSIS — O10013 Pre-existing essential hypertension complicating pregnancy, third trimester: Secondary | ICD-10-CM | POA: Diagnosis not present

## 2024-02-22 DIAGNOSIS — Z3A34 34 weeks gestation of pregnancy: Secondary | ICD-10-CM

## 2024-02-22 DIAGNOSIS — I10 Essential (primary) hypertension: Secondary | ICD-10-CM

## 2024-02-22 DIAGNOSIS — O99213 Obesity complicating pregnancy, third trimester: Secondary | ICD-10-CM | POA: Diagnosis not present

## 2024-02-22 DIAGNOSIS — O10913 Unspecified pre-existing hypertension complicating pregnancy, third trimester: Secondary | ICD-10-CM | POA: Insufficient documentation

## 2024-02-22 DIAGNOSIS — E669 Obesity, unspecified: Secondary | ICD-10-CM | POA: Diagnosis not present

## 2024-02-22 DIAGNOSIS — O0993 Supervision of high risk pregnancy, unspecified, third trimester: Secondary | ICD-10-CM

## 2024-02-22 DIAGNOSIS — Z6835 Body mass index (BMI) 35.0-35.9, adult: Secondary | ICD-10-CM

## 2024-02-22 NOTE — Progress Notes (Signed)
 After review, MFM consult with provider is not indicated for today  Brandi Ranks, MD 02/22/2024 4:03 PM  [facility]

## 2024-02-29 ENCOUNTER — Ambulatory Visit: Attending: Obstetrics | Admitting: Obstetrics

## 2024-02-29 ENCOUNTER — Ambulatory Visit

## 2024-02-29 ENCOUNTER — Other Ambulatory Visit: Payer: Self-pay | Admitting: *Deleted

## 2024-02-29 VITALS — BP 130/85

## 2024-02-29 VITALS — BP 141/85 | HR 81

## 2024-02-29 DIAGNOSIS — O10913 Unspecified pre-existing hypertension complicating pregnancy, third trimester: Secondary | ICD-10-CM

## 2024-02-29 DIAGNOSIS — Z79899 Other long term (current) drug therapy: Secondary | ICD-10-CM | POA: Diagnosis not present

## 2024-02-29 DIAGNOSIS — Z3A35 35 weeks gestation of pregnancy: Secondary | ICD-10-CM | POA: Diagnosis not present

## 2024-02-29 DIAGNOSIS — O10013 Pre-existing essential hypertension complicating pregnancy, third trimester: Secondary | ICD-10-CM | POA: Diagnosis present

## 2024-02-29 DIAGNOSIS — O0993 Supervision of high risk pregnancy, unspecified, third trimester: Secondary | ICD-10-CM

## 2024-02-29 DIAGNOSIS — O99213 Obesity complicating pregnancy, third trimester: Secondary | ICD-10-CM

## 2024-02-29 DIAGNOSIS — E669 Obesity, unspecified: Secondary | ICD-10-CM

## 2024-02-29 NOTE — Progress Notes (Signed)
 MFM Consult Note  Brandi [name] is currently at 35 weeks and 5 days.  She has been followed due to maternal obesity with a BMI 35.7 and chronic hypertension treated with amlodipine .    The patient reports that she experienced frequent contractions yesterday which have resolved.  Her blood pressures today were 141/85 and 130/85.  There was normal amniotic fluid noted with a total AFI of 15.94 cm.    A BPP performed today was 8 out of 8.  Preeclampsia precautions were reviewed today.    The patient was advised to continue to monitor her blood pressures at home and to go into the hospital should her blood pressures be persistently in the 150s to 160s over 100s range at home.    She was also advised to go to the hospital should she note any changes in her symptoms.  A follow-up growth scan and BPP was scheduled in 1 week.  The patient stated that all of her questions were answered today.  A total of 20 minutes was spent counseling and coordinating the care for this patient.  Greater than 50% of the time was spent in direct face-to-face contact.

## 2024-03-01 NOTE — ED Provider Notes (Addendum)
 [facility] Emergency Department Provider Note  Time Seen     Event Date/Time   First Provider Evaluation 03/01/24 2156         Chief Complaint  Patient presents with  . Pregnancy Problem    Pt is [redacted] weeks pregnant and is c/o right sided abdominal pain. Pt's mother states she doesn't feel the fetus moving since this afternoon. Pt c/o 8/10 abd pain. Pt states she has contractions every 5 mins. FHR 160 in triage.     HPI History of Present Illness Brandi [name] is a 28 y.o. female who presents due to abdominal pain.  Patient is G3, P2, approximately [redacted] weeks pregnant.  Recently moved from [place].  She states she has had gestational hypertension and takes amlodipine  daily.  She started having spots and flashes in her vision yesterday.  She was having some pain in the right upper quadrant of her abdomen.  She was seen by OB/GYN.  She was cleared at that time but they told her to come in if her symptoms worsen.  She states throughout the day today she has been extremely fatigued.  She has had decreased fetal movements.  She states that prior to arrival she felt like she was having contractions about every 5 minutes.  She was having intermittent tightening's at that point she decided to come in.  She has not established with an OB/GYN in this area because she did not have any transfer paperwork completed yet.  She has not had any gush of fluid or bleeding.  She does feel pressure in her pelvic region.  Patient states that the spots and flashes in her vision have continued today.  Denies leg swelling.  She states that she did have gestational hypertension with her previous pregnancies.  She also at 1 point was told she had preeclampsia but she does not remember if she was ever placed on magnesium  drips.  She states she did have to get induced because of the blood pressure ago.   Review of Systems Except as noted in the above Review of Systems and in the History of Present  Illness, other systems have been reviewed and are negative or noncontributory   Past History  Medical History[1]   Surgical History[2]  Family History[3]   Social History Social History[4]   Allergies Patient has no known allergies.   Home Medications Current Outpatient Medications  Medication Instructions  . triamcinolone (KENALOG) 0.025 % cream Apply a thin layer once daily as needed to itchy areas of body (never to face, groin, or skin folds). Do not use over entire body for more than 10 days in a row - just spot treat areas      Physical Exam BP (!) 138/91   Pulse 82   Temp 98.1 F (36.7 C) (Oral)   Resp 19   Ht 160 cm (5' 3)   Wt 103 kg (227 lb 11.8 oz)   SpO2 100%   BMI 40.34 kg/m  Pulse Oximetry interpretation is normal General: Well-appearing patient. no acute distress. Head: Normocephalic, atraumatic Psychiatric: Appropriate goal oriented speech. Judgement intact. Eye: Normal conjunctiva, No scleral icterus Ears, Nose, Throat, Mouth: Moist oral mucosa Cardiovascular:  Regular rate, normal rhythm, no murmurs Respiratory: Clear to auscultation bilaterally, no wheezes, crackles. No respiratory distress Abdomen: Soft, benign, gravid abdomen.  No significant tenderness to palpation. Extremities: No gross deformity. No edema. Skin: Warm, dry. No rashes Neurologic: Awake, alert. Moves all 4 extremities equally   ED Course  Vitals:   03/01/24 2219 03/01/24 2223 03/01/24 2225 03/01/24 2230  BP: (!) 144/100 (!) 143/92 (!) 139/97 (!) 138/91  BP Location:      Patient Position:      Pulse: 79 79 83 82  Resp: (!) 24 (!) 24 (!) 27 19  Temp:      TempSrc:      SpO2: 100% 98% 99% 100%  Weight:      Height:         Results Labs Reviewed  URINE CULTURE  CBC WITH DIFFERENTIAL      Result Value   WBC 8.51     Monocyte Distribution Width 17.7     RBC 4.72     Hemoglobin 13.6     Hematocrit 39     Mean Corpuscular Volume (MCV) 82     Mean  Corpuscular Hemoglobin (MCH) 29     Mean Corpuscular Hemoglobin Conc (MCHC) 35     Red Cell Distribution Width (RDW) 13.8     Platelet Count (PLT) 225     Mean Platelet Volume (MPV) 9.0     Neutrophils % 62     Lymphocytes % 28     Monocytes % 9     Eosinophils % 1     Basophils % 1     nRBC % 0     Neutrophils Absolute 5.30     Lymphocytes # 2.40     Monocytes # 0.70     Eosinophils # 0.10     Basophils # 0.00     nRBC Absolute 0.02    COMPREHENSIVE METABOLIC PANEL  PROTHROMBIN TIME (PT) WITH INR  APTT  URINALYSIS WITH REFLEX TO MICROSCOPIC  MAGNESIUM      Medical Decision Making Brandi [name] is a 28 y.o. female presenting to the ED with Pregnancy Problem (Pt is [redacted] weeks pregnant and is c/o right sided abdominal pain. Pt's mother states she doesn't feel the fetus moving since this afternoon. Pt c/o 8/10 abd pain. Pt states she has contractions every 5 mins. FHR 160 in triage.)    Medical Decision Making Problems Addressed: Hypertension during pregnancy in third trimester, unspecified hypertension in pregnancy type (CMD): complicated acute illness or injury Right upper quadrant abdominal pain: complicated acute illness or injury Third trimester pregnancy (CMD): complicated acute illness or injury Vision disturbance: complicated acute illness or injury  Amount and/or Complexity of Data Reviewed Labs: ordered.  Risk Prescription drug management. Decision regarding hospitalization.  Critical Care Total time providing critical care: 32 minutes    Medications  labetaloL  (TRANDATE ) injection 10 mg (10 mg intravenous Given 03/01/24 3858)   28 year old female patient with history of preeclampsia, [redacted] weeks pregnant presenting due to spots in her vision, right upper quadrant abdominal pain, intermittent contractions.  Initial blood pressure is elevated 150s over 90s.  Second 1 is also about the same.  She did take her amlodipine  today.  I do see there is a  possibility of preeclampsia here, impending labor considering patient's gestational age.  She does not have a headache right now.  No leg swelling.  Will treat with labetalol  at this time.  Lab work was established.  Fetal heart tones were obtained in the 160s.  I did check the patient's cervix.  Seems to be soft, maybe about 1 to 2 cm open however very high.  I did not feel babies had at this time.  Have placed for emergent transfer over to labor and delivery.  Dr. Sido with  OB/GYN has accepted.  Blood pressure after labetalol  is down to 138/91.  Labs still pending.  EMS is here to transfer the patient. Stable at this time for transfer.    CHA2DS2-VASc Score: N/A                  Procedures  Procedures    Diagnosis: 1. Third trimester pregnancy (CMD)      2. Hypertension during pregnancy in third trimester, unspecified hypertension in pregnancy type (CMD)      3. Vision disturbance      4. Right upper quadrant abdominal pain           Disposition:  Transfer to Another Facility   No follow-up provider specified.   ED Prescriptions   None         Emergency Medicine  Reina Rober Mt, MD Sat 03/01/2024; 10:31 PM    VOICE RECOGNITION ATTESTATION: This note was dictated using Dragon voice recognition software.  Although every effort has been made to proofread this documentation, it may contain inadvertent documented words or phrases. Please contact me with any questions or concerns regarding this documentation.       [1] No past medical history on file. [2] No past surgical history on file. [3] No family history on file. [4] Social History Tobacco Use  . Smoking status: Never  . Smokeless tobacco: Never

## 2024-03-01 NOTE — H&P (Signed)
 Chief Complaint: Decreased Fetal Movement and Laboring (Ctx starting 1600)   History of Present Illness  Brandi [name] is a 28 y.o. H5E7987 at [redacted]w[redacted]d with CHTN who presents to Labor and Delivery as a transfer from [place] with complaint of decreased fetal movement all day along with some contractions.  She denies any leakage of fluid or vaginal bleeding.  She states that she always have headaches and floaters. She takes Amylodipine for her BP. Pt had 2 mild range BP at [facility] and was give Labetalol  IV. She gets her care in [place] and just moved here.    Obstetrical History  # 1 - Date: 2020, Sex: None, Weight: None, GA: [redacted]w[redacted]d, Type: None, Apgar1: None, Apgar5: None, Living: None, Birth Comments: None  # 2 - Date: 07/13/21, Sex: Female, Weight: None, GA: [redacted]w[redacted]d, Type: None, Apgar1: None, Apgar5: None, Living: Living, Birth Comments: None  # 3 - Date: 04/27/23, Sex: Female, Weight: None, GA: [redacted]w[redacted]d, Type: None, Apgar1: None, Apgar5: None, Living: Living, Birth Comments: None  # 4 - Date: None, Sex: None, Weight: None, GA: None, Type: None, Apgar1: None, Apgar5: None, Living: None, Birth Comments: None   Past Medical History CHTN  Past Surgical History She has a past surgical history that includes Hernia repair (2023). X2 of the Umbilicus  Allergies Patient has no known allergies.  Medications Prescriptions Prior to Admission[1]  Family History Mother and Sisters with HTN   Social History She reports that she has never smoked. She has never used smokeless tobacco. She reports that she does not currently use alcohol. She reports that she does not use drugs.  Vital Signs: Vitals:   03/01/24 2153 03/01/24 2308  BP: 140/89 135/86  Pulse: 68 71  Resp: 18   Temp: 98.3 F (36.8 C)   TempSrc: Oral      Review of Systems  All other systems reviewed and are negative.     Physical Exam Vitals reviewed.  Constitutional:      Appearance: Normal appearance.   HENT:     Head: Normocephalic and atraumatic.     Nose: Nose normal.  Eyes:     Pupils: Pupils are equal, round, and reactive to light.  Cardiovascular:     Rate and Rhythm: Normal rate and regular rhythm.     Heart sounds: Normal heart sounds.  Pulmonary:     Effort: Pulmonary effort is normal.     Breath sounds: Normal breath sounds. No wheezing, rhonchi or rales.  Abdominal:     General: There is no distension.     Palpations: Abdomen is soft. There are no masses.     Tenderness: There is no abdominal tenderness. There is no rebound.  Musculoskeletal:        General: Normal range of motion.     Cervical back: Normal range of motion and neck supple.  Skin:    General: Skin is warm and dry.  Neurological:     Mental Status: She is alert and oriented to person, place, and time.     Fetal Heart Rate Evaluation: heart tones (within normal limits (110 to 160 bpm), variability moderate (6-25 bpm over baseline), acceleration pattern present - greater than 15 bpm over baseline for 15 seconds but less than 2 minutes).  I personally reviewed the FHR tracing  Cervix: LTC/High (internal os closed) TOCO: acontractile  Lab Results (last 24 hours)     Procedure Component Value Ref Range Date/Time   CBC with Differential [[id number]] Collected: 03/01/24 2217  Lab Status: Final result Specimen: Blood from Venous Updated: 03/01/24 2222   Narrative:     The following orders were created for panel order CBC with Differential. Procedure                               Abnormality         Status                    ---------                               -----------         ------                    CBC with Differential[[id number]]                           Final result               Please view results for these tests on the individual orders.   Comprehensive Metabolic Panel [[id number]]  (Abnormal) Collected: 03/01/24 2217   Lab Status: Final result Specimen: Blood from Venous Updated:  03/01/24 2239    Sodium 136 134 - 143 mmol/L     Potassium 3.7 3.5 - 5.1 mmol/L     Chloride 106 98 - 107 mmol/L     CO2 19* 21 - 31 mmol/L     Anion Gap 11 3 - 15 mmol/L     Glucose, Random 124 70 - 125 mg/dL     Blood Urea Nitrogen (BUN) 11 8 - 30 mg/dL     Creatinine 9.13 9.48 - 0.95 mg/dL     eGFR >09 >40 fO/fpw/8.26f7     Comment: GFR estimated by CKD-EPI equations (NKF 2021). Recommend confirmation of eGFR using Cystatin C based eGFR in complex cases for clinical decision-making.      Albumin 3.7 3.7 - 4.8 g/dL     Total Protein 6.6 6.3 - 8.3 g/dL     Bilirubin, Total 0.5 0.3 - 1.0 mg/dL     Alkaline Phosphatase (ALP) 64 38 - 120 U/L     Aspartate Aminotransferase (AST) 14 13 - 39 U/L     Alanine Aminotransferase (ALT) 7 7 - 52 U/L     Calcium 9.5 8.6 - 10.3 mg/dL     BUN/Creatinine Ratio --    Comment: Creatinine is normal, ratio is not clinically indicated.      Magnesium  [[id number]]  (Normal) Collected: 03/01/24 2217   Lab Status: Final result Specimen: Blood from Venous Updated: 03/01/24 2239    Magnesium  1.9 1.6 - 2.5 mg/dL    CBC with Differential [[id number]] Collected: 03/01/24 2217   Lab Status: Final result Specimen: Blood from Venous Updated: 03/01/24 2222    WBC 8.51 3.60 - 11.70 10*3/uL     Monocyte Distribution Width 17.7 <20.1     RBC 4.72 3.72 - 5.24 10*6/uL     Hemoglobin 13.6 10.8 - 15.5 g/dL     Hematocrit 39 34 - 46 %     Mean Corpuscular Volume (MCV) 82 80 - 100 fL     Mean Corpuscular Hemoglobin (MCH) 29 26 - 34 pg     Mean Corpuscular Hemoglobin Conc (MCHC) 35 32 - 35 g/dL     Red Cell Distribution Width (RDW)  13.8 12.2 - 16.6 %     Platelet Count (PLT) 225 153 - 400 10*3/uL     Mean Platelet Volume (MPV) 9.0 7.3 - 11.2 fL     Neutrophils % 62 %     Lymphocytes % 28 %     Monocytes % 9 %     Eosinophils % 1 %     Basophils % 1 %     nRBC % 0 0 - 0 %     Neutrophils Absolute 5.30 1.60 - 8.20 10*3/uL     Lymphocytes # 2.40 1.07 - 3.45 10*3/uL      Monocytes # 0.70 0.20 - 0.85 10*3/uL     Eosinophils # 0.10 0.00 - 0.30 10*3/uL     Basophils # 0.00 0.00 - 0.10 10*3/uL     nRBC Absolute 0.02 <=1.00 10*3/uL    Prothrombin Time (PT) with INR [[id number]]  (Abnormal) Collected: 03/01/24 2216   Lab Status: Final result Specimen: Blood from Venous Updated: 03/01/24 2233    Prothrombin Time (PT) 11.7* 11.8 - 14.4 seconds     International Normalized Ratio (INR) 0.9      aPTT [[id number]]  (Normal) Collected: 03/01/24 2216   Lab Status: Final result Specimen: Blood from Venous Updated: 03/01/24 2233    Activated Partial Thromboplastin Time (aPTT) 25.9 23.8 - 35.4 seconds         Assessment Pt is a H5E7987 at [redacted]w[redacted]d with CHTN who presents with decreased fetal movement. FHR is CAT I. Pt with mild range HTN consistent with the diagnosis of CHTN.  Pt does not have Preeclampsia.  Plan Pt had a BPP yesterday at her MFM that was 8/8 Will D/C home with fetal kick counts and labor precautions Pt given list of OB groups so that she can transfer her care       [1] Medications Prior to Admission  Medication Sig Dispense Refill Last Dose/Taking  . amLODIPine  (NORVASC ) 10 mg tablet Take 10 mg by mouth daily.   02/29/2024 Bedtime  . hydrOXYzine  (ATARAX ) 10 mg tablet Take 10 mg by mouth once.   02/29/2024 Bedtime  . sertraline  (ZOLOFT ) 25 mg tablet Take 100 mg by mouth daily.   02/29/2024 Bedtime  . triamcinolone (KENALOG) 0.025 % cream Apply a thin layer once daily as needed to itchy areas of body (never to face, groin, or skin folds). Do not use over entire body for more than 10 days in a row - just spot treat areas 454 g 1 Unknown  *Some images could not be shown.

## 2024-03-02 ENCOUNTER — Encounter (HOSPITAL_COMMUNITY): Payer: Self-pay | Admitting: Obstetrics & Gynecology

## 2024-03-02 ENCOUNTER — Inpatient Hospital Stay (HOSPITAL_COMMUNITY)
Admission: AD | Admit: 2024-03-02 | Discharge: 2024-03-02 | Disposition: A | Discharge status: Home / Self Care | Attending: Obstetrics & Gynecology | Admitting: Obstetrics & Gynecology

## 2024-03-02 ENCOUNTER — Other Ambulatory Visit: Payer: Self-pay

## 2024-03-02 DIAGNOSIS — O26893 Other specified pregnancy related conditions, third trimester: Secondary | ICD-10-CM

## 2024-03-02 DIAGNOSIS — O133 Gestational [pregnancy-induced] hypertension without significant proteinuria, third trimester: Secondary | ICD-10-CM | POA: Insufficient documentation

## 2024-03-02 DIAGNOSIS — G44209 Tension-type headache, unspecified, not intractable: Secondary | ICD-10-CM

## 2024-03-02 DIAGNOSIS — Z3A36 36 weeks gestation of pregnancy: Secondary | ICD-10-CM

## 2024-03-02 DIAGNOSIS — R519 Headache, unspecified: Secondary | ICD-10-CM | POA: Insufficient documentation

## 2024-03-02 LAB — PROTEIN / CREATININE RATIO, URINE
Creatinine, Urine: 242 mg/dL
Protein Creatinine Ratio: 0.08 mg/mg{creat} (ref 0.00–0.15)
Total Protein, Urine: 20 mg/dL

## 2024-03-02 LAB — COMPREHENSIVE METABOLIC PANEL WITH GFR
ALT: 8 U/L (ref 0–44)
AST: 17 U/L (ref 15–41)
Albumin: 2.7 g/dL — ABNORMAL LOW (ref 3.5–5.0)
Alkaline Phosphatase: 66 U/L (ref 38–126)
Anion gap: 9 (ref 5–15)
BUN: 10 mg/dL (ref 6–20)
CO2: 19 mmol/L — ABNORMAL LOW (ref 22–32)
Calcium: 8.9 mg/dL (ref 8.9–10.3)
Chloride: 109 mmol/L (ref 98–111)
Creatinine, Ser: 0.95 mg/dL (ref 0.44–1.00)
GFR, Estimated: >60 mL/min (ref >60)
Glucose, Bld: 87 mg/dL (ref 70–99)
Potassium: 3.9 mmol/L (ref 3.5–5.1)
Sodium: 137 mmol/L (ref 135–145)
Total Bilirubin: 0.7 mg/dL (ref 0.0–1.2)
Total Protein: 6.1 g/dL — ABNORMAL LOW (ref 6.5–8.1)

## 2024-03-02 LAB — CBC
HCT: 37.3 % (ref 36.0–46.0)
Hemoglobin: 12.8 g/dL (ref 12.0–15.0)
MCH: 28.6 pg (ref 26.0–34.0)
MCHC: 34.3 g/dL (ref 30.0–36.0)
MCV: 83.3 fL (ref 80.0–100.0)
Platelets: 214 K/uL (ref 150–400)
RBC: 4.48 MIL/uL (ref 3.87–5.11)
RDW: 13.2 % (ref 11.5–15.5)
WBC: 9.4 K/uL (ref 4.0–10.5)
nRBC: 0.2 % (ref 0.0–0.2)

## 2024-03-02 LAB — URINALYSIS, ROUTINE W REFLEX MICROSCOPIC
Bilirubin Urine: NEGATIVE
Glucose, UA: NEGATIVE mg/dL
Hgb urine dipstick: NEGATIVE
Ketones, ur: NEGATIVE mg/dL
Leukocytes,Ua: NEGATIVE
Nitrite: NEGATIVE
Protein, ur: NEGATIVE mg/dL
Specific Gravity, Urine: 1.019 (ref 1.005–1.030)
pH: 6 (ref 5.0–8.0)

## 2024-03-02 MED ORDER — AMLODIPINE BESYLATE 10 MG PO TABS
10.0000 mg | ORAL_TABLET | Freq: Every day | ORAL | Status: DC
  Administered 2024-03-02: 10 mg via ORAL
  Filled 2024-03-02: qty 1

## 2024-03-02 MED ORDER — ACETAMINOPHEN 500 MG PO TABS
1000.0000 mg | ORAL_TABLET | Freq: Once | ORAL | Status: AC
  Administered 2024-03-02: 1000 mg via ORAL
  Filled 2024-03-02: qty 2

## 2024-03-02 NOTE — MAU Note (Addendum)
 Brandi [name] is a 28 y.o. at 101w0d here in MAU reporting: decreased fetal movement yesterday evening. Reports that she still feels movement, but not as much as usual. Patient was seen in the [facility] ED for for decreased fetal movement, hypertension, and headache. Patient was given BP meds. Baby was reactive on monitor. States that she had to drink juice to get baby reactive. Also reports having a headache that started about 1 hour ago and seeing flashes of light. Reports that labs were done at [facility]. Denies VB.  States that she also had an episode of leaking when she was going to the bathroom around 4 pm. No odor. Leaking has not continued.   Onset of complaint: Yesterday evening Pain score: 7/10 Headache Vitals:   03/02/24 1903  BP: (!) 146/94  Pulse: 71  Resp: 16  Temp: 98.4 F (36.9 C)  SpO2: 100%     FHT: 140  Lab orders placed from triage: urine

## 2024-03-02 NOTE — MAU Provider Note (Signed)
 History     CSN: [id number]  Arrival date and time: 03/02/24 1849   Event Date/Time   First Provider Initiated Contact with Patient 03/02/24 2037      Chief Complaint  Patient presents with   Decreased Fetal Movement   Headache   Hypertension   Patient is a 28 y/o G4P2012 at [redacted]w[redacted]d who presents for headache, vision changes, and RUQ that occurred earlier today. Headache still present on exam but RUQ pain and vision changes have resolved. Of note, she is on amlodipine  10mg  daily for gHTN. This was started in June of 2025. She endorses being semi-compliant with the medication. Her last dose taken was 02/29/2024. She reports BP's at home in the 150's systolic. She was seen at [facility] ED yesterday for the same problem. Visit reviewed, BP's were elevated but not severe range. She was given dose of labetalol  and discharged home. Pre-E labs were normal at that time. She did have PEC with her previous pregnancy requiring early induction. Denies vaginal bleeding, LOF and contractions.     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   COVID-19 virus infection 07/13/2021   Depression    HSV-1 infection    Obesity affecting pregnancy 03/20/2023   PCOS (polycystic ovarian syndrome) 09/21/2022   Preeclampsia 07/13/2021   PTSD (post-traumatic stress disorder)    Tinea corporis 01/11/2021   UTI (urinary tract infection)     Past Surgical History:  Procedure Laterality Date   HERNIA REPAIR  07/03/2006   HERNIA REPAIR  03/30/2022    Family History  Problem Relation Age of Onset   Heart disease Mother    Stroke Mother    Hypertension Mother    Bipolar disorder Mother    Healthy Father    Anxiety disorder Sister    Depression Sister    Hypertension Sister    Hypertension Maternal Grandmother    Lung cancer Maternal Grandmother    Stroke Maternal Grandfather    Colon cancer Maternal Grandfather    Depression Paternal Grandmother     Hypertension Paternal Grandmother     Social History   Tobacco Use   Smoking status: Former    Current packs/day: 0.50    Types: Cigarettes    Passive exposure: Never   Smokeless tobacco: Never  Vaping Use   Vaping status: Never Used  Substance Use Topics   Alcohol use: Not Currently    Comment: occasional prior to pregnancy   Drug use: Never    Allergies: No Known Allergies  Medications Prior to Admission  Medication Sig Dispense Refill Last Dose/Taking   amLODipine  (NORVASC ) 10 MG tablet Take 1 tablet (10 mg total) by mouth daily. 90 tablet 0 03/01/2024   Prenatal Vit-Fe Fumarate-FA (PRENATAL VITAMIN PO) Take by mouth daily.   03/01/2024   sertraline  (ZOLOFT ) 100 MG tablet Take 1 tablet (100 mg total) by mouth at bedtime. 30 tablet 6 03/01/2024   aspirin  EC 81 MG tablet Take 1 tablet (81 mg total) by mouth daily. Swallow whole. (Patient not taking: Reported on 02/29/2024) 30 tablet 12    cetirizine  (ZYRTEC ) 10 MG tablet Take 1 tablet (10 mg total) by mouth daily. 60 tablet 6    Cholecalciferol  ([employer] D3) 125 MCG (5000 UT) capsule Take 1 capsule (5,000 Units total) by mouth daily. (Patient not taking: Reported on 02/29/2024) 250 capsule 1    fluticasone  (FLONASE ) 50 MCG/ACT nasal spray Place 1-2 sprays into both nostrils daily. (Patient not  taking: Reported on 02/29/2024) 9.9 mL 2    hydrOXYzine  (VISTARIL ) 50 MG capsule Take 1 capsule (50 mg total) by mouth at bedtime. 30 capsule 6     Negative aside from what is listed in the HPI.  Physical Exam   Blood pressure 137/88, pulse 68, temperature 98.4 F (36.9 C), temperature source Oral, resp. rate 16, height 5' 2 (1.575 m), weight 103.8 kg, last menstrual period 06/24/2023, SpO2 100%, not currently breastfeeding.  Physical Exam Vitals and nursing note reviewed.  Constitutional:      Appearance: She is well-developed.  HENT:     Head: Normocephalic and atraumatic.     Mouth/Throat:     Mouth: Mucous membranes are moist.  Eyes:      Extraocular Movements: Extraocular movements intact.  Cardiovascular:     Rate and Rhythm: Normal rate and regular rhythm.  Pulmonary:     Effort: Pulmonary effort is normal.  Abdominal:     Palpations: Abdomen is soft.     Tenderness: There is no abdominal tenderness.  Skin:    Capillary Refill: Capillary refill takes less than 2 seconds.  Neurological:     General: No focal deficit present.     Mental Status: She is alert.     MAU Course    MDM - Moderate  Assessment and Plan   Patient is a 28 y/o G4P2012 at [redacted]w[redacted]d who presents for headache, vision changes, and RUQ that occurred earlier today.  -PIH labs normal this visit -Headache significantly improved with tylenol  -Elevated BP's in MAU but none in severe range.   -Stable for discharge home -Administered home dose of amlodipine  in MAU with improvement in BP's -Discussed with Dr. Jayne, with previous history and presenting symptoms, would recommend that she be scheduled for IOL at 37 weeks.  -Encouraged patient to take amlodipine  every day, attempting to take at the same time every day. -Has OB F/U on 03/07/2024. -All questions answered, anticipatory guidance and detailed pre-eclampsia precautions provided.  Jeannelle Wiens L Breah Joa 03/02/2024, 10:24 PM

## 2024-03-03 ENCOUNTER — Inpatient Hospital Stay (HOSPITAL_COMMUNITY)
Admission: AD | Admit: 2024-03-03 | Discharge: 2024-03-06 | DRG: 806 | Disposition: A | Discharge status: Home / Self Care | Payer: Self-pay | Attending: Family Medicine | Admitting: Family Medicine

## 2024-03-03 ENCOUNTER — Encounter (HOSPITAL_COMMUNITY): Payer: Self-pay | Admitting: Obstetrics & Gynecology

## 2024-03-03 DIAGNOSIS — Z87891 Personal history of nicotine dependence: Secondary | ICD-10-CM | POA: Diagnosis not present

## 2024-03-03 DIAGNOSIS — A6000 Herpesviral infection of urogenital system, unspecified: Secondary | ICD-10-CM | POA: Diagnosis present

## 2024-03-03 DIAGNOSIS — Z3A36 36 weeks gestation of pregnancy: Secondary | ICD-10-CM

## 2024-03-03 DIAGNOSIS — O4593 Premature separation of placenta, unspecified, third trimester: Secondary | ICD-10-CM | POA: Diagnosis present

## 2024-03-03 DIAGNOSIS — O1414 Severe pre-eclampsia complicating childbirth: Secondary | ICD-10-CM | POA: Diagnosis not present

## 2024-03-03 DIAGNOSIS — O9832 Other infections with a predominantly sexual mode of transmission complicating childbirth: Secondary | ICD-10-CM | POA: Diagnosis present

## 2024-03-03 DIAGNOSIS — G44209 Tension-type headache, unspecified, not intractable: Secondary | ICD-10-CM | POA: Diagnosis not present

## 2024-03-03 DIAGNOSIS — O4590 Premature separation of placenta, unspecified, unspecified trimester: Secondary | ICD-10-CM | POA: Diagnosis not present

## 2024-03-03 DIAGNOSIS — O99344 Other mental disorders complicating childbirth: Secondary | ICD-10-CM | POA: Diagnosis not present

## 2024-03-03 DIAGNOSIS — O26893 Other specified pregnancy related conditions, third trimester: Secondary | ICD-10-CM | POA: Diagnosis not present

## 2024-03-03 DIAGNOSIS — Z560 Unemployment, unspecified: Secondary | ICD-10-CM | POA: Diagnosis not present

## 2024-03-03 DIAGNOSIS — O0993 Supervision of high risk pregnancy, unspecified, third trimester: Principal | ICD-10-CM

## 2024-03-03 DIAGNOSIS — O114 Pre-existing hypertension with pre-eclampsia, complicating childbirth: Principal | ICD-10-CM | POA: Diagnosis present

## 2024-03-03 DIAGNOSIS — Z79899 Other long term (current) drug therapy: Secondary | ICD-10-CM

## 2024-03-03 DIAGNOSIS — O1092 Unspecified pre-existing hypertension complicating childbirth: Secondary | ICD-10-CM | POA: Diagnosis present

## 2024-03-03 DIAGNOSIS — Z8616 Personal history of COVID-19: Secondary | ICD-10-CM | POA: Diagnosis not present

## 2024-03-03 DIAGNOSIS — F332 Major depressive disorder, recurrent severe without psychotic features: Secondary | ICD-10-CM | POA: Diagnosis present

## 2024-03-03 DIAGNOSIS — O119 Pre-existing hypertension with pre-eclampsia, unspecified trimester: Secondary | ICD-10-CM | POA: Diagnosis present

## 2024-03-03 DIAGNOSIS — Z8249 Family history of ischemic heart disease and other diseases of the circulatory system: Secondary | ICD-10-CM

## 2024-03-03 LAB — CBC
HCT: 37.3 % (ref 36.0–46.0)
Hemoglobin: 12.6 g/dL (ref 12.0–15.0)
MCH: 28.4 pg (ref 26.0–34.0)
MCHC: 33.8 g/dL (ref 30.0–36.0)
MCV: 84.2 fL (ref 80.0–100.0)
Platelets: 214 K/uL (ref 150–400)
RBC: 4.43 MIL/uL (ref 3.87–5.11)
RDW: 13.2 % (ref 11.5–15.5)
WBC: 9.3 K/uL (ref 4.0–10.5)
nRBC: 0 % (ref 0.0–0.2)

## 2024-03-03 LAB — PROTEIN / CREATININE RATIO, URINE
Creatinine, Urine: 313 mg/dL
Protein Creatinine Ratio: 0.11 mg/mg{creat} (ref 0.00–0.15)
Total Protein, Urine: 35 mg/dL

## 2024-03-03 LAB — TYPE AND SCREEN
ABO/RH(D): O POS
Antibody Screen: NEGATIVE

## 2024-03-03 LAB — COMPREHENSIVE METABOLIC PANEL WITH GFR
ALT: 7 U/L (ref 0–44)
AST: 16 U/L (ref 15–41)
Albumin: 2.8 g/dL — ABNORMAL LOW (ref 3.5–5.0)
Alkaline Phosphatase: 70 U/L (ref 38–126)
Anion gap: 11 (ref 5–15)
BUN: 9 mg/dL (ref 6–20)
CO2: 19 mmol/L — ABNORMAL LOW (ref 22–32)
Calcium: 9.2 mg/dL (ref 8.9–10.3)
Chloride: 107 mmol/L (ref 98–111)
Creatinine, Ser: 0.87 mg/dL (ref 0.44–1.00)
GFR, Estimated: >60 mL/min (ref >60)
Glucose, Bld: 71 mg/dL (ref 70–99)
Potassium: 4.1 mmol/L (ref 3.5–5.1)
Sodium: 137 mmol/L (ref 135–145)
Total Bilirubin: 0.6 mg/dL (ref 0.0–1.2)
Total Protein: 6.3 g/dL — ABNORMAL LOW (ref 6.5–8.1)

## 2024-03-03 MED ORDER — EPHEDRINE 5 MG/ML INJ: 10.0000 mg | INTRAVENOUS | Status: DC | PRN

## 2024-03-03 MED ORDER — LACTATED RINGERS IV SOLN: INTRAVENOUS | Status: DC

## 2024-03-03 MED ORDER — PENICILLIN G POT IN DEXTROSE 60000 UNIT/ML IV SOLN
3.0000 10*6.[IU] | INTRAVENOUS | Status: DC
  Administered 2024-03-04 (×2): 3 10*6.[IU] via INTRAVENOUS
  Filled 2024-03-03 (×3): qty 50

## 2024-03-03 MED ORDER — LIDOCAINE HCL (PF) 1 % IJ SOLN: 30.0000 mL | INTRAMUSCULAR | Status: DC | PRN

## 2024-03-03 MED ORDER — NIFEDIPINE 10 MG PO CAPS
10.0000 mg | ORAL_CAPSULE | ORAL | Status: DC | PRN
  Administered 2024-03-03: 10 mg via ORAL
  Filled 2024-03-03: qty 1

## 2024-03-03 MED ORDER — FENTANYL-BUPIVACAINE-NACL 0.5-0.125-0.9 MG/250ML-% EP SOLN
12.0000 mL/h | EPIDURAL | Status: DC | PRN
  Administered 2024-03-04: 12 mL/h via EPIDURAL
  Filled 2024-03-03: qty 250

## 2024-03-03 MED ORDER — NIFEDIPINE 10 MG PO CAPS
20.0000 mg | ORAL_CAPSULE | ORAL | Status: DC | PRN
  Filled 2024-03-03: qty 2

## 2024-03-03 MED ORDER — OXYTOCIN BOLUS FROM INFUSION
333.0000 mL | Freq: Once | INTRAVENOUS | Status: AC
  Administered 2024-03-04: 333 mL via INTRAVENOUS

## 2024-03-03 MED ORDER — HYDROXYZINE HCL 50 MG PO TABS
50.0000 mg | ORAL_TABLET | Freq: Four times a day (QID) | ORAL | Status: DC | PRN
  Administered 2024-03-03: 50 mg via ORAL
  Filled 2024-03-03: qty 1

## 2024-03-03 MED ORDER — MISOPROSTOL 25 MCG QUARTER TABLET
25.0000 ug | ORAL_TABLET | Freq: Once | ORAL | Status: AC
  Administered 2024-03-03: 25 ug via VAGINAL
  Filled 2024-03-03: qty 1

## 2024-03-03 MED ORDER — TERBUTALINE SULFATE 1 MG/ML IJ SOLN: 0.2500 mg | Freq: Once | INTRAMUSCULAR | Status: DC | PRN

## 2024-03-03 MED ORDER — DIPHENHYDRAMINE HCL 50 MG/ML IJ SOLN: 12.5000 mg | INTRAMUSCULAR | Status: DC | PRN

## 2024-03-03 MED ORDER — MISOPROSTOL 50MCG HALF TABLET: 50.0000 ug | ORAL_TABLET | ORAL | Status: DC | PRN

## 2024-03-03 MED ORDER — MAGNESIUM SULFATE BOLUS VIA INFUSION
4.0000 g | Freq: Once | INTRAVENOUS | Status: AC
  Administered 2024-03-03: 4 g via INTRAVENOUS
  Filled 2024-03-03: qty 1000

## 2024-03-03 MED ORDER — OXYCODONE-ACETAMINOPHEN 5-325 MG PO TABS: 1.0000 | ORAL_TABLET | ORAL | Status: DC | PRN

## 2024-03-03 MED ORDER — PHENYLEPHRINE 80 MCG/ML (10ML) SYRINGE FOR IV PUSH (FOR BLOOD PRESSURE SUPPORT): 80.0000 ug | PREFILLED_SYRINGE | INTRAVENOUS | Status: DC | PRN

## 2024-03-03 MED ORDER — MAGNESIUM SULFATE 40 GM/1000ML IV SOLN
2.0000 g/h | INTRAVENOUS | Status: DC
  Administered 2024-03-03: 2 g/h via INTRAVENOUS
  Filled 2024-03-03: qty 1000

## 2024-03-03 MED ORDER — LACTATED RINGERS IV SOLN: 500.0000 mL | INTRAVENOUS | Status: DC | PRN

## 2024-03-03 MED ORDER — LACTATED RINGERS IV SOLN
500.0000 mL | Freq: Once | INTRAVENOUS | Status: AC
  Administered 2024-03-03: 500 mL via INTRAVENOUS

## 2024-03-03 MED ORDER — OXYTOCIN-SODIUM CHLORIDE 30-0.9 UT/500ML-% IV SOLN
2.5000 [IU]/h | INTRAVENOUS | Status: DC
  Filled 2024-03-03: qty 500

## 2024-03-03 MED ORDER — MISOPROSTOL 50MCG HALF TABLET
50.0000 ug | ORAL_TABLET | Freq: Once | ORAL | Status: AC
  Administered 2024-03-03: 50 ug via ORAL
  Filled 2024-03-03: qty 1

## 2024-03-03 MED ORDER — FLEET ENEMA RE ENEM: 1.0000 | ENEMA | RECTAL | Status: DC | PRN

## 2024-03-03 MED ORDER — ONDANSETRON HCL 4 MG/2ML IJ SOLN
4.0000 mg | Freq: Four times a day (QID) | INTRAMUSCULAR | Status: DC | PRN
  Administered 2024-03-03: 4 mg via INTRAVENOUS
  Filled 2024-03-03: qty 2

## 2024-03-03 MED ORDER — OXYCODONE-ACETAMINOPHEN 5-325 MG PO TABS: 2.0000 | ORAL_TABLET | ORAL | Status: DC | PRN

## 2024-03-03 MED ORDER — LABETALOL HCL 5 MG/ML IV SOLN: 40.0000 mg | INTRAVENOUS | Status: DC | PRN

## 2024-03-03 MED ORDER — FENTANYL CITRATE (PF) 100 MCG/2ML IJ SOLN
50.0000 ug | INTRAMUSCULAR | Status: DC | PRN
  Administered 2024-03-03: 100 ug via INTRAVENOUS
  Filled 2024-03-03: qty 2

## 2024-03-03 MED ORDER — SOD CITRATE-CITRIC ACID 500-334 MG/5ML PO SOLN: 30.0000 mL | ORAL | Status: DC | PRN

## 2024-03-03 MED ORDER — ACETAMINOPHEN 325 MG PO TABS
650.0000 mg | ORAL_TABLET | ORAL | Status: DC | PRN
  Administered 2024-03-04: 650 mg via ORAL
  Filled 2024-03-03 (×2): qty 2

## 2024-03-03 MED ORDER — BUTALBITAL-APAP-CAFFEINE 50-325-40 MG PO TABS
2.0000 | ORAL_TABLET | Freq: Three times a day (TID) | ORAL | Status: DC | PRN
  Administered 2024-03-03: 2 via ORAL
  Filled 2024-03-03: qty 2

## 2024-03-03 MED ORDER — AMLODIPINE BESYLATE 5 MG PO TABS
10.0000 mg | ORAL_TABLET | Freq: Every day | ORAL | Status: DC
  Administered 2024-03-03 – 2024-03-06 (×4): 10 mg via ORAL
  Filled 2024-03-03: qty 1
  Filled 2024-03-03: qty 2
  Filled 2024-03-03: qty 1
  Filled 2024-03-03 (×2): qty 2
  Filled 2024-03-03: qty 1

## 2024-03-03 MED ORDER — SERTRALINE HCL 50 MG PO TABS
100.0000 mg | ORAL_TABLET | Freq: Every day | ORAL | Status: DC
  Administered 2024-03-03 – 2024-03-05 (×3): 100 mg via ORAL
  Filled 2024-03-03: qty 2
  Filled 2024-03-03: qty 1
  Filled 2024-03-03: qty 2
  Filled 2024-03-03: qty 1

## 2024-03-03 MED ORDER — SODIUM CHLORIDE 0.9 % IV SOLN
5.0000 10*6.[IU] | Freq: Once | INTRAVENOUS | Status: AC
  Administered 2024-03-03: 5 10*6.[IU] via INTRAVENOUS
  Filled 2024-03-03: qty 5

## 2024-03-03 MED ORDER — NIFEDIPINE 10 MG PO CAPS
20.0000 mg | ORAL_CAPSULE | ORAL | Status: DC | PRN
  Administered 2024-03-03: 20 mg via ORAL

## 2024-03-03 NOTE — H&P (Signed)
 Brandi [name] is a 28 y.o. H5E7987 female at [redacted]w[redacted]d by LMP c/w 11.3wk u/s presenting for IOL due to dx of cHTN now severe SIPE. Reports H/A, visual floaters; also having severe range BPs; pre-e labs normal. Reports active fetal movement, contractions: irreg/mild, vaginal bleeding: none, membranes: intact.  Initiated prenatal care at [facility] at 11.4 wks.   Most recent u/s : [redacted]w[redacted]d, EFW 48%, AFI 18cm, ant placenta, cephalic .   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   COVID-19 virus infection 07/13/2021   Depression    HSV-1 infection    Obesity affecting pregnancy 03/20/2023   PCOS (polycystic ovarian syndrome) 09/21/2022   Preeclampsia 07/13/2021   PTSD (post-traumatic stress disorder)    Tinea corporis 01/11/2021   UTI (urinary tract infection)     Past Surgical History: Past Surgical History:  Procedure Laterality Date   HERNIA REPAIR  07/03/2006   HERNIA REPAIR  03/30/2022    Obstetrical History: OB History     Gravida  4   Para  2   Term  2   Preterm      AB  1   Living  2      SAB  1   IAB      Ectopic      Multiple  0   Live Births  2           Social History: Social History   Socioeconomic History   Marital status: Married    Spouse name: Samir   Number of children: 1   Years of education: Not on file   Highest education level: Associate degree: academic program  Occupational History   Occupation: unemployed  Tobacco Use   Smoking status: Former    Current packs/day: 0.50    Types: Cigarettes    Passive exposure: Never   Smokeless tobacco: Never  Vaping Use   Vaping status: Never Used  Substance and Sexual Activity   Alcohol use: Not Currently    Comment: occasional prior to pregnancy   Drug use: Never   Sexual activity: Not  Currently    Birth control/protection: None  Other Topics Concern   Not on file  Social History Narrative   Not on file   Social Drivers of Health   Financial Resource Strain: Not on file  Food Insecurity: No Food Insecurity (02/06/2024)   Received from [facility]   Hunger Vital Sign    Within the past 12 months, you worried that your food would run out before you got the money to buy more.: Never true    Within the past 12 months, the food you bought just didn't last and you didn't have money to get more.: Never true  Transportation Needs: No Transportation Needs (02/06/2024)   Received from [facility] - Transportation    In the past 12 months, has lack of transportation kept you from medical appointments or from getting medications?: No    In the past 12 months, has lack of transportation kept you from meetings, work, or from getting things needed for daily living?: No  Physical Activity: Not on file  Stress: Not on file  Social Connections: Not on file    Family History: Family History  Problem Relation Age of Onset   Heart disease Mother  Stroke Mother    Hypertension Mother    Bipolar disorder Mother    Healthy Father    Anxiety disorder Sister    Depression Sister    Hypertension Sister    Hypertension Maternal Grandmother    Lung cancer Maternal Grandmother    Stroke Maternal Grandfather    Colon cancer Maternal Grandfather    Depression Paternal Grandmother    Hypertension Paternal Grandmother     Allergies: No Known Allergies  Medications Prior to Admission  Medication Sig Dispense Refill Last Dose/Taking   amLODipine  (NORVASC ) 10 MG tablet Take 1 tablet (10 mg total) by mouth daily. 90 tablet 0    aspirin  EC 81 MG tablet Take 1 tablet (81 mg total) by mouth daily. Swallow whole. (Patient not taking: Reported on 02/29/2024) 30 tablet 12    cetirizine  (ZYRTEC ) 10 MG tablet Take 1 tablet (10 mg total) by mouth daily. 60 tablet 6    hydrOXYzine   (VISTARIL ) 50 MG capsule Take 1 capsule (50 mg total) by mouth at bedtime. 30 capsule 6    Prenatal Vit-Fe Fumarate-FA (PRENATAL VITAMIN PO) Take by mouth daily.      sertraline  (ZOLOFT ) 100 MG tablet Take 1 tablet (100 mg total) by mouth at bedtime. 30 tablet 6     Review of Systems  Pertinent pos/neg as indicated in HPI  Blood pressure (!) 145/79, pulse 97, temperature 98.4 F (36.9 C), resp. rate 18, height 5' 2 (1.575 m), weight 103.9 kg, last menstrual period 06/24/2023, not currently breastfeeding. General appearance: alert, cooperative, and mild distress Lungs: clear to auscultation bilaterally Heart: regular rate and rhythm Abdomen: gravid, soft, non-tender, EFW by Leopold's approximately 6lbs Extremities: 1+ edema  Fetal monitoring: FHR: 150s bpm, variability: moderate,  Accelerations: Present,  decelerations:  Absent Uterine activity: irreg, mild   Presentation: cephalic by bedside u/s in MAU   Prenatal labs: ABO, Rh: --/--/PENDING (09/01 2014) Antibody: PENDING (09/01 2014) Rubella: 1.38 (03/12 1621) RPR: Non Reactive (07/16 0855)  HBsAg: Negative (03/12 1621)  HIV: Non Reactive (07/16 0855)  GBS: Negative/-- (10/17 0816)  2hr GTT: 64/125/117  Prenatal Transfer Tool  Maternal Diabetes: No Genetic Screening: Normal Maternal Ultrasounds/Referrals: Normal Fetal Ultrasounds or other Referrals:  None Maternal Substance Abuse:  No Significant Maternal Medications:  Meds include: Other: Norvasc , Zoloft  Significant Maternal Lab Results: Other: GBS unknown  Results for orders placed or performed during the hospital encounter of 03/03/24 (from the past 24 hours)  CBC   Collection Time: 03/03/24  6:48 PM  Result Value Ref Range   WBC 9.3 4.0 - 10.5 K/uL   RBC 4.43 3.87 - 5.11 MIL/uL   Hemoglobin 12.6 12.0 - 15.0 g/dL   HCT 62.6 63.9 - 53.9 %   MCV 84.2 80.0 - 100.0 fL   MCH 28.4 26.0 - 34.0 pg   MCHC 33.8 30.0 - 36.0 g/dL   RDW 86.7 88.4 - 84.4 %   Platelets 214  150 - 400 K/uL   nRBC 0.0 0.0 - 0.2 %  Comprehensive metabolic panel   Collection Time: 03/03/24  6:48 PM  Result Value Ref Range   Sodium 137 135 - 145 mmol/L   Potassium 4.1 3.5 - 5.1 mmol/L   Chloride 107 98 - 111 mmol/L   CO2 19 (L) 22 - 32 mmol/L   Glucose, Bld 71 70 - 99 mg/dL   BUN 9 6 - 20 mg/dL   Creatinine, Ser 9.12 0.44 - 1.00 mg/dL   Calcium 9.2 8.9 - 89.6 mg/dL  Total Protein 6.3 (L) 6.5 - 8.1 g/dL   Albumin 2.8 (L) 3.5 - 5.0 g/dL   AST 16 15 - 41 U/L   ALT 7 0 - 44 U/L   Alkaline Phosphatase 70 38 - 126 U/L   Total Bilirubin 0.6 0.0 - 1.2 mg/dL   GFR, Estimated >39 >39 mL/min   Anion gap 11 5 - 15  Type and screen [facility]   Collection Time: 03/03/24  8:14 PM  Result Value Ref Range   ABO/RH(D) PENDING    Antibody Screen PENDING    Sample Expiration      03/06/2024,2359 Performed at [facility], [address] [place], [place] [address]      Assessment:  [redacted]w[redacted]d SIUP  H5E7987  cHTN w severe SIPE  Cat 1 FHR  GBS Negative/-- (10/17 0816)  Plan:  Admit to L&D  IV pain meds/epidural prn active labor  Plan on cervical ripening with dual cytotec  to start, followed by q 4h oral cytotec  dosing until foley able to be placed  Mag sulfate infusion  Continue home Norvasc  10mg   PCN for unknown GBS and preterm  CBC/CMP/Mag level q 6h  Anticipate vag delivery   Plans to breastfeed  Contraception: NFP  Circumcision: no  Suzen JONETTA Gentry CNM 03/03/2024, 8:40 PM

## 2024-03-03 NOTE — MAU Provider Note (Addendum)
 Chief Complaint:  Headache and Hypertension  HPI  Event Date/Time  First Provider Initiated Contact with Patient 03/03/24 1756     Brandi [name] is a 29 y.o. [id number] at [redacted]w[redacted]d who presents to maternity admissions reporting headache, vision changes, decreased fetal movement, and increased fatigue over the past 4 days. Seen on 03/01/24 at [facility] and yesterday, 03/02/24 at MAU for the same. Yesterday headache resolved with Tylenol . Patient took Tylenol  1g at 1300 today and there was no improvement. Has had dark floaters in vision throughout pregnancy, yesterday noticed sparkles that are not present today. Fetal movement decreased 4 days ago and has stayed the same.  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   COVID-19 virus infection 07/13/2021   Depression    HSV-1 infection    Obesity affecting pregnancy 03/20/2023   PCOS (polycystic ovarian syndrome) 09/21/2022   Preeclampsia 07/13/2021   PTSD (post-traumatic stress disorder)    Tinea corporis 01/11/2021   UTI (urinary tract infection)    OB History  Gravida Para Term Preterm AB Living  4 2 2  1 2   SAB IAB Ectopic Multiple Live Births  1   0 2    # Outcome Date GA Lbr Len/2nd Weight Sex Type Anes PTL Lv  4 Current           3 Term 04/27/23 [redacted]w[redacted]d / 00:41 3374 g M Vag-Spont EPI  LIV  2 Term 07/13/21 [redacted]w[redacted]d 19:17 / 00:48 3790 g M Vag-Spont EPI  LIV     Birth Comments: WDL  1 SAB 08/04/19           Past Surgical History:  Procedure Laterality Date   HERNIA REPAIR  07/03/2006   HERNIA REPAIR  03/30/2022   Family History  Problem Relation Age of Onset   Heart disease Mother    Stroke Mother    Hypertension Mother    Bipolar disorder Mother    Healthy Father    Anxiety disorder Sister    Depression Sister    Hypertension Sister    Hypertension Maternal Grandmother    Lung cancer Maternal  Grandmother    Stroke Maternal Grandfather    Colon cancer Maternal Grandfather    Depression Paternal Grandmother    Hypertension Paternal Grandmother    Social History   Tobacco Use   Smoking status: Former    Current packs/day: 0.50    Types: Cigarettes    Passive exposure: Never   Smokeless tobacco: Never  Vaping Use   Vaping status: Never Used  Substance Use Topics   Alcohol use: Not Currently    Comment: occasional prior to pregnancy   Drug use: Never   No Known Allergies Medications Prior to Admission  Medication Sig Dispense Refill Last Dose/Taking   amLODipine  (NORVASC ) 10 MG tablet Take 1 tablet (10 mg total) by mouth daily. 90 tablet 0    aspirin  EC 81 MG tablet Take 1 tablet (81 mg total) by mouth daily. Swallow whole. (Patient not taking: Reported on 02/29/2024) 30 tablet 12    cetirizine  (ZYRTEC ) 10 MG tablet Take 1 tablet (10 mg total) by mouth daily. 60 tablet 6    hydrOXYzine  (VISTARIL ) 50 MG capsule Take 1 capsule (50 mg total) by mouth at bedtime. 30 capsule 6    Prenatal Vit-Fe Fumarate-FA (PRENATAL VITAMIN PO) Take by mouth daily.      sertraline  (ZOLOFT )  100 MG tablet Take 1 tablet (100 mg total) by mouth at bedtime. 30 tablet 6    I have reviewed patient's Past Medical Hx, Surgical Hx, Family Hx, Social Hx, medications and allergies.   ROS  Pertinent items noted in HPI and remainder of comprehensive ROS otherwise negative.   PHYSICAL EXAM  Patient Vitals for the past 24 hrs:  BP Temp Temp src Pulse Resp SpO2 Height Weight  03/03/24 2100 124/68 -- -- (!) 119 -- 99 % -- --  03/03/24 2046 130/72 98.1 F (36.7 C) Oral (!) 114 17 -- -- --  03/03/24 2015 (!) 145/79 -- -- 97 -- -- -- --  03/03/24 2000 (!) 164/102 -- -- 72 -- -- -- --  03/03/24 1945 (!) 174/102 -- -- 84 -- -- -- --  03/03/24 1930 (!) 171/106 -- -- 75 -- -- -- --  03/03/24 1915 (!) 159/84 -- -- 68 -- -- -- --  03/03/24 1900 (!) 156/98 -- -- 15 -- -- -- --  03/03/24 1845 (!) 143/93 -- -- 65  -- -- -- --  03/03/24 1830 (!) 153/92 -- -- 81 -- -- -- --  03/03/24 1815 (!) 149/94 -- -- 82 -- -- -- --  03/03/24 1800 (!) 140/89 -- -- 79 -- -- -- --  03/03/24 1745 (!) 144/91 -- -- 74 -- -- -- --  03/03/24 1730 (!) 141/82 -- -- 85 -- -- -- --  03/03/24 1715 (!) 147/94 -- -- 82 -- -- -- --  03/03/24 1700 (!) 142/84 -- -- 80 -- -- -- --  03/03/24 1654 (!) 152/85 -- -- 80 -- -- -- --  03/03/24 1634 133/85 98.4 F (36.9 C) -- 79 18 -- 5' 2 (1.575 m) 103.9 kg   Constitutional: Well-developed, well-nourished female in no acute distress.  Cardiovascular: normal rate, warm and well-perfused Respiratory: normal effort, no problems with respiration noted GI: Abd soft, non-tender, non-distended MS: normal ROM Neurologic: Alert and oriented x 4.  Pelvic: deferred    Fetal Tracing: Cat 2 Baseline: 120 Variability: moderate Accelerations: 15x15 Decelerations: variable Toco: difficulty with tracing, UC not endorsed by patient   Labs: Results for orders placed or performed during the hospital encounter of 03/03/24 (from the past 24 hours)  CBC     Status: None   Collection Time: 03/03/24  6:48 PM  Result Value Ref Range   WBC 9.3 4.0 - 10.5 K/uL   RBC 4.43 3.87 - 5.11 MIL/uL   Hemoglobin 12.6 12.0 - 15.0 g/dL   HCT 62.6 63.9 - 53.9 %   MCV 84.2 80.0 - 100.0 fL   MCH 28.4 26.0 - 34.0 pg   MCHC 33.8 30.0 - 36.0 g/dL   RDW 86.7 88.4 - 84.4 %   Platelets 214 150 - 400 K/uL   nRBC 0.0 0.0 - 0.2 %  Comprehensive metabolic panel     Status: Abnormal   Collection Time: 03/03/24  6:48 PM  Result Value Ref Range   Sodium 137 135 - 145 mmol/L   Potassium 4.1 3.5 - 5.1 mmol/L   Chloride 107 98 - 111 mmol/L   CO2 19 (L) 22 - 32 mmol/L   Glucose, Bld 71 70 - 99 mg/dL   BUN 9 6 - 20 mg/dL   Creatinine, Ser 9.12 0.44 - 1.00 mg/dL   Calcium 9.2 8.9 - 89.6 mg/dL   Total Protein 6.3 (L) 6.5 - 8.1 g/dL   Albumin 2.8 (L) 3.5 - 5.0 g/dL   AST 16  15 - 41 U/L   ALT 7 0 - 44 U/L   Alkaline  Phosphatase 70 38 - 126 U/L   Total Bilirubin 0.6 0.0 - 1.2 mg/dL   GFR, Estimated >39 >39 mL/min   Anion gap 11 5 - 15  Type and screen [facility]     Status: None (Preliminary result)   Collection Time: 03/03/24  8:14 PM  Result Value Ref Range   ABO/RH(D) PENDING    Antibody Screen PENDING    Sample Expiration      03/06/2024,2359 Performed at [facility], [address] [place], [place] [address]   Protein / creatinine ratio, urine     Status: None   Collection Time: 03/03/24  8:15 PM  Result Value Ref Range   Creatinine, Urine 313 mg/dL   Total Protein, Urine 35 mg/dL   Protein Creatinine Ratio 0.11 0.00 - 0.15 mg/mg[Cre]   Imaging:  No results found.  MDM & MAU COURSE  MDM: High  MAU Course: Orders Placed This Encounter  Procedures   CBC   Comprehensive metabolic panel   Protein / creatinine ratio, urine   RPR   Diet clear liquid Room service appropriate? Yes; Fluid consistency: Thin   Vital signs   Notify physician (specify) Confirmatory reading of BP> 160/110 15 minutes later   Apply Hypertensive Disorders of Pregnancy Care Plan   Measure blood pressure   Strict intake and output   Vitals signs per unit policy   Fetal monitoring per unit policy   Activity as tolerated   Cervical Exam   Fundal check post delivery every 15 min x 1 hour then every 30 min x 1 hour   Apply Labor & Delivery Care Plan   If Rapid HIV test positive or known HIV positive: initiate AZT orders   May in and out cath x 2 for inability to void   Insert urethral catheter X 1 PRN If Coude Catheter is chosen, qualified resources by campus can be found in the clinical skills nursing procedure for Coude Catheter 1. If straight catheterized > 2 times or patient unable to void post epidural plac...   Refer to Sidebar Report Urinary (Foley) Catheter Indications   Refer to Sidebar Report Post Indwelling Urinary Catheter Removal and Intervention Guidelines   Discontinue  foley prior to vaginal delivery   Initiate Oral Care Protocol   Initiate Carrier Fluid Protocol   Patient may have epidural placement upon request   Evaluate fetal heart rate to establish reassuring pattern prior to initiating Cytotec  or Pitocin    Perform a cervical exam prior to initiating Cytotec  or Pitocin    Discontinue Pitocin  if tachysystole with non-reassuring FHR is present   Initiate intrauterine resuscitation if tachysystole with non-reassuring FHR is present   May administer Terbutaline  0.25 mg SQ x 1 dose if tachysystole with non-reassuring FHR is present   Labor Induction   Full code   Type and screen [facility]   Insert and maintain IV Line   Admit to Inpatient (patient's expected length of stay will be greater than 2 midnights or inpatient only procedure)   Seizure precautions   Meds ordered this encounter  Medications   AND Linked Order Group    NIFEdipine  (PROCARDIA ) capsule 10 mg    NIFEdipine  (PROCARDIA ) capsule 20 mg    NIFEdipine  (PROCARDIA ) capsule 20 mg    labetalol  (NORMODYNE ) injection 40 mg   magnesium  bolus via infusion 4 g   magnesium  sulfate 40  grams in SWI 1000 mL OB infusion   lactated ringers  infusion   lactated ringers  infusion   oxytocin  (PITOCIN ) IV BOLUS FROM BAG   oxytocin  (PITOCIN ) IV infusion 30 units in NS 500 mL - Premix   lactated ringers  infusion 500-1,000 mL   acetaminophen  (TYLENOL ) tablet 650 mg   oxyCODONE -acetaminophen  (PERCOCET/ROXICET) 5-325 MG per tablet 1 tablet   oxyCODONE -acetaminophen  (PERCOCET/ROXICET) 5-325 MG per tablet 2 tablet   sodium phosphate (FLEET) enema 1 enema   ondansetron  (ZOFRAN ) injection 4 mg   sodium citrate-citric acid  (ORACIT) solution 30 mL   lidocaine  (PF) (XYLOCAINE ) 1 % injection 30 mL   FOLLOWED BY Linked Order Group    penicillin  G potassium 5 Million Units in sodium chloride  0.9 % 250 mL IVPB     Antibiotic Indication::   Group B Strep Prophylaxis    penicillin  G potassium 3  Million Units in dextrose  50mL IVPB     Antibiotic Indication::   Group B Strep Prophylaxis   fentaNYL  (SUBLIMAZE ) injection 50-100 mcg   hydrOXYzine  (ATARAX ) tablet 50 mg   terbutaline  (BRETHINE ) injection 0.25 mg   misoprostol  (CYTOTEC ) tablet 50 mcg   butalbital -acetaminophen -caffeine  (FIORICET ) 50-325-40 MG per tablet 2 tablet   amLODipine  (NORVASC ) tablet 10 mg   sertraline  (ZOLOFT ) tablet 100 mg   Pre-eclampsia labs ordered due to mild range elevated BP. Given home dose of Norvasc  10 mg during MAU visit yesterday at 2119. Labs are normal today. Gloris Hugger, MD and Cornell Finder, CNM to bedside to discuss delivery now due to pre-eclampsia with severe features. BP severe range following conversation regarding delivery, PO nifedipine  given. FHR Cat 2 due to variable decels that resolve with position changes. Patient and family have limited transportation and childcare, if at all possible needs to deliver with [facility]. Magnesium  infusion started.   ASSESSMENT   Pre-eclampsia with severe features 2.   36 weeks 3.   NST Reactive  PLAN   Admit to L&D for IOL. Admission cleared with NICU by Hugger, MD.   Vernell Ruddle, SNM 03/03/24 2115  Attestation of Supervision of Student:  I confirm that I have verified the information documented in the nurse midwife student's note and that I have also personally supervised the history, physical exam and all medical decision making activities.  I have verified that all services and findings are accurately documented in this student's note; and I agree with management and plan as outlined in the documentation. I have also made any necessary editorial changes.  Cornell JONELLE Finder, CNM [facility] [employer], [facility] 03/03/2024 9:55 PM

## 2024-03-03 NOTE — Anesthesia Preprocedure Evaluation (Signed)
 Anesthesia Evaluation  Patient identified by MRN, date of birth, ID band Patient confused    Reviewed: Allergy & Precautions, NPO status , Patient's Chart, lab work & pertinent test results  Airway Mallampati: III  TM Distance: >3 FB Neck ROM: Full    Dental no notable dental hx. (+) Teeth Intact, Dental Advisory Given   Pulmonary former smoker   Pulmonary exam normal breath sounds clear to auscultation       Cardiovascular hypertension (prE on Mg++), Pt. on medications Normal cardiovascular exam Rhythm:Regular Rate:Normal     Neuro/Psych  PSYCHIATRIC DISORDERS Anxiety Depression       GI/Hepatic negative GI ROS, Neg liver ROS,,,  Endo/Other  negative endocrine ROS    Renal/GU negative Renal ROS     Musculoskeletal negative musculoskeletal ROS (+)    Abdominal   Peds negative pediatric ROS (+)  Hematology Lab Results      Component                Value               Date                      WBC                      9.3                 03/03/2024                HGB                      12.6                03/03/2024                HCT                      37.3                03/03/2024                MCV                      84.2                03/03/2024                PLT                      214                 03/03/2024              Anesthesia Other Findings   Reproductive/Obstetrics (+) Pregnancy                              Anesthesia Physical Anesthesia Plan  ASA: 3  Anesthesia Plan: Epidural   Post-op Pain Management:    Induction:   PONV Risk Score and Plan:   Airway Management Planned:   Additional Equipment:   Intra-op Plan:   Post-operative Plan:   Informed Consent: I have reviewed the patients History and Physical, chart, labs and discussed the procedure including the risks, benefits and alternatives for the proposed anesthesia with the patient or  authorized representative who has indicated his/her  understanding and acceptance.       Plan Discussed with:   Anesthesia Plan Comments: (36.1 wk G4P2 w prE on Mg++ w BMI 41.1 for LEA)        Anesthesia Quick Evaluation

## 2024-03-03 NOTE — MAU Note (Signed)
 Brandi [name] is a 28 y.o. at [redacted]w[redacted]d here in MAU reporting: right eye has felt heavy all day . C/O head  pain that come suddenly and then go away for a little while and then come back  all day. Stated she just feels off. Was in MAu yesterday for PIH eval.  Good fetal movement reported. Reports some pain in URQ as well.  LMP:  Onset of complaint: 4 days Pain score: 8 Vitals:   03/03/24 1634  BP: 133/85  Pulse: 79  Resp: 18  Temp: 98.4 F (36.9 C)     FHT: 133  Lab orders placed from triage:

## 2024-03-04 ENCOUNTER — Inpatient Hospital Stay (HOSPITAL_COMMUNITY): Admitting: Anesthesiology

## 2024-03-04 ENCOUNTER — Encounter (HOSPITAL_COMMUNITY): Payer: Self-pay | Admitting: Obstetrics & Gynecology

## 2024-03-04 DIAGNOSIS — O99344 Other mental disorders complicating childbirth: Secondary | ICD-10-CM

## 2024-03-04 DIAGNOSIS — Z3A36 36 weeks gestation of pregnancy: Secondary | ICD-10-CM

## 2024-03-04 DIAGNOSIS — O4593 Premature separation of placenta, unspecified, third trimester: Secondary | ICD-10-CM

## 2024-03-04 DIAGNOSIS — O4590 Premature separation of placenta, unspecified, unspecified trimester: Secondary | ICD-10-CM | POA: Diagnosis not present

## 2024-03-04 DIAGNOSIS — O1414 Severe pre-eclampsia complicating childbirth: Secondary | ICD-10-CM

## 2024-03-04 LAB — COMPREHENSIVE METABOLIC PANEL WITH GFR
ALT: 9 U/L (ref 0–44)
ALT: 8 U/L (ref 0–44)
AST: 16 U/L (ref 15–41)
AST: 17 U/L (ref 15–41)
Albumin: 3 g/dL — ABNORMAL LOW (ref 3.5–5.0)
Albumin: 2.8 g/dL — ABNORMAL LOW (ref 3.5–5.0)
Alkaline Phosphatase: 78 U/L (ref 38–126)
Alkaline Phosphatase: 69 U/L (ref 38–126)
Anion gap: 18 — ABNORMAL HIGH (ref 5–15)
Anion gap: 11 (ref 5–15)
BUN: 8 mg/dL (ref 6–20)
BUN: 9 mg/dL (ref 6–20)
CO2: 15 mmol/L — ABNORMAL LOW (ref 22–32)
CO2: 16 mmol/L — ABNORMAL LOW (ref 22–32)
Calcium: 8.2 mg/dL — ABNORMAL LOW (ref 8.9–10.3)
Calcium: 7.6 mg/dL — ABNORMAL LOW (ref 8.9–10.3)
Chloride: 103 mmol/L (ref 98–111)
Chloride: 106 mmol/L (ref 98–111)
Creatinine, Ser: 1.01 mg/dL — ABNORMAL HIGH (ref 0.44–1.00)
Creatinine, Ser: 0.95 mg/dL (ref 0.44–1.00)
GFR, Estimated: >60 mL/min (ref >60)
GFR, Estimated: >60 mL/min (ref >60)
Glucose, Bld: 84 mg/dL (ref 70–99)
Glucose, Bld: 86 mg/dL (ref 70–99)
Potassium: 4.4 mmol/L (ref 3.5–5.1)
Potassium: 4.3 mmol/L (ref 3.5–5.1)
Sodium: 136 mmol/L (ref 135–145)
Sodium: 133 mmol/L — ABNORMAL LOW (ref 135–145)
Total Bilirubin: 0.9 mg/dL (ref 0.0–1.2)
Total Bilirubin: 0.9 mg/dL (ref 0.0–1.2)
Total Protein: 6.2 g/dL — ABNORMAL LOW (ref 6.5–8.1)
Total Protein: 5.9 g/dL — ABNORMAL LOW (ref 6.5–8.1)

## 2024-03-04 LAB — CBC
HCT: 39.9 % (ref 36.0–46.0)
HCT: 37.2 % (ref 36.0–46.0)
Hemoglobin: 13.5 g/dL (ref 12.0–15.0)
Hemoglobin: 12.7 g/dL (ref 12.0–15.0)
MCH: 28.4 pg (ref 26.0–34.0)
MCH: 29 pg (ref 26.0–34.0)
MCHC: 33.8 g/dL (ref 30.0–36.0)
MCHC: 34.1 g/dL (ref 30.0–36.0)
MCV: 83.8 fL (ref 80.0–100.0)
MCV: 84.9 fL (ref 80.0–100.0)
Platelets: 223 K/uL (ref 150–400)
Platelets: 220 K/uL (ref 150–400)
RBC: 4.76 MIL/uL (ref 3.87–5.11)
RBC: 4.38 MIL/uL (ref 3.87–5.11)
RDW: 13.2 % (ref 11.5–15.5)
RDW: 13.2 % (ref 11.5–15.5)
WBC: 11 K/uL — ABNORMAL HIGH (ref 4.0–10.5)
WBC: 11.9 K/uL — ABNORMAL HIGH (ref 4.0–10.5)
nRBC: 0 % (ref 0.0–0.2)
nRBC: 0 % (ref 0.0–0.2)

## 2024-03-04 LAB — RPR: RPR Ser Ql: NONREACTIVE

## 2024-03-04 LAB — MAGNESIUM
Magnesium: 5.8 mg/dL — ABNORMAL HIGH (ref 1.7–2.4)
Magnesium: 6 mg/dL — ABNORMAL HIGH (ref 1.7–2.4)
Magnesium: 5.7 mg/dL — ABNORMAL HIGH (ref 1.7–2.4)

## 2024-03-04 MED ORDER — IBUPROFEN 600 MG PO TABS
600.0000 mg | ORAL_TABLET | Freq: Four times a day (QID) | ORAL | Status: DC
  Administered 2024-03-04 – 2024-03-06 (×9): 600 mg via ORAL
  Filled 2024-03-04 (×9): qty 1

## 2024-03-04 MED ORDER — ACETAMINOPHEN 325 MG PO TABS
650.0000 mg | ORAL_TABLET | ORAL | Status: DC | PRN
  Administered 2024-03-04 – 2024-03-06 (×5): 650 mg via ORAL
  Filled 2024-03-04 (×5): qty 2

## 2024-03-04 MED ORDER — OXYCODONE HCL 5 MG PO TABS
5.0000 mg | ORAL_TABLET | ORAL | Status: DC | PRN
  Administered 2024-03-04 – 2024-03-06 (×4): 5 mg via ORAL
  Filled 2024-03-04 (×4): qty 1

## 2024-03-04 MED ORDER — ONDANSETRON HCL 4 MG PO TABS: 4.0000 mg | ORAL_TABLET | ORAL | Status: DC | PRN

## 2024-03-04 MED ORDER — SENNOSIDES-DOCUSATE SODIUM 8.6-50 MG PO TABS
2.0000 | ORAL_TABLET | ORAL | Status: DC
  Administered 2024-03-05 – 2024-03-06 (×2): 2 via ORAL
  Filled 2024-03-04 (×3): qty 2

## 2024-03-04 MED ORDER — DIBUCAINE (PERIANAL) 1 % EX OINT: 1.0000 | TOPICAL_OINTMENT | CUTANEOUS | Status: DC | PRN

## 2024-03-04 MED ORDER — TETANUS-DIPHTH-ACELL PERTUSSIS 5-2.5-18.5 LF-MCG/0.5 IM SUSY: 0.5000 mL | PREFILLED_SYRINGE | Freq: Once | INTRAMUSCULAR | Status: DC

## 2024-03-04 MED ORDER — PRENATAL MULTIVITAMIN CH
1.0000 | ORAL_TABLET | Freq: Every day | ORAL | Status: DC
  Administered 2024-03-04 – 2024-03-06 (×3): 1 via ORAL
  Filled 2024-03-04 (×3): qty 1

## 2024-03-04 MED ORDER — MEASLES, MUMPS & RUBELLA VAC IJ SOLR: 0.5000 mL | Freq: Once | INTRAMUSCULAR | Status: DC

## 2024-03-04 MED ORDER — FUROSEMIDE 20 MG PO TABS
20.0000 mg | ORAL_TABLET | Freq: Every day | ORAL | Status: DC
  Administered 2024-03-04 – 2024-03-06 (×3): 20 mg via ORAL
  Filled 2024-03-04 (×3): qty 1

## 2024-03-04 MED ORDER — WITCH HAZEL-GLYCERIN EX PADS: 1.0000 | MEDICATED_PAD | CUTANEOUS | Status: DC | PRN

## 2024-03-04 MED ORDER — BUTORPHANOL TARTRATE 1 MG/ML IJ SOLN
1.0000 mg | INTRAMUSCULAR | Status: DC | PRN
  Administered 2024-03-04: 1 mg via INTRAVENOUS
  Filled 2024-03-04 (×2): qty 1

## 2024-03-04 MED ORDER — BENZOCAINE-MENTHOL 20-0.5 % EX AERO
1.0000 | INHALATION_SPRAY | CUTANEOUS | Status: DC | PRN
  Administered 2024-03-04: 1 via TOPICAL
  Filled 2024-03-04: qty 56

## 2024-03-04 MED ORDER — ONDANSETRON HCL 4 MG/2ML IJ SOLN: 4.0000 mg | INTRAMUSCULAR | Status: DC | PRN

## 2024-03-04 MED ORDER — LIDOCAINE HCL (PF) 1 % IJ SOLN
INTRAMUSCULAR | Status: DC | PRN
  Administered 2024-03-04: 5 mL via EPIDURAL

## 2024-03-04 MED ORDER — SIMETHICONE 80 MG PO CHEW: 80.0000 mg | CHEWABLE_TABLET | ORAL | Status: DC | PRN

## 2024-03-04 MED ORDER — MAGNESIUM SULFATE 40 GM/1000ML IV SOLN
1.0000 g/h | INTRAVENOUS | Status: AC
  Administered 2024-03-04: 1 g/h via INTRAVENOUS
  Filled 2024-03-04: qty 1000

## 2024-03-04 MED ORDER — ZOLPIDEM TARTRATE 5 MG PO TABS: 5.0000 mg | ORAL_TABLET | Freq: Every evening | ORAL | Status: DC | PRN

## 2024-03-04 MED ORDER — DIPHENHYDRAMINE HCL 25 MG PO CAPS: 25.0000 mg | ORAL_CAPSULE | Freq: Four times a day (QID) | ORAL | Status: DC | PRN

## 2024-03-04 MED ORDER — POTASSIUM CHLORIDE CRYS ER 20 MEQ PO TBCR
20.0000 meq | EXTENDED_RELEASE_TABLET | Freq: Every day | ORAL | Status: DC
  Administered 2024-03-04 – 2024-03-06 (×3): 20 meq via ORAL
  Filled 2024-03-04 (×3): qty 1

## 2024-03-04 MED ORDER — COCONUT OIL OIL: 1.0000 | TOPICAL_OIL | Status: DC | PRN

## 2024-03-04 NOTE — Progress Notes (Signed)
   03/04/24 0555  Vital Signs  Resp 19  Fetal Heart Rate A  Mode External  Baseline Rate (A) 120 bpm  Variability 6-25 BPM;<5 BPM  Accelerations 15 x 15  Decelerations None  Uterine Activity  Mode Toco  Contraction Frequency (min) 2-3  Contraction Duration (sec) 40-90  Contraction Quality Moderate  Resting Tone Palpated Relaxed  Resting Time Adequate  OB Interventions  Interventions Position changed  Position Right lateral;With peanut  Pain Assessment  Pain Location Rectum  Pain Descriptors / Indicators Pressure   Pt c/o of rectal pressure, wants to wait for her husband before she gets checked or AROM.

## 2024-03-04 NOTE — Anesthesia Postprocedure Evaluation (Signed)
 Anesthesia Post Note  Patient: Brandi [name], Brandi [name]  Procedure(s) Performed: AN AD HOC LABOR EPIDURAL     Patient location during evaluation: Mother Baby Anesthesia Type: Epidural Level of consciousness: awake Pain management: satisfactory to patient Vital Signs Assessment: post-procedure vital signs reviewed and stable Respiratory status: spontaneous breathing Cardiovascular status: stable Anesthetic complications: no   No notable events documented.  Last Vitals:  Vitals:   03/04/24 1509 03/04/24 1540  BP:  130/81  Pulse:  86  Resp: 17 17  Temp:  36.8 C  SpO2: 100% 100%    Last Pain:  Vitals:   03/04/24 1540  TempSrc: Oral  PainSc:    Pain Goal:                   [employer]

## 2024-03-04 NOTE — Anesthesia Procedure Notes (Signed)
 Epidural Patient location during procedure: OB Start time: 03/04/2024 12:04 AM End time: 03/04/2024 12:20 AM  Staffing Anesthesiologist: Jefm Garnette LABOR, MD Performed: anesthesiologist   Preanesthetic Checklist Completed: patient identified, IV checked, site marked, risks and benefits discussed, surgical consent, monitors and equipment checked, pre-op evaluation and timeout performed  Epidural Patient position: sitting Prep: DuraPrep and site prepped and draped Patient monitoring: continuous pulse ox and blood pressure Approach: midline Location: L3-L4 Injection technique: LOR air  Needle:  Needle type: Tuohy  Needle gauge: 17 G Needle length: 9 cm and 9 Needle insertion depth: 8 cm Catheter type: closed end flexible Catheter size: 19 Gauge Catheter at skin depth: 15 cm Test dose: negative  Assessment Events: blood not aspirated, no cerebrospinal fluid, injection not painful, no injection resistance, no paresthesia and negative IV test  Additional Notes Patient identified. Risks/Benefits/Options discussed with patient including but not limited to bleeding, infection, nerve damage, paralysis, failed block, incomplete pain control, headache, blood pressure changes, nausea, vomiting, reactions to medication both or allergic, itching and postpartum back pain. Confirmed with bedside nurse the patient's most recent platelet count. Confirmed with patient that they are not currently taking any anticoagulation, have any bleeding history or any family history of bleeding disorders. Patient expressed understanding and wished to proceed. All questions were answered. Sterile technique was used throughout the entire procedure. Please see nursing notes for vital signs. Test dose was given through epidural needle and negative prior to continuing to dose epidural or start infusion. Warning signs of high block given to the patient including shortness of breath, tingling/numbness in hands, complete  motor block, or any concerning symptoms with instructions to call for help. Patient was given instructions on fall risk and not to get out of bed. All questions and concerns addressed with instructions to call with any issues.  1Attempt (S) . Patient tolerated procedure well.

## 2024-03-04 NOTE — Lactation Note (Signed)
 This note was copied from a baby's chart. Lactation Consultation Note  Patient Name: Brandi [name] Date: 03/04/2024 Age:29 hours   Visited with family of 47 31/15 weeks old female; Ms. Greenley is a P3 and reported that she'll be doing only formula, her H&P said breastfeeding but she confirmed, she won't be pumping or putting baby to breast. Lactation services are completed at this time.  Egbert Seidel S Stryker Veasey 03/04/2024, 5:11 PM

## 2024-03-04 NOTE — Progress Notes (Signed)
 Patient ID: Brandi [name], female   DOB: 04/17/1996, 28 y.o.   MRN: [id number]  Rec'd Tylenol  recently for H/A; Fioricet  only worked a little earlier; mostly comfortable w epidural in place; mag sulfate infusing; s/p dual cytotec  at 2200  BPs 139/86, 148/92 FHR 115-120s, +accels, min-mod variability Ctx q 2 mins Cx per RN at 0400 6/70/vtx -2  IUP@36 .2wks Severe pre-e Early active labor  -Will try Stadol  for H/A -She elects to leave membranes intact for now; trying to allow for husband to be present for birth after he gets the other children to childcare -Anticipate vag delivery  Suzen JONETTA Gentry CNM 03/04/2024 4:19 AM

## 2024-03-04 NOTE — Discharge Summary (Signed)
 Postpartum Discharge Summary  Date of Service updated***     Patient Name: Brandi [name] DOB: 10-Jul-1995 MRN: [id number]  Date of admission: 03/03/2024 Delivery date:03/04/2024 Delivering provider: LORELI IHA D Date of discharge: 03/04/2024  Admitting diagnosis: Indication for care in labor or delivery [O75.9] Intrauterine pregnancy: [redacted]w[redacted]d     Secondary diagnosis:  Principal Problem:   Indication for care in labor or delivery Active Problems:   MDD (major depressive disorder), recurrent severe, without psychosis (HCC)   Chronic hypertension with superimposed pre-eclampsia  Additional problems: none    Discharge diagnosis: Preterm Pregnancy Delivered and CHTN with superimposed preeclampsia                                              Post partum procedures:{Postpartum procedures:23558} Augmentation: AROM and Cytotec  Complications: Placental Abruption  Hospital course: Induction of Labor With Vaginal Delivery   28 y.o. yo [phone] at [redacted]w[redacted]d was admitted to the hospital 03/03/2024 for induction of labor.  Indication for induction: cHTN w severe SIPE.  Patient had a labor course complicated by receiving mag sulfate during labor; placental abruption noted approx 10 mins prior to delivery. Membrane Rupture Time/Date: 7:24 AM,03/04/2024  Delivery Method:Vaginal, Spontaneous Operative Delivery:N/A Episiotomy: None Lacerations:  None Details of delivery can be found in separate delivery note.  Patient had a postpartum course complicated by***. Patient is discharged home 03/04/24.  Newborn Data: Birth date:03/04/2024 Birth time:7:29 AM Gender:Female Living status:Living Apgars:7 ,9  Weight:2610 g (5lb 12.1oz)  Magnesium  Sulfate received: {Mag received:30440022} BMZ received: {BMZ received:30440023} Rhophylac:{Rhophylac received:30440032} FFM:{FFM:69559966} T-DaP:{Tdap:23962} Flu: {Qol:76036} RSV Vaccine received: {RSV:31013} Transfusion:{Transfusion  received:30440034}  Immunizations received: Immunization History  Administered Date(s) Administered   Tdap 06/22/2021, 04/04/2023    Physical exam  Vitals:   03/04/24 0731 03/04/24 0745 03/04/24 0800 03/04/24 0815  BP: (!) 137/97 134/75 (!) 139/90 124/89  Pulse: (!) 113 91 96 86  Resp:      Temp:      TempSrc:      SpO2:      Weight:      Height:       General: {Exam; general:21111117} Lochia: {Desc; appropriate/inappropriate:30686::appropriate} Uterine Fundus: {Desc; firm/soft:30687} Incision: {Exam; incision:21111123} DVT Evaluation: {Exam; dvt:2111122} Labs: Lab Results  Component Value Date   WBC 11.0 (H) 03/04/2024   HGB 13.5 03/04/2024   HCT 39.9 03/04/2024   MCV 83.8 03/04/2024   PLT 223 03/04/2024      Latest Ref Rng & Units 03/04/2024    6:42 AM  CMP  Glucose 70 - 99 mg/dL 84   BUN 6 - 20 mg/dL 8   Creatinine 9.55 - 8.99 mg/dL 8.98   Sodium 864 - 854 mmol/L 136   Potassium 3.5 - 5.1 mmol/L 4.4   Chloride 98 - 111 mmol/L 103   CO2 22 - 32 mmol/L 15   Calcium 8.9 - 10.3 mg/dL 8.2   Total Protein 6.5 - 8.1 g/dL 6.2   Total Bilirubin 0.0 - 1.2 mg/dL 0.9   Alkaline Phos 38 - 126 U/L 78   AST 15 - 41 U/L 16   ALT 0 - 44 U/L 9    Edinburgh Score:    06/13/2023    6:01 PM  Edinburgh Postnatal Depression Scale Screening Tool  I have been able to laugh and see the funny side of things. 0  I have looked forward  with enjoyment to things. 0  I have blamed myself unnecessarily when things went wrong. 0  I have been anxious or worried for no good reason. 0  I have felt scared or panicky for no good reason. 0  Things have been getting on top of me. 2  I have been so unhappy that I have had difficulty sleeping. 2  I have felt sad or miserable. 2  I have been so unhappy that I have been crying. 2  The thought of harming myself has occurred to me. 0  Edinburgh Postnatal Depression Scale Total 8      Data saved with a previous flowsheet row definition   No  data recorded  After visit meds:  Allergies as of 03/04/2024   No Known Allergies   Med Rec must be completed prior to using this [employer]***        Discharge home in stable condition Infant Feeding: {Baby feeding:23562} Infant Disposition:{CHL IP OB HOME WITH FNUYZM:76418} Discharge instruction: per After Visit Summary and Postpartum booklet. Activity: Advance as tolerated. Pelvic rest for 6 weeks.  Diet: routine diet Future Appointments: Future Appointments  Date Time Provider Department Center  03/07/2024 10:20 AM Sheena Pugh, DO [facility]  03/12/2024  4:15 PM Vannie Cornell SAUNDERS, CNM [facility]  03/14/2024  1:00 PM [facility] NURSE [facility]  03/14/2024  1:15 PM [facility] NST [facility]  03/19/2024  4:15 PM Vannie Cornell SAUNDERS EDDY [facility]  03/21/2024  1:00 PM [facility] NURSE [facility]  03/21/2024  1:15 PM [facility] NST [facility]  03/26/2024  4:15 PM Vannie Cornell SAUNDERS EDDY [facility]  04/02/2024  4:15 PM Vannie Cornell SAUNDERS, CNM [facility]   Follow up Visit:  Loreli Suzen BIRCH, CNM  P [facility] Admin Pool Please schedule this patient for Postpartum visit in: 6 weeks with the following provider: Any provider In-Person For C/S patients schedule nurse incision check in weeks 2 weeks: no High risk pregnancy complicated by: cHTN w superimposed pre-e Delivery mode:  SVD Anticipated Birth Control:  NFP PP Procedures needed: RN BP check in 1wk; routine Pap Schedule Integrated BH visit: no   03/04/2024 Suzen BIRCH Loreli, CNM

## 2024-03-05 MED ORDER — LACTATED RINGERS IV SOLN: INTRAVENOUS | Status: AC

## 2024-03-05 MED ORDER — LACTATED RINGERS IV SOLN: INTRAVENOUS | Status: DC

## 2024-03-05 NOTE — Clinical Social Work Maternal (Signed)
 CLINICAL SOCIAL WORK MATERNAL/CHILD NOTE  Patient Details  Name: Brandi [name] MRN: [id number] Date of Birth: 11/20/1995  Date:  10-20-23  Clinical Social Worker Initiating Note:  Nat Qu[place], [place] Date/Time: Initiated:  03/05/24/1414     Child's Name:  Brandi [name]   Biological Parents:  Mother, Father (Mother: Brandi [name] 07/20/1994 FOB: Ollis [name] 05/31/1993)   Need for Interpreter:  None   Reason for Referral:  Other (Comment), Behavioral Health Concerns (Car seat)   Address:  [address] [place] [address]    Phone number:  [phone] (home)     Additional phone number:   Household Members/Support Persons (HM/SP):   Household Member/Support Person 1, Household Member/Support Person 2, Household Member/Support Person 3, Household Member/Support Person 4   HM/SP Name Relationship DOB or Age  HM/SP -1 Samir Kozar Spouse 05/31/1993  HM/SP -2 Rutherford Hafford Son 07/13/2021  HM/SP -3 Makaio Bellefeuille Son 04/27/2023  HM/SP -4 Jamiah Gaskins Sister 28  HM/SP -5        HM/SP -6        HM/SP -7        HM/SP -8          Natural Supports (not living in the home):      Professional Supports: Therapist   Employment: Unemployed   Type of Work:     Education:  Counsellor arranged:    Surveyor, quantity Resources:  Medicaid   Other Resources:      Cultural/Religious Considerations Which May Impact Care:    Strengths:  Ability to meet basic needs  , Home prepared for child  , Merchandiser, retail, Psychotropic Medications   Psychotropic Medications:  Zoloft      Pediatrician:    Armed forces operational officer area  Pediatrician List:   [facility]  [place]    [place]    [place] [facility]      Pediatrician Fax Number:    Risk Factors/Current Problems:  Basic Needs     Cognitive State:  Able to Concentrate     Mood/Affect:   Calm  , Comfortable  , Interested     CSW Assessment: CSW received consult for hx of Anxiety, Depression and a car seat. CSW met with MOB to offer support and complete assessment.   CSW met with MOB at bedside and introduced CSW role. MOB appeared calm, pleasant and engaged with CSW throughout the assessment. FOB was up in the room offering MOB support. CSW offered MOB privacy, and she gave CSW permission to share all information with FOB present. CSW inquired if the demographic information on hospital file was correct. MOB provided her current address, [address] [place], [place] [address] and confirmed her contact number was correct.  CSW inquired about MOB's household situation. MOB reported that she lives with FOB, her two sons and her sister. She reported that she is currently unemployed, and FOB works at [employer]. Both parents reported that they receive WIC/SNAP benefits. CSW inquired if MOB had essential items to care for their infant. MOB confirmed that she had a crib and other essential items for their infant. However, she would need assistance with a car seat. CSW informed parents that the hospital car seat program does not currently have car seats available. MOB shared their plan is to use their ten-month-old son's infant car seat at discharge. CSW encouraged MOB to reach out to Medicaid (  [employer]) to inquire about a car seat. MOB contacted Medicaid [employer] while CSW was present, and her call was placed on hold. MOB reported that she would attempt to call at a later time. CSW inquired if MOB had chosen a pediatrician for the infant. MOB shared they will continue to use [facility] despite their relocation to [place], [place].   CSW asked MOB how she had been feeling since giving birth. MOB reported that she was feeling good and was adjusting to her older son, adjusting to their newborn. CSW inquired about MOB's noted mental health history. MOB expressed that she has a  history of anxiety and depression. She reported during the pregnancy she had both depression and anxiety symptoms which she continues to treat with Zoloft and Hydroxyzine. MOB expressed that the medication has been beneficial, and that she will continue to take the medication during the postpartum period since she had experienced postpartum depression in the past.  CSW provided education regarding the baby blues period vs. perinatal mood disorders, discussed treatment and gave resources for mental health follow up if concerns arise.  CSW recommended MOB complete self-evaluation during the postpartum time period using the New Mom Checklist from Postpartum Progress and encouraged MOB to contact a medical professional if symptoms are noted at any time.  CSW inquired about MOB's support system. MOB identified her spouse, sister and children as supports. CSW encouraged MOB to utilize her support system. CSW assessed MOB for safety. MOB denied SI/HI.  CSW provided review of Sudden Infant Death Syndrome (SIDS) precautions.  MOB verbalized understanding. CSW assessed MOB for additional needs. MOB reported none.  CSW identifies no further need for intervention and no barriers to discharge at this time. CSW Plan/Description:  Perinatal Mood and Anxiety Disorder (PMADs) Education, Sudden Infant Death Syndrome (SIDS) Education, No Further Intervention Required/No Barriers to Discharge, Other Information/Referral to [employer], LCSW 11-28-23, 2:30 PM

## 2024-03-05 NOTE — Progress Notes (Signed)
 Post Partum Day 1 Subjective: Patient is doing well without complaints. She denies HA, visual changes, RUQ/epigastric pain. Patient has ambulated to the bathroom. She denies feeling dizzy or lightheaded. Patient is voiding and has tolerated a regular diet  Objective: Blood pressure 121/74, pulse 82, temperature 98.2 F (36.8 C), temperature source Oral, resp. rate 16, height 5' 2 (1.575 m), weight 103.9 kg, last menstrual period 06/24/2023, SpO2 99%, unknown if currently breastfeeding.  Physical Exam:  General: alert, cooperative, and no distress Lochia: appropriate Uterine Fundus: firm DVT Evaluation: No evidence of DVT seen on physical exam.  Recent Labs    03/04/24 0642 03/04/24 0810  HGB 13.5 12.7  HCT 39.9 37.2    Assessment/Plan: 28 yo PPD#1 s/p SVD with CHTN and superimposed preeclampsia - Patient to complete magnesium  sulfate for seizure prophylaxis this morning - BP currently stable on Norvasc  and Lasix  - Continue current postpartum care and monitoring BP   LOS: 2 days   Winton Felt, MD 03/05/2024, 8:26 AM

## 2024-03-06 ENCOUNTER — Ambulatory Visit

## 2024-03-06 ENCOUNTER — Other Ambulatory Visit (HOSPITAL_COMMUNITY): Payer: Self-pay

## 2024-03-06 LAB — SURGICAL PATHOLOGY

## 2024-03-06 MED ORDER — FUROSEMIDE 20 MG PO TABS
20.0000 mg | ORAL_TABLET | Freq: Every day | ORAL | 0 refills | Status: DC
  Filled 2024-03-06: qty 3, 3d supply, fill #0

## 2024-03-06 MED ORDER — IBUPROFEN 600 MG PO TABS
600.0000 mg | ORAL_TABLET | Freq: Four times a day (QID) | ORAL | 0 refills | Status: AC
  Filled 2024-03-06: qty 30, 8d supply, fill #0

## 2024-03-06 MED ORDER — POTASSIUM CHLORIDE CRYS ER 20 MEQ PO TBCR
20.0000 meq | EXTENDED_RELEASE_TABLET | Freq: Every day | ORAL | 0 refills | Status: DC
  Filled 2024-03-06: qty 3, 3d supply, fill #0

## 2024-03-06 NOTE — Progress Notes (Signed)
 Mother desires newborn circumcision. Circumcision procedure details discussed, risks and benefits of procedure were also discussed.  These include but are not limited to: Benefits of circumcision in men include reduction in the rates of urinary tract infection (UTI), penile cancer, some sexually transmitted infections, penile inflammatory and retractile disorders, as well as easier hygiene.  Risks include bleeding , infection, injury of glans which may lead to penile deformity or urinary tract issues, unsatisfactory cosmetic appearance and other potential complications related to the procedure.  It was emphasized that this is an elective procedure.  Patient wanted to proceed with circumcision; written informed consent obtained.

## 2024-03-07 ENCOUNTER — Ambulatory Visit: Attending: Cardiology | Admitting: Cardiology

## 2024-03-12 ENCOUNTER — Encounter: Admitting: Certified Nurse Midwife

## 2024-03-14 ENCOUNTER — Other Ambulatory Visit

## 2024-03-14 ENCOUNTER — Ambulatory Visit

## 2024-03-17 ENCOUNTER — Telehealth (HOSPITAL_COMMUNITY): Payer: Self-pay | Admitting: *Deleted

## 2024-03-17 NOTE — Telephone Encounter (Signed)
 03/17/2024  Name: Brandi [name] MRN: [id number] DOB: 1995/09/30  Reason for Call:  Transition of Care Hospital Discharge Call  Contact Status: Patient Contact Status: Message  Language assistant needed:          Follow-Up Questions:    Van Postnatal Depression Scale:  In the Past 7 Days:    PHQ2-9 Depression Scale:     Discharge Follow-up:    Post-discharge interventions: NA  Mliss Sieve, RN 03/17/2024 11:04

## 2024-03-19 ENCOUNTER — Encounter: Admitting: Certified Nurse Midwife

## 2024-03-21 ENCOUNTER — Ambulatory Visit

## 2024-03-21 ENCOUNTER — Other Ambulatory Visit

## 2024-03-26 ENCOUNTER — Encounter: Admitting: Certified Nurse Midwife

## 2024-04-02 ENCOUNTER — Encounter: Admitting: Certified Nurse Midwife

## 2024-04-09 ENCOUNTER — Telehealth: Admitting: Certified Nurse Midwife

## 2024-04-09 DIAGNOSIS — F411 Generalized anxiety disorder: Secondary | ICD-10-CM | POA: Diagnosis not present

## 2024-04-09 DIAGNOSIS — I10 Essential (primary) hypertension: Secondary | ICD-10-CM

## 2024-04-09 DIAGNOSIS — N393 Stress incontinence (female) (male): Secondary | ICD-10-CM | POA: Diagnosis not present

## 2024-04-09 DIAGNOSIS — F5083 Pica in adults: Secondary | ICD-10-CM

## 2024-04-09 DIAGNOSIS — K439 Ventral hernia without obstruction or gangrene: Secondary | ICD-10-CM

## 2024-04-09 NOTE — Progress Notes (Unsigned)
 Provider location: Center for [facility]   Patient location: Home  I connected withNAME@ on 04/10/24 at 10:35 AM EDT by Mychart Video Encounter and verified that I am speaking with the correct person using two identifiers.       I discussed the limitations, risks, security and privacy concerns of performing an evaluation and management service virtually and the availability of in person appointments. I also discussed with the patient that there may be a patient responsible charge related to this service. The patient expressed understanding and agreed to proceed.  Post Partum Visit Note Subjective:   Brandi [name] is a 28 y.o. [phone] female who presents for a postpartum visit. She is 4 weeks postpartum following a normal spontaneous vaginal delivery.  I have fully reviewed the prenatal and intrapartum course. The delivery was at 36 gestational weeks.  Anesthesia: epidural. Postpartum course has been uncomplicated. Baby is doing well but has a cold (along with the whole family) today. Baby is feeding by bottle - [employer]. Bleeding staining only. Bowel function is normal. Bladder function is abnormal: stress incontinence. Patient is not sexually active. Contraception method is condoms. Postpartum depression screening: positive - a little hyperalert/reactive, meds are helping. Both are very much dependent on her environmental stressors (at home with kids all day, no assistance, can't leave the house very often). Has good insight into feelings and increasing her coping skills.  Also reports new onset of pica - has intense cravings for cornstarch. Last time she felt this way her vitamin D  was low so she has started taking it again.   Would like follow up for her anterior wall hernia.  The following portions of the patient's history were reviewed and updated as appropriate: allergies, current medications, past family history, past medical history, past  social history, past surgical history, and problem list.  Review of Systems Pertinent items noted in HPI and remainder of comprehensive ROS otherwise negative.  Objective:  LMP 06/24/2023 (Approximate)    BP cuff out of batteries but reports BP has been normal. General:  Alert, oriented and cooperative. Patient is in no acute distress.  Respiratory: Normal respiratory effort, no problems with respiration noted  Mental Status: Normal mood and affect. Normal behavior. Normal judgment and thought content.  Rest of physical exam deferred due to type of encounter    Assessment:  1. Postpartum care and examination (Primary) - Recovering well physically, aside from cold symptoms today  2. Chronic hypertension - amLODipine  (NORVASC ) 10 MG tablet; Take 1 tablet (10 mg total) by mouth daily.  Dispense: 90 tablet; Refill: 3  3. Generalized anxiety disorder - Ambulatory referral to Integrated Behavioral Health - sertraline  (ZOLOFT ) 100 MG tablet; Take 1 tablet (100 mg total) by mouth at bedtime.  Dispense: 30 tablet; Refill: 11  4. Stress incontinence - Ambulatory referral to Physical Therapy  5. Hernia of anterior abdominal wall - Referred back to surgeon who repaired her hernia last time  6. Pica in adults - Vitamin D  (25 hydroxy); Future - Vitamin B12; Future - Folate; Future - Iron and TIBC; Future - Ferritin; Future   Plan:  Essential components of care per ACOG recommendations:  1.  Mood and well being: Patient with positive depression screening today. Reviewed local resources for support.  - Patient does not use tobacco.  - hx of drug use? No    2. Infant care and feeding:  -Patient currently breastmilk feeding? No  -Social determinants of health (SDOH)  reviewed in EPIC. Has transportation needs, is at risk for depression and social connection issues.  3. Sexuality, contraception and birth spacing - Patient does not want a pregnancy in the next year.  Desired family size is  3 children.  - Reviewed forms of contraception in tiered fashion. Patient desired condoms today.   - Discussed birth spacing of 18 months  4. Sleep and fatigue -Encouraged family/partner/community support of 4 hrs of uninterrupted sleep to help with mood and fatigue  5. Physical Recovery  - Discussed patients delivery and complications - Patient had no laceration, perineal healing reviewed. Patient expressed understanding - Patient has urinary incontinence? Yes Patient was referred to pelvic floor PT  - Patient is safe to resume physical and sexual activity  6.  Health Maintenance - Last pap smear done 12/22/20 and was normal with negative HPV. - Mammogram not indicated  7. Chronic Disease - PCP follow up as needed  I provided 20 minutes of face-to-face time during this encounter.  Return in about 1 month (around 05/10/2024), or as needed, for ANN.  Future Appointments  Date Time Provider Department Center  05/09/2024 10:15 AM Helmus, Proctor, PT [facility] None    [place]ell JONELLE Finder, CNM Center for [employer], [facility]

## 2024-04-10 MED ORDER — AMLODIPINE BESYLATE 10 MG PO TABS: 10.0000 mg | ORAL_TABLET | Freq: Every day | ORAL | 3 refills | Status: AC

## 2024-04-10 MED ORDER — SERTRALINE HCL 100 MG PO TABS: 100.0000 mg | ORAL_TABLET | Freq: Every day | ORAL | 11 refills | Status: DC

## 2024-04-28 ENCOUNTER — Other Ambulatory Visit: Payer: Self-pay | Admitting: Lactation Services

## 2024-04-28 DIAGNOSIS — F411 Generalized anxiety disorder: Secondary | ICD-10-CM

## 2024-04-28 NOTE — Telephone Encounter (Signed)
 Received request from [employer] to prescribe Zoloft  in 90 day supply. Will route to provider for advisement.

## 2024-04-29 ENCOUNTER — Other Ambulatory Visit: Payer: Self-pay | Admitting: General Surgery

## 2024-04-29 DIAGNOSIS — K432 Incisional hernia without obstruction or gangrene: Secondary | ICD-10-CM

## 2024-04-30 MED ORDER — SERTRALINE HCL 100 MG PO TABS: 100.0000 mg | ORAL_TABLET | Freq: Every day | ORAL | 3 refills | Status: AC

## 2024-05-08 NOTE — Therapy (Incomplete)
 OUTPATIENT PHYSICAL THERAPY FEMALE PELVIC EVALUATION   Patient Name: Brandi [name] MRN: [id number] DOB:1995/11/03, 28 y.o., female Today's Date: 05/08/2024  END OF SESSION:   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   COVID-19 virus infection 07/13/2021   Depression    HSV-1 infection    Obesity affecting pregnancy 03/20/2023   PCOS (polycystic ovarian syndrome) 09/21/2022   Preeclampsia 07/13/2021   PTSD (post-traumatic stress disorder)    Tinea corporis 01/11/2021   UTI (urinary tract infection)    Past Surgical History:  Procedure Laterality Date   HERNIA REPAIR  07/03/2006   HERNIA REPAIR  03/30/2022   Patient Active Problem List   Diagnosis Date Noted   BMI 35.0-35.9,adult 07/31/2023   MDD (major depressive disorder), recurrent severe, without psychosis (HCC) 06/17/2023   Chronic hypertension 02/06/2023   Generalized anxiety disorder 06/02/2022   Migraine 06/02/2022   Diastasis of rectus abdominis 11/15/2021   Hernia of anterior abdominal wall 11/15/2021    PCP: ***  REFERRING PROVIDER: Vannie Cornell SAUNDERS, CNM  REFERRING DIAG: N39.3 (ICD-10-CM) - Stress incontinence  THERAPY DIAG:  No diagnosis found.  Rationale for Evaluation and Treatment: Rehabilitation  ONSET DATE: ***  SUBJECTIVE:                                                                                                                                                                                           SUBJECTIVE STATEMENT: *** Fluid intake:   FUNCTIONAL LIMITATIONS: ***  PERTINENT HISTORY:  Medications for current condition: *** Surgeries: *** Other: *** Sexual abuse: {Yes/No:304960894}  PAIN:  Are you having pain? {yes/no:20286} NPRS scale: ***/10 Pain location: {pelvic pain location:27098}  Pain type: {type:313116} Pain description: {PAIN DESCRIPTION:21022940}   Aggravating factors: *** Relieving factors:  ***  PRECAUTIONS: {Therapy precautions:24002}  RED FLAGS: {PT Red Flags:29287}   WEIGHT BEARING RESTRICTIONS: {Yes ***/No:24003}  FALLS:  Has patient fallen in last 6 months? {fallsyesno:27318}  OCCUPATION: ***  ACTIVITY LEVEL : ***  PLOF: {PLOF:24004}  PATIENT GOALS: ***   BOWEL MOVEMENT: Pain with bowel movement: {yes/no:20286} Type of bowel movement:{PT BM type:27100} Fully empty rectum: {No/Yes:304960894} Leakage: {Yes/No:304960894}                                                  Caused by: *** Bowel urgency: *** Pads: {Yes/No:304960894} Fiber supplement/laxative {YES/NO AS:20300}  URINATION: Pain with urination: {yes/no:20286} Fully empty bladder: {Yes/No:304960894}***  Post-void dribble: {YES/NO AS:20300} Stream: {PT urination:27102} Urgency: {YES/NO AS:20300} Frequency:during the day ***                                                        Nocturia: {Yes/No:304960894}***   Leakage: {PT leakage:27103} Pads/briefs: {Yes/No:304960894}  INTERCOURSE:  Ability to have vaginal penetration {YES/NO:21197} Pain with intercourse: {pain with intercourse PA:27099} Dryness: {YES/NO AS:20300} Climax: *** Marinoff Scale: ***/3 Lubricant:  PREGNANCY: Vaginal deliveries *** Tearing {Yes***/No:304960894} Episiotomy {YES/NO AS:20300} C-section deliveries *** Currently pregnant {Yes***/No:304960894}  PROLAPSE: {PT prolapse:27101}   OBJECTIVE:  Note: Objective measures were completed at Evaluation unless otherwise noted.  DIAGNOSTIC FINDINGS:  Post-void residual: Voiding Cystourethrogram (VCUG):  Ultrasound: ***  PATIENT SURVEYS:  {rehab surveys:24030}  PFIQ-7: *** UIQ-7 *** CRAIG -7 *** POPIQ-7 *** Female Sexual Function Index (FSFI) Questionnaire ***  COGNITION: Overall cognitive status: {cognition:24006}     SENSATION: Light touch: {intact/deficits:24005}  LUMBAR SPECIAL TESTS:  {lumbar special  test:25242}  FUNCTIONAL TESTS:  {Functional tests:24029} Single leg stance:  Rt:  Lt: Sit-up test: Squat: Bed mobility:  GAIT: Assistive device utilized: {Assistive devices:23999} Comments: ***  POSTURE: {posture:25561}   LUMBARAROM/PROM:  A/PROM A/PROM  Eval (% available)  Flexion   Extension   Right lateral flexion   Left lateral flexion   Right rotation   Left rotation    (Blank rows = not tested)  LOWER EXTREMITY ROM:  {AROM/PROM:27142} ROM Right eval Left eval  Hip flexion    Hip extension    Hip abduction    Hip adduction    Hip internal rotation    Hip external rotation    Knee flexion    Knee extension    Ankle dorsiflexion    Ankle plantarflexion    Ankle inversion    Ankle eversion     (Blank rows = not tested)  LOWER EXTREMITY MMT:  MMT Right eval Left eval  Hip flexion    Hip extension    Hip abduction    Hip adduction    Hip internal rotation    Hip external rotation    Knee flexion    Knee extension    Ankle dorsiflexion    Ankle plantarflexion    Ankle inversion    Ankle eversion     (Blank rows = not tested) PALPATION:  General: ***  Pelvic Alignment: ***  Abdominal: ***  Diastasis: {Yes/No:304960894}*** Distortion: {YES/NO AS:20300}  Breathing: *** Scar tissue: {Yes/No:304960894}*** Active Straight Leg Raise: ***                External Perineal Exam: ***                             Internal Pelvic Floor: ***  Patient confirms identification and approves PT to assess internal pelvic floor and treatment {yes/no:20286} All internal or external pelvic floor assessments and/or treatments are completed with proper hand hygiene and gloves hands. If needed gloves are changed with hand hygiene during patient care time.  PELVIC MMT:   MMT eval  Vaginal   Internal Anal Sphincter   External Anal Sphincter   Puborectalis   (Blank rows = not tested)        TONE: ***  PROLAPSE: ***  TODAY'S TREATMENT:  DATE: ***  EVAL ***   PATIENT EDUCATION:  Education details: *** Person educated: {Person educated:25204} Education method: {Education Method:25205} Education comprehension: {Education Comprehension:25206}  HOME EXERCISE PROGRAM: ***  ASSESSMENT:  CLINICAL IMPRESSION: Patient is a *** y.o. *** who was seen today for physical therapy evaluation and treatment for ***.   OBJECTIVE IMPAIRMENTS: {opptimpairments:25111}.   ACTIVITY LIMITATIONS: {activitylimitations:27494}  PARTICIPATION LIMITATIONS: {participationrestrictions:25113}  PERSONAL FACTORS: {Personal factors:25162} are also affecting patient's functional outcome.   REHAB POTENTIAL: {rehabpotential:25112}  CLINICAL DECISION MAKING: {clinical decision making:25114}  EVALUATION COMPLEXITY: {Evaluation complexity:25115}   GOALS: Goals reviewed with patient? {yes/no:20286}  SHORT TERM GOALS: Target date: ***  *** Baseline: Goal status: INITIAL  2.  *** Baseline:  Goal status: INITIAL  3.  *** Baseline:  Goal status: INITIAL  4.  *** Baseline:  Goal status: INITIAL  5.  *** Baseline:  Goal status: INITIAL  6.  *** Baseline:  Goal status: INITIAL  LONG TERM GOALS: Target date: ***  *** Baseline:  Goal status: INITIAL  2.  *** Baseline:  Goal status: INITIAL  3.  *** Baseline:  Goal status: INITIAL  4.  *** Baseline:  Goal status: INITIAL  5.  *** Baseline:  Goal status: INITIAL  6.  *** Baseline:  Goal status: INITIAL  PLAN:  PT FREQUENCY: {rehab frequency:25116}  PT DURATION: {rehab duration:25117}  PLANNED INTERVENTIONS: {rehab planned interventions:25118::97110-Therapeutic exercises,97530- Therapeutic [phone]- Neuromuscular re-education,97535- Self Rjmz,02859- Manual therapy,Patient/Family education}  PLAN FOR NEXT SESSION: ***   Stepahnie Campo, PT 05/08/2024, 5:40 PM

## 2024-05-09 ENCOUNTER — Ambulatory Visit
Admission: RE | Admit: 2024-05-09 | Discharge: 2024-05-09 | Disposition: A | Discharge status: Home / Self Care | Source: Ambulatory Visit | Attending: General Surgery | Admitting: General Surgery

## 2024-05-09 ENCOUNTER — Ambulatory Visit: Admitting: Physical Therapy

## 2024-05-09 DIAGNOSIS — K432 Incisional hernia without obstruction or gangrene: Secondary | ICD-10-CM

## 2024-05-09 MED ORDER — IOPAMIDOL (ISOVUE-300) INJECTION 61%
100.0000 mL | Freq: Once | INTRAVENOUS | Status: AC | PRN
  Administered 2024-05-09: 100 mL via INTRAVENOUS

## 2024-05-16 ENCOUNTER — Ambulatory Visit: Admitting: Licensed Clinical Social Worker

## 2024-05-16 DIAGNOSIS — F332 Major depressive disorder, recurrent severe without psychotic features: Secondary | ICD-10-CM

## 2024-05-16 DIAGNOSIS — F411 Generalized anxiety disorder: Secondary | ICD-10-CM | POA: Diagnosis not present

## 2024-05-16 NOTE — BH Specialist Note (Signed)
 Integrated Behavioral Health via Telemedicine Visit  05/22/2024 Brandi [name] [id number]  Number of Integrated Behavioral Health Clinician visits: 1- Initial Visit  Session Start time: 1045   Session End time: 1152  Total time in minutes: 67    Referring Provider: Vannie HOWARD Patient/Family location: Home [facility] Provider location: Remote Office All persons participating in visit: Patient and [facility] Types of Service: Individual psychotherapy and Video visit  I connected with Brandi [name] and/or Brandi [name]'s patient via  Telephone or Engineer, Civil (consulting)  (Video is Caregility application) and verified that I am speaking with the correct person using two identifiers. Discussed confidentiality: Yes   I discussed the limitations of telemedicine and the availability of in person appointments.  Discussed there is a possibility of technology failure and discussed alternative modes of communication if that failure occurs.  I discussed that engaging in this telemedicine visit, they consent to the provision of behavioral healthcare and the services will be billed under their insurance.  Patient and/or legal guardian expressed understanding and consented to Telemedicine visit: Yes   Presenting Concerns: Patient and/or family reports the following symptoms/concerns: marital conflict, postpartum symptoms.  Duration of problem: Months; Severity of problem: moderate  Patient and/or Family's Strengths/Protective Factors: Concrete supports in place (healthy food, safe environments, etc.), Caregiver has knowledge of parenting & child development, and Parental Resilience  Goals Addressed: Patient will:  Reduce symptoms of: depression and stress   Increase knowledge and/or ability of: coping skills, healthy habits, self-management skills, and stress reduction   Demonstrate ability to: Increase healthy adjustment to current life circumstances and Increase  adequate support systems for patient/family  Progress towards Goals: Ongoing    Interventions: Interventions utilized:  Solution-Focused Strategies, Supportive Counseling, Psychoeducation and/or Health Education, Communication Skills, and Supportive Reflection Standardized Assessments completed: Edinburgh Postnatal Depression    Edinburgh Postnatal Depression Scale - 05/16/24 1104       Edinburgh Postnatal Depression Scale:  In the Past 7 Days   I have been able to laugh and see the funny side of things. 0    I have looked forward with enjoyment to things. 1    I have blamed myself unnecessarily when things went wrong. 1    I have been anxious or worried for no good reason. 0    I have felt scared or panicky for no good reason. 0    Things have been getting on top of me. 2    I have been so unhappy that I have had difficulty sleeping. 2    I have felt sad or miserable. 2    I have been so unhappy that I have been crying. 2    The thought of harming myself has occurred to me. 0    Edinburgh Postnatal Depression Scale Total 10          Patient and/or Family Response:The patient was present for today's virtual session and processed her pregnancy and postpartum experiences, noting that her current pregnancy has been difficult as she became pregnant again only 2-3 months after her previous postpartum period. She has children ages 2 years, 1 year, and 2 months and described significant stress related to identifying the right formula for her infant and managing her overall home environment. The patient continues to grieve the loss of her mother, who passed away a year ago and was a major source of emotional and practical support; she reports having limited support since this loss. She stated she has been diagnosed with depression and  anxiety and remains compliant with her daily medications. The patient described increased marital conflict and expressed concerns about her husband's parenting  style, stating that she feels he is not meeting their children's mental, emotional, or verbal needs. 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 The session focused on exploring supportive options outside the home (e.g., nanny, hourly drop-in daycare) and identifying ways to create a "village" of support. She collaborated with the [facility] on strategies for having effective conversations with her husband about their upbringings, cultural influences, parenting expectations, and discipline approaches. The Edinburgh Postnatal Depression Scale was completed during the session, and results were reviewed.   Clinical Assessment/Diagnosis  No diagnosis found.    Assessment: Patient currently experiencing emotional strain related to a challenging pregnancy, limited support, ongoing grief for her mother, and increased marital conflict, along with feelings of overstimulation as a stay-at-home parent to three young children. She continues to manage symptoms of depression and anxiety while trying to stabilize her home environment..   Patient may benefit from continued support of integrated behavioral health services. .  Plan: Follow up with behavioral health clinician on : 05/23/2024 Behavioral recommendations: patient to continue engaging in therapy to support grief processing, communication skills within her marriage, and coping strategies for overstimulation and postpartum stress. Utilizing additional support systems--such as drop-in childcare, nanny services, or community resources--may help reduce symptoms and improve emotional well-being. Referral(s): Integrated [employer] (In Clinic)  I discussed the assessment and treatment plan with the patient and/or parent/guardian. They were provided an opportunity to ask questions and all were answered. They agreed with the plan and demonstrated an understanding of the instructions.   They were advised to call back or seek an  in-person evaluation if the symptoms worsen or if the condition fails to improve as anticipated.  Neima Lacross LITTIE Seats, LCSWA

## 2024-05-23 ENCOUNTER — Ambulatory Visit (INDEPENDENT_AMBULATORY_CARE_PROVIDER_SITE_OTHER): Admitting: Licensed Clinical Social Worker

## 2024-05-23 DIAGNOSIS — F332 Major depressive disorder, recurrent severe without psychotic features: Secondary | ICD-10-CM | POA: Diagnosis not present

## 2024-05-23 DIAGNOSIS — F411 Generalized anxiety disorder: Secondary | ICD-10-CM | POA: Diagnosis not present

## 2024-05-23 NOTE — BH Specialist Note (Unsigned)
 Integrated Behavioral Health via Telemedicine Visit  05/27/2024 Brandi [name] [id number]  Number of Integrated Behavioral Health Clinician visits: 2- Second Visit  Session Start time: 0815   Session End time: 0901  Total time in minutes: 46    Referring Provider: Vannie [name] Patient/Family location: Home Brandi [name] Provider location: Remote Office All persons participating in visit: Patient and Brandi [name] Types of Service: Individual psychotherapy and Video visit  [name] connected with Brandi [name] and/or Brandi [name]'s patient via  Telephone or Engineer, Civil (consulting)  (Video is Caregility application) and verified that [name] am speaking with the correct person using two identifiers. Discussed confidentiality: Yes   [name] discussed the limitations of telemedicine and the availability of in person appointments.  Discussed there is a possibility of technology failure and discussed alternative modes of communication if that failure occurs.  [name] discussed that engaging in this telemedicine visit, they consent to the provision of behavioral healthcare and the services will be billed under their insurance.  Patient and/or legal guardian expressed understanding and consented to Telemedicine visit: Yes   Presenting Concerns: Patient and/or family reports the following symptoms/concerns: increased and ongoing postpartum symptoms.  Duration of problem: Months; Severity of problem: moderate  Patient and/or Family's Strengths/Protective Factors: Social and Emotional competence, Concrete supports in place (healthy food, safe environments, etc.), Physical Health (exercise, healthy diet, medication compliance, etc.), and Caregiver has knowledge of parenting & child development  Goals Addressed: Patient will:  Reduce symptoms of: anxiety and depression   Increase knowledge and/or ability of: coping skills, healthy habits, and self-management skills   Demonstrate ability to:  Increase healthy adjustment to current life circumstances and Increase adequate support systems for patient/family  Progress towards Goals: Ongoing    Interventions: Interventions utilized:  Mindfulness or Management Consultant, Supportive Counseling, Psychoeducation and/or Health Education, Communication Skills, and Supportive Reflection Standardized Assessments completed: Not Needed    Patient and/or Family Response: The patient attended today's virtual session and discussed ongoing challenges in her marriage, particularly around differences in parenting and her husband's discipline methods. She explored her postpartum experience and acknowledged the differing ways mothers and fathers adjust during this period. Together, we identified communication strategies, opportunities for one-on-one connection with her husband, and ways to better understand each other's cultural backgrounds, beliefs, and values. The patient expressed openness to carving out uninterrupted personal time, such as sitting outside or in her car, and is also considering in-home nanny support to increase opportunities for quality time with her husband.    Clinical Assessment/Diagnosis  Generalized anxiety disorder  MDD (major depressive disorder), recurrent severe, without psychosis (HCC)    Assessment: Patient currently experiencing marital strain related to differing parenting approaches and the adjustments both partners are making during the postpartum period. She is also navigating a need for personal space and support while seeking healthier communication and connection within her marriage..   Patient may benefit from continued support of integrated behavioral health services.  Plan: Follow up with behavioral health clinician on : 06/06/2024 Behavioral recommendations: Patient to continue to practice open communication with her husband and exploring strategies that support shared understanding of parenting and  cultural differences. Additionally, incorporating regular personal time and considering supplemental support, such as an in-home nanny, may help reduce stress and strengthen the marital relationship. Referral(s): Integrated [employer] (In Clinic)  [name] discussed the assessment and treatment plan with the patient and/or parent/guardian. They were provided an opportunity to ask questions and all were answered. They agreed with the plan and  demonstrated an understanding of the instructions.   They were advised to call back or seek an in-person evaluation if the symptoms worsen or if the condition fails to improve as anticipated.  Brandi [name], LCSWA

## 2024-05-28 ENCOUNTER — Ambulatory Visit: Admitting: Certified Nurse Midwife

## 2024-06-06 ENCOUNTER — Encounter: Admitting: Licensed Clinical Social Worker

## 2024-06-13 ENCOUNTER — Ambulatory Visit (INDEPENDENT_AMBULATORY_CARE_PROVIDER_SITE_OTHER): Admitting: Licensed Clinical Social Worker

## 2024-06-13 DIAGNOSIS — F411 Generalized anxiety disorder: Secondary | ICD-10-CM

## 2024-06-13 NOTE — BH Specialist Note (Unsigned)
 Integrated Behavioral Health via Telemedicine Visit  06/17/2024 Brandi [name] [id number]  Number of Integrated Behavioral Health Clinician visits: 3- Third Visit  Session Start time: 0915   Session End time: 1042  Total time in minutes: 87    Referring Provider: Vannie HOWARD Patient/Family location: Home [facility] Provider location: Remote Office All persons participating in visit: Patient and [facility] Types of Service: Individual psychotherapy and Video visit  I connected with Katori Poland and/or Baylie Blizzard's patient via  Telephone or Engineer, Civil (consulting)  (Video is Caregility application) and verified that I am speaking with the correct person using two identifiers. Discussed confidentiality: Yes   I discussed the limitations of telemedicine and the availability of in person appointments.  Discussed there is a possibility of technology failure and discussed alternative modes of communication if that failure occurs.  I discussed that engaging in this telemedicine visit, they consent to the provision of behavioral healthcare and the services will be billed under their insurance.  Patient and/or legal guardian expressed understanding and consented to Telemedicine visit: Yes   Presenting Concerns: Patient and/or family reports the following symptoms/concerns: ongoing marital conflict.  Duration of problem: months; Severity of problem: moderate  Patient and/or Family's Strengths/Protective Factors: Social and Emotional competence, Concrete supports in place (healthy food, safe environments, etc.), Caregiver has knowledge of parenting & child development, and Parental Resilience  Goals Addressed: Patient will:  Reduce symptoms of: anxiety and depression   Increase knowledge and/or ability of: coping skills, healthy habits, and self-management skills   Demonstrate ability to: Increase healthy adjustment to current life circumstances and Increase  adequate support systems for patient/family  Progress towards Goals: Ongoing    Interventions: Interventions utilized:  Mindfulness or Relaxation Training, Supportive Counseling, Psychoeducation and/or Health Education, Communication Skills, and Supportive Reflection Standardized Assessments completed: Not Needed    Patient and/or Family Response: winter fest last week fun Every week,day its exhasting.. interactions with 29 year old.. triggered her. The innerchild in her..   Clinical Assessment/Diagnosis  Generalized anxiety disorder    Assessment: Patient currently experiencing ***.   Patient may benefit from continued support of integrated behavioral health services.  Plan: Follow up with behavioral health clinician on : *** Behavioral recommendations: *** Referral(s): Integrated [employer] (In Clinic)  I discussed the assessment and treatment plan with the patient and/or parent/guardian. They were provided an opportunity to ask questions and all were answered. They agreed with the plan and demonstrated an understanding of the instructions.   They were advised to call back or seek an in-person evaluation if the symptoms worsen or if the condition fails to improve as anticipated.  Tearia Gibbs LITTIE Seats, LCSWA

## 2024-07-04 ENCOUNTER — Ambulatory Visit (INDEPENDENT_AMBULATORY_CARE_PROVIDER_SITE_OTHER): Payer: Self-pay | Admitting: Licensed Clinical Social Worker

## 2024-07-04 DIAGNOSIS — F332 Major depressive disorder, recurrent severe without psychotic features: Secondary | ICD-10-CM

## 2024-07-04 NOTE — BH Specialist Note (Signed)
 "   Integrated Behavioral Health via Telemedicine Visit  07/08/2024 Brandi [name] [id number]  Number of Integrated Behavioral Health Clinician visits: 4- Fourth Visit  Session Start time: 0815   Session End time: 0941  Total time in minutes: 86    Referring Provider: Vannie HOWARD Patient/Family location: Home [facility] Provider location: Remote Office All persons participating in visit: Patient and [facility] Types of Service: Individual psychotherapy and Video visit  I connected with Brandi [name] and/or Brandi [name]'s patient via  Telephone or Engineer, Civil (consulting)  (Video is Caregility application) and verified that I am speaking with the correct person using two identifiers. Discussed confidentiality: Yes   I discussed the limitations of telemedicine and the availability of in person appointments.  Discussed there is a possibility of technology failure and discussed alternative modes of communication if that failure occurs.  I discussed that engaging in this telemedicine visit, they consent to the provision of behavioral healthcare and the services will be billed under their insurance.  Patient and/or legal guardian expressed understanding and consented to Telemedicine visit: Yes   Presenting Concerns: Patient and/or family reports the following symptoms/concerns: increased depressive symptoms.  Duration of problem: Months; Severity of problem: severe  Patient and/or Family's Strengths/Protective Factors: Social and Emotional competence, Concrete supports in place (healthy food, safe environments, etc.), Physical Health (exercise, healthy diet, medication compliance, etc.), and Caregiver has knowledge of parenting & child development  Goals Addressed: Patient will:  Reduce symptoms of: anxiety and depression   Increase knowledge and/or ability of: coping skills, healthy habits, and self-management skills   Demonstrate ability to: Increase healthy  adjustment to current life circumstances and Increase adequate support systems for patient/family  Progress towards Goals: Ongoing    Interventions: Interventions utilized:  Mindfulness or Management Consultant, Supportive Counseling, Psychoeducation and/or Health Education, Communication Skills, and Supportive Reflection Standardized Assessments completed: Not Needed    Patient and/or Family Response: The patient was present for todays virtual session and reported enjoying Christmas, specifically noting watching her children open their gifts and receiving a cabin as a gift. She provided an update regarding a recent behavioral health admission related to self-harm and processed feelings of disappointment and distress related to perceived lack of support and understanding from law enforcement when she sought assistance. The patient explored contributing stressors leading up to the incident, including her sister moving out of the shared home without communication or financial contribution, which resulted in the patient and her family relocating to [place] to live with her husbands family. She reported ongoing family conflict, noting her sister stated she could no longer reside with the patient and her husband due to persistent arguments. The patient reported continued medication adherence and stated she feels the medication is effective. She identified a shift in focus toward prioritizing herself and her children, as well as increasing financial stability through content creation and photography. She plans to spend more time with a friend to gain photography skills and identified behavioral strategies to support her mental health, including leaving the house more frequently, going for walks, and taking breaks in her car.   Clinical Assessment/Diagnosis  MDD (major depressive disorder), recurrent severe, without psychosis (HCC)    Assessment: Patient currently experiencing emotional distress  related to recent family conflict, housing instability, and feelings of disappointment following a behavioral health admission, though she reports stabilization with current medication. She is actively shifting focus toward self-care, her children, and rebuilding stability through personal and financial goals.   Patient may benefit from  continued support of integrated behavioral health services.  Plan: Follow up with behavioral health clinician on : 07/12/2023 Behavioral recommendations: include continued medication adherence and engagement in therapy to support emotional regulation and recovery following recent stressors. The patient is encouraged to maintain self-care routines, increase healthy coping activities such as walking and taking breaks, and strengthen social and supportive connections while pursuing personal and financial goals. Also consider support groups from [facility]. [employer].  Referral(s): Integrated [employer] (In Clinic)  I discussed the assessment and treatment plan with the patient and/or parent/guardian. They were provided an opportunity to ask questions and all were answered. They agreed with the plan and demonstrated an understanding of the instructions.   They were advised to call back or seek an in-person evaluation if the symptoms worsen or if the condition fails to improve as anticipated.  Brandi [name], LCSWA "

## 2024-07-11 ENCOUNTER — Ambulatory Visit: Payer: Self-pay | Admitting: Licensed Clinical Social Worker

## 2024-07-11 DIAGNOSIS — F332 Major depressive disorder, recurrent severe without psychotic features: Secondary | ICD-10-CM | POA: Diagnosis not present

## 2024-07-11 NOTE — BH Specialist Note (Unsigned)
"     Integrated Behavioral Health via Telemedicine Visit  07/11/2024 Brandi [name] [id number]  Number of Integrated Behavioral Health Clinician visits: 4- Fourth Visit  Session Start time: 0815   Session End time: 0941  Total time in minutes: 86    Referring Provider: Vannie HOWARD Patient/Family location: Home [facility] Provider location: Remote Office All persons participating in visit: Patient and Brandi [name] Types of Service: Individual psychotherapy and Video visit  I connected with Brandi [name] and/or Brandi [name]'s patient via  Telephone or Engineer, Civil (consulting)  (Video is Caregility application) and verified that I am speaking with the correct person using two identifiers. Discussed confidentiality: Yes   I discussed the limitations of telemedicine and the availability of in person appointments.  Discussed there is a possibility of technology failure and discussed alternative modes of communication if that failure occurs.  I discussed that engaging in this telemedicine visit, they consent to the provision of behavioral healthcare and the services will be billed under their insurance.  Patient and/or legal guardian expressed understanding and consented to Telemedicine visit: Yes   Presenting Concerns: Patient and/or family reports the following symptoms/concerns: *** Duration of problem: Months; Severity of problem: moderate  Patient and/or Family's Strengths/Protective Factors: Social and Emotional competence, Concrete supports in place (healthy food, safe environments, etc.), Caregiver has knowledge of parenting & child development, and Parental Resilience  Goals Addressed: Patient will:  Reduce symptoms of: anxiety and depression   Increase knowledge and/or ability of: coping skills, healthy habits, and self-management skills   Demonstrate ability to: Increase healthy adjustment to current life circumstances and Increase adequate support systems  for patient/family  Progress towards Goals: Ongoing    Interventions: Interventions utilized:  Mindfulness or Management Consultant, Supportive Counseling, Psychoeducation and/or Health Education, Communication Skills, and Supportive Reflection Standardized Assessments completed: Not Needed    Patient and/or Family Response: Monday was husband's first day back to work and he has to drive 1 hr 25 min to and from work.   Nervous because her kids are not at home and she had them on a routine at ome.   Monday was great. Got a shower and coffee before the kids got up. Which was a peak.  Lows: 30 year old is definitely taking this hard. He's acting out and being aggressive. She's the only one informed of the behaviors of children and how to deal with this and husband havent educated this and it feels heavy to deal with alone.   Clinical Assessment/Diagnosis  No diagnosis found.    Assessment: Patient currently experiencing ***.   Patient may benefit from continued support of integrated behavioral health services.  Plan: Follow up with behavioral health clinician on : *** Behavioral recommendations: *** Referral(s): Integrated Brandi [name] (In Clinic)  I discussed the assessment and treatment plan with the patient and/or parent/guardian. They were provided an opportunity to ask questions and all were answered. They agreed with the plan and demonstrated an understanding of the instructions.   They were advised to call back or seek an in-person evaluation if the symptoms worsen or if the condition fails to improve as anticipated.  Man Effertz LITTIE Seats, LCSWA "

## 2024-07-18 ENCOUNTER — Ambulatory Visit: Admitting: Physical Therapy

## 2024-07-23 ENCOUNTER — Ambulatory Visit: Admitting: Certified Nurse Midwife

## 2024-07-25 ENCOUNTER — Ambulatory Visit (INDEPENDENT_AMBULATORY_CARE_PROVIDER_SITE_OTHER): Payer: Self-pay | Admitting: Licensed Clinical Social Worker

## 2024-07-25 DIAGNOSIS — F332 Major depressive disorder, recurrent severe without psychotic features: Secondary | ICD-10-CM | POA: Diagnosis not present

## 2024-07-25 NOTE — BH Specialist Note (Signed)
 "   Integrated Behavioral Health via Telemedicine Visit  07/31/2024 Brandi [name] [id number]  Number of Integrated Behavioral Health Clinician visits: 6-Sixth Visit  Session Start time: 0915   Session End time: 1020  Total time in minutes: 65    Referring Provider: Vannie HOWARD Patient/Family location: Home [facility] Provider location: Remote Office All persons participating in visit: Patient and [facility] Types of Service: Individual psychotherapy and Video visit  I connected with Olive Mckiver and/or Deissy Thall's patient via  Telephone or Engineer, Civil (consulting)  (Video is Caregility application) and verified that I am speaking with the correct person using two identifiers. Discussed confidentiality: Yes   I discussed the limitations of telemedicine and the availability of in person appointments.  Discussed there is a possibility of technology failure and discussed alternative modes of communication if that failure occurs.  I discussed that engaging in this telemedicine visit, they consent to the provision of behavioral healthcare and the services will be billed under their insurance.  Patient and/or legal guardian expressed understanding and consented to Telemedicine visit: Yes   Presenting Concerns: Patient and/or family reports the following symptoms/concerns: Ongoing mood symptoms. Duration of problem: Months; Severity of problem: moderate  Patient and/or Family's Strengths/Protective Factors: Social and Emotional competence, Concrete supports in place (healthy food, safe environments, etc.), Physical Health (exercise, healthy diet, medication compliance, etc.), Caregiver has knowledge of parenting & child development, and Parental Resilience  Goals Addressed: Patient will:  Reduce symptoms of: anxiety and depression   Increase knowledge and/or ability of: coping skills, healthy habits, and self-management skills   Demonstrate ability to: Increase  healthy adjustment to current life circumstances and Increase adequate support systems for patient/family  Progress towards Goals: Ongoing    Interventions: Interventions utilized:  Mindfulness or Management Consultant, Supportive Counseling, Psychoeducation and/or Health Education, and Supportive Reflection Standardized Assessments completed: Not Needed    Patient and/or Family Response: Patient was present for todays virtual session and expressed relief and positive feelings regarding her husband obtaining a new job with a promotion, noting the position is closer to their new home and reduces travel-related stress. Patient also processed ongoing relational stress, reporting feelings of frustration and discomfort related to perceived prioritization of extended family over herself and her children. She described a recent household conflict in which her toddler accidentally damaged her brother-in-laws Xbox, resulting in tension within the home; patient expressed disagreement with expectations placed on her and feelings that the environment may not be emotionally safe or supportive for her children. Patient reported feeling unsettled by the situation and concerned about long-term living arrangements and family boundaries.  Clinical Assessment/Diagnosis  MDD (major depressive disorder), recurrent severe, without psychosis (HCC)    Assessment: Patient currently experiencing mixed emotions, including relief related to improved financial and logistical stability and frustration and discomfort stemming from family conflict and boundary concerns. She reports feeling protective of her children and distressed by household tension..   Patient may benefit from continued support of integrated behavioral health services.  Plan: Follow up with behavioral health clinician on : 08/07/2024 Behavioral recommendations: include encouraging the patient to continue exploring assertive communication and  boundary-setting with her husband regarding family dynamics and shared expectations. Patient is also encouraged to process feelings of discomfort and prioritize emotional safety for herself and her children while utilizing coping strategies to manage relational stress. Referral(s): Integrated [employer] (In Clinic)  I discussed the assessment and treatment plan with the patient and/or parent/guardian. They were provided an opportunity to  ask questions and all were answered. They agreed with the plan and demonstrated an understanding of the instructions.   They were advised to call back or seek an in-person evaluation if the symptoms worsen or if the condition fails to improve as anticipated.  Aijalon Demuro LITTIE Seats, LCSWA "

## 2024-08-07 ENCOUNTER — Encounter: Payer: Self-pay | Admitting: Licensed Clinical Social Worker

## 2024-08-14 ENCOUNTER — Encounter: Admitting: Licensed Clinical Social Worker
